# Patient Record
Sex: Male | Born: 1960 | Race: Black or African American | Hispanic: No | Marital: Single | Smoking: Current every day smoker
Health system: Southern US, Community
[De-identification: ages and names within clinical notes are randomized; demographics above are authoritative.]

## PROBLEM LIST (undated history)

## (undated) DIAGNOSIS — R079 Chest pain, unspecified: Secondary | ICD-10-CM

## (undated) DIAGNOSIS — N186 End stage renal disease: Secondary | ICD-10-CM

## (undated) DIAGNOSIS — R9431 Abnormal electrocardiogram [ECG] [EKG]: Secondary | ICD-10-CM

## (undated) DIAGNOSIS — I10 Essential (primary) hypertension: Secondary | ICD-10-CM

## (undated) DIAGNOSIS — F1910 Other psychoactive substance abuse, uncomplicated: Secondary | ICD-10-CM

## (undated) DIAGNOSIS — I517 Cardiomegaly: Secondary | ICD-10-CM

## (undated) DIAGNOSIS — E875 Hyperkalemia: Secondary | ICD-10-CM

## (undated) DIAGNOSIS — M1990 Unspecified osteoarthritis, unspecified site: Secondary | ICD-10-CM

## (undated) HISTORY — PX: AV FISTULA PLACEMENT: SHX1204

## (undated) HISTORY — PX: CHOLECYSTECTOMY: SHX55

---

## 2002-06-06 ENCOUNTER — Emergency Department (HOSPITAL_COMMUNITY): Admission: EM | Admit: 2002-06-06 | Discharge: 2002-06-06 | Payer: Self-pay

## 2002-10-31 ENCOUNTER — Inpatient Hospital Stay (HOSPITAL_COMMUNITY): Admission: EM | Admit: 2002-10-31 | Discharge: 2002-11-01 | Payer: Self-pay | Admitting: Emergency Medicine

## 2002-10-31 ENCOUNTER — Encounter: Payer: Self-pay | Admitting: Internal Medicine

## 2002-11-01 ENCOUNTER — Encounter: Payer: Self-pay | Admitting: Internal Medicine

## 2003-03-12 ENCOUNTER — Encounter: Payer: Self-pay | Admitting: Emergency Medicine

## 2003-03-12 ENCOUNTER — Inpatient Hospital Stay (HOSPITAL_COMMUNITY): Admission: EM | Admit: 2003-03-12 | Discharge: 2003-03-14 | Payer: Self-pay | Admitting: Emergency Medicine

## 2003-03-13 ENCOUNTER — Encounter: Payer: Self-pay | Admitting: Cardiology

## 2003-03-23 ENCOUNTER — Encounter: Admission: RE | Admit: 2003-03-23 | Discharge: 2003-03-23 | Payer: Self-pay | Admitting: Family Medicine

## 2003-05-26 ENCOUNTER — Emergency Department (HOSPITAL_COMMUNITY): Admission: EM | Admit: 2003-05-26 | Discharge: 2003-05-26 | Payer: Self-pay | Admitting: *Deleted

## 2003-12-18 ENCOUNTER — Inpatient Hospital Stay (HOSPITAL_COMMUNITY): Admission: EM | Admit: 2003-12-18 | Discharge: 2003-12-22 | Payer: Self-pay | Admitting: Emergency Medicine

## 2004-08-31 ENCOUNTER — Ambulatory Visit: Payer: Self-pay | Admitting: Family Medicine

## 2004-08-31 ENCOUNTER — Ambulatory Visit: Payer: Self-pay | Admitting: *Deleted

## 2004-09-15 ENCOUNTER — Ambulatory Visit: Payer: Self-pay | Admitting: Family Medicine

## 2004-09-21 ENCOUNTER — Ambulatory Visit: Payer: Self-pay | Admitting: Family Medicine

## 2004-10-12 ENCOUNTER — Ambulatory Visit: Payer: Self-pay | Admitting: Family Medicine

## 2004-11-08 ENCOUNTER — Ambulatory Visit: Payer: Self-pay | Admitting: Family Medicine

## 2004-11-30 ENCOUNTER — Ambulatory Visit: Payer: Self-pay | Admitting: Family Medicine

## 2004-12-01 ENCOUNTER — Ambulatory Visit: Payer: Self-pay | Admitting: Family Medicine

## 2005-01-12 ENCOUNTER — Ambulatory Visit: Payer: Self-pay | Admitting: Family Medicine

## 2005-03-02 ENCOUNTER — Ambulatory Visit: Payer: Self-pay | Admitting: Family Medicine

## 2005-05-10 ENCOUNTER — Ambulatory Visit: Payer: Self-pay | Admitting: Family Medicine

## 2005-06-04 ENCOUNTER — Emergency Department (HOSPITAL_COMMUNITY): Admission: EM | Admit: 2005-06-04 | Discharge: 2005-06-05 | Payer: Self-pay | Admitting: Emergency Medicine

## 2005-06-06 ENCOUNTER — Ambulatory Visit: Payer: Self-pay | Admitting: Family Medicine

## 2005-09-11 ENCOUNTER — Ambulatory Visit: Admission: RE | Admit: 2005-09-11 | Discharge: 2005-09-11 | Payer: Self-pay | Admitting: Vascular Surgery

## 2005-09-13 ENCOUNTER — Encounter (INDEPENDENT_AMBULATORY_CARE_PROVIDER_SITE_OTHER): Payer: Self-pay | Admitting: Specialist

## 2005-09-13 ENCOUNTER — Inpatient Hospital Stay (HOSPITAL_COMMUNITY): Admission: EM | Admit: 2005-09-13 | Discharge: 2005-09-14 | Payer: Self-pay | Admitting: Emergency Medicine

## 2005-12-22 ENCOUNTER — Ambulatory Visit: Payer: Self-pay | Admitting: Family Medicine

## 2006-02-16 ENCOUNTER — Inpatient Hospital Stay (HOSPITAL_COMMUNITY): Admission: EM | Admit: 2006-02-16 | Discharge: 2006-02-20 | Payer: Self-pay | Admitting: Emergency Medicine

## 2006-02-16 ENCOUNTER — Ambulatory Visit: Payer: Self-pay | Admitting: Family Medicine

## 2006-02-19 ENCOUNTER — Encounter (INDEPENDENT_AMBULATORY_CARE_PROVIDER_SITE_OTHER): Payer: Self-pay | Admitting: Interventional Cardiology

## 2006-02-21 ENCOUNTER — Ambulatory Visit: Payer: Self-pay | Admitting: Family Medicine

## 2006-05-14 ENCOUNTER — Emergency Department (HOSPITAL_COMMUNITY): Admission: EM | Admit: 2006-05-14 | Discharge: 2006-05-14 | Payer: Self-pay | Admitting: Emergency Medicine

## 2006-05-23 ENCOUNTER — Ambulatory Visit: Payer: Self-pay | Admitting: Family Medicine

## 2006-06-10 IMAGING — CR DG CHEST 2V
2 series · 2 of 2 positions shown · non-contrast
Comparison: 02/16/06.

CLINICAL DATA: Chest pain, shortness of breath.  ER patient.
 CHEST ? 2 VIEW:

[w chest pa]
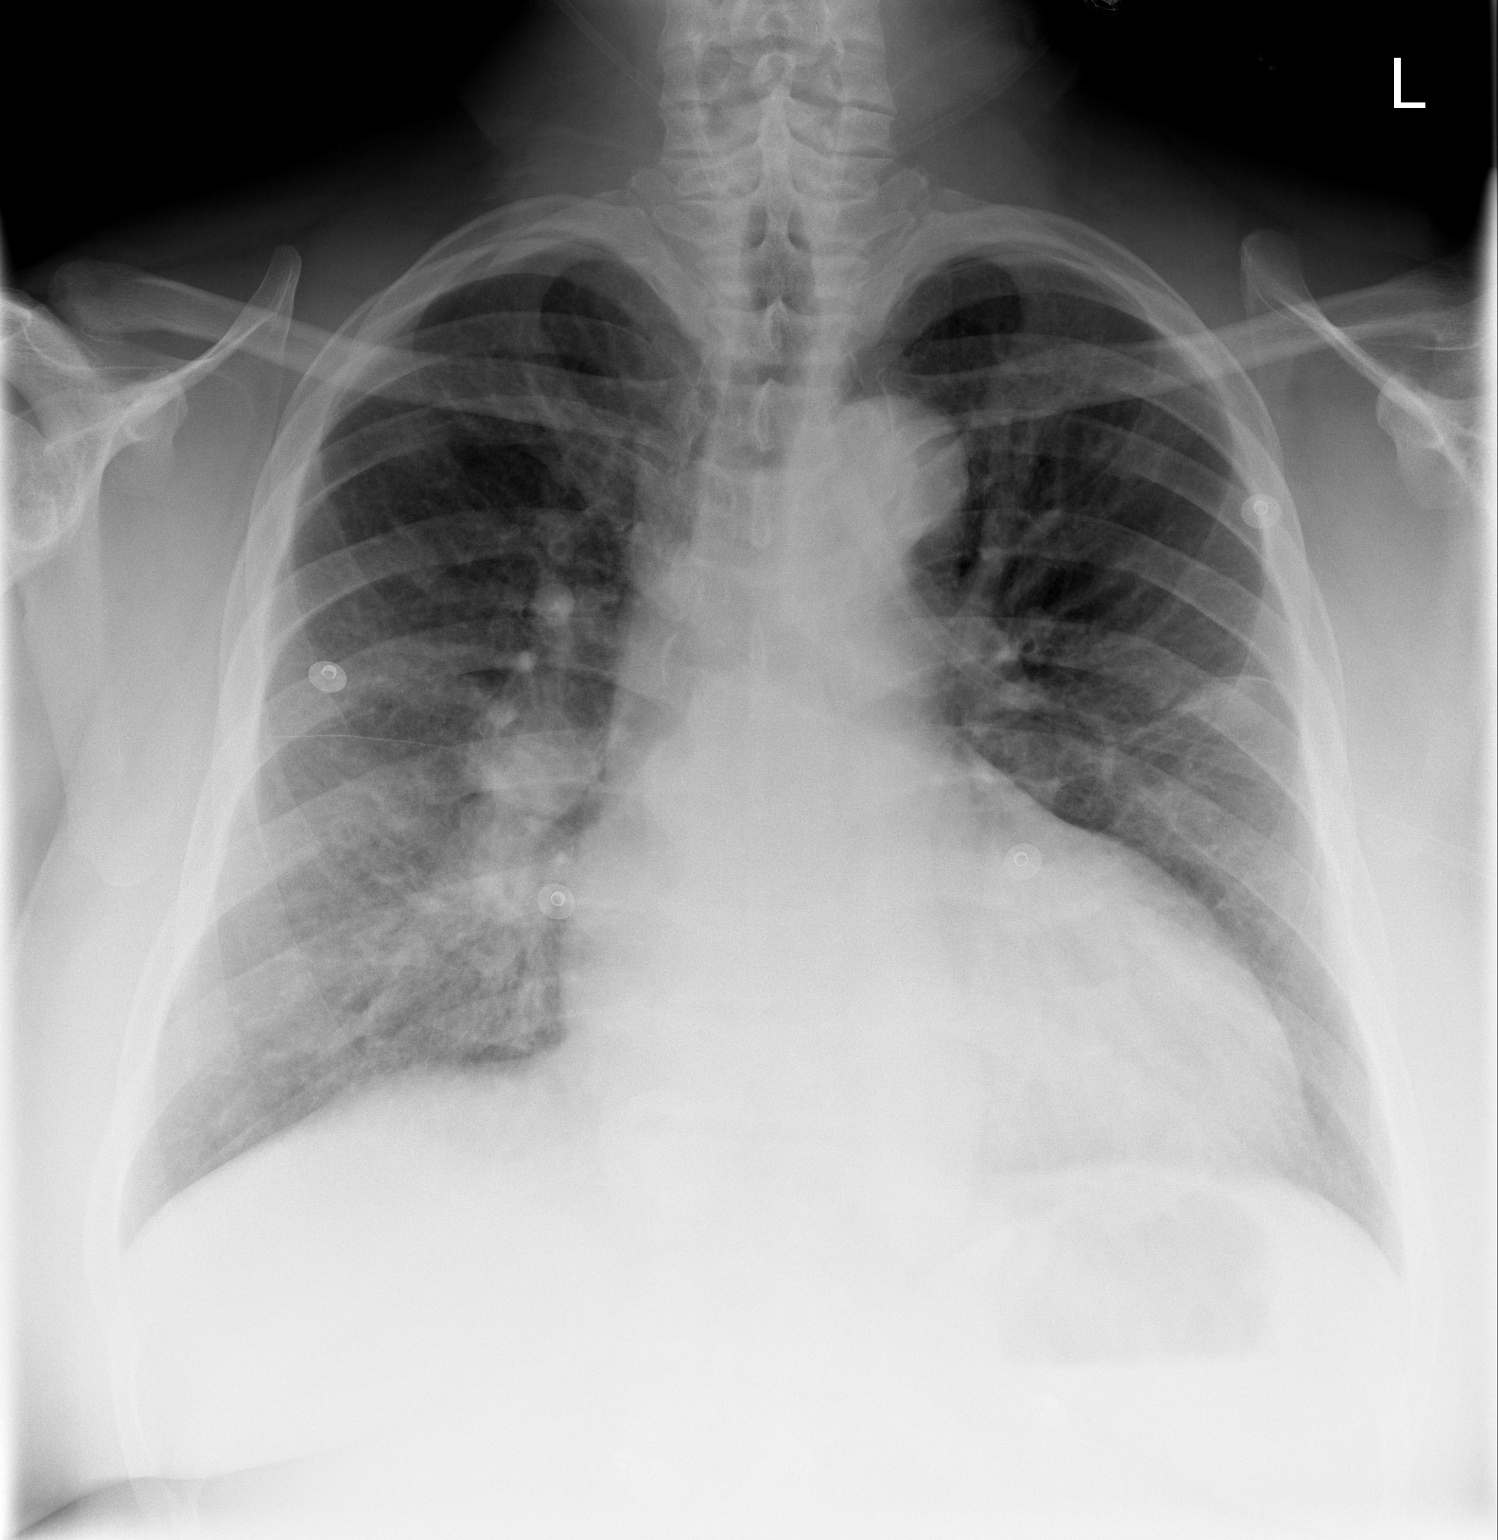

[w chest lat]
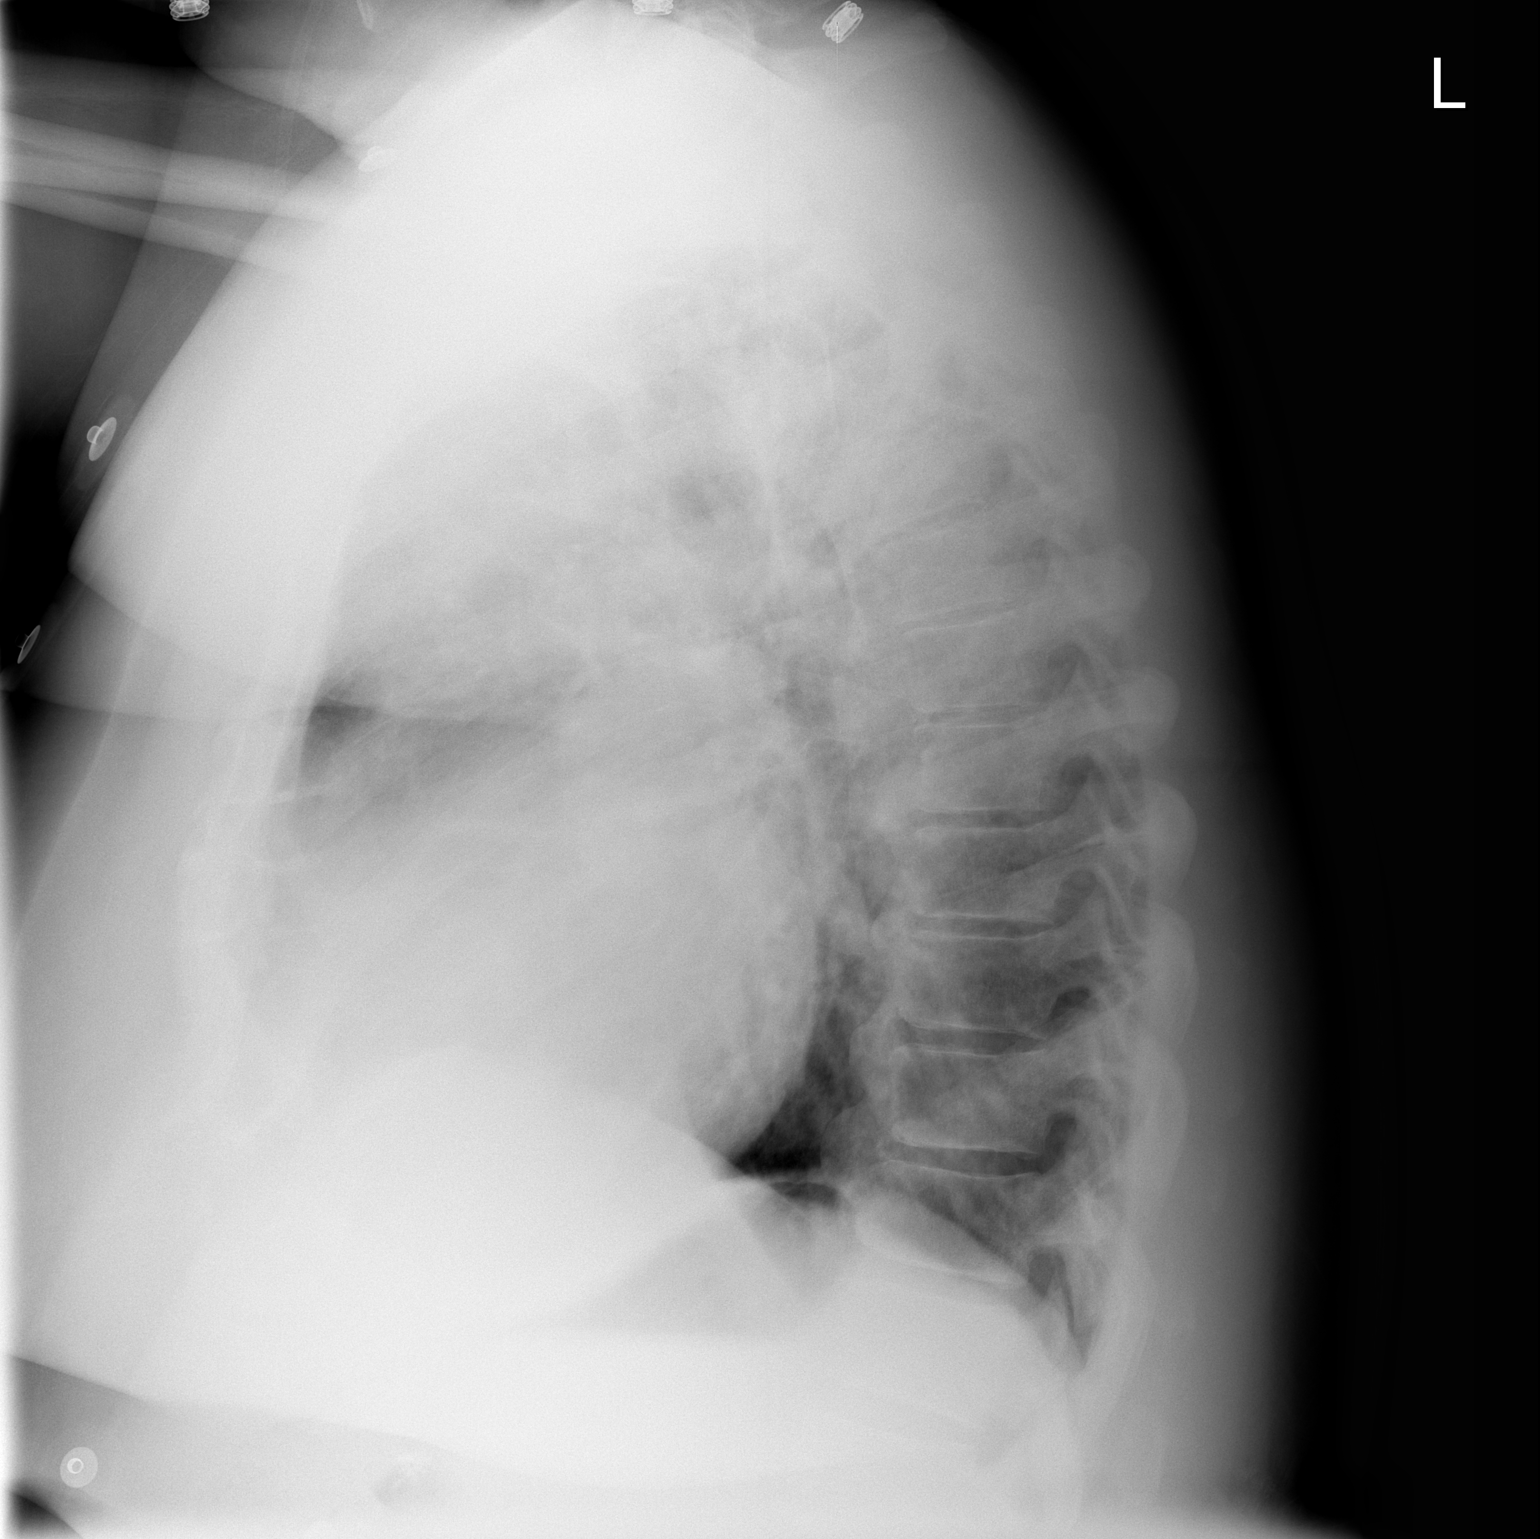

[2 of 2 positions shown; findings below may reference images not displayed]

FINDINGS: Cardiomegaly and pulmonary vascular congestion.  Minimal perivascular edema may be present.  Subsegmental atelectasis left versus scarring left mid lung zone.
IMPRESSION: Findings compatible with mild/incipient CHF.

## 2006-09-03 ENCOUNTER — Ambulatory Visit: Payer: Self-pay | Admitting: Family Medicine

## 2006-11-06 ENCOUNTER — Inpatient Hospital Stay (HOSPITAL_COMMUNITY): Admission: EM | Admit: 2006-11-06 | Discharge: 2006-11-09 | Payer: Self-pay | Admitting: Emergency Medicine

## 2007-05-03 ENCOUNTER — Ambulatory Visit (HOSPITAL_COMMUNITY): Admission: RE | Admit: 2007-05-03 | Discharge: 2007-05-03 | Payer: Self-pay | Admitting: Nephrology

## 2007-06-04 ENCOUNTER — Ambulatory Visit: Payer: Self-pay | Admitting: Vascular Surgery

## 2007-07-18 ENCOUNTER — Ambulatory Visit (HOSPITAL_COMMUNITY): Admission: RE | Admit: 2007-07-18 | Discharge: 2007-07-18 | Payer: Self-pay | Admitting: Nephrology

## 2007-08-23 ENCOUNTER — Ambulatory Visit: Payer: Self-pay | Admitting: Vascular Surgery

## 2007-08-27 ENCOUNTER — Ambulatory Visit (HOSPITAL_COMMUNITY): Admission: RE | Admit: 2007-08-27 | Discharge: 2007-08-27 | Payer: Self-pay | Admitting: Vascular Surgery

## 2007-08-27 ENCOUNTER — Ambulatory Visit: Payer: Self-pay | Admitting: Vascular Surgery

## 2007-09-11 ENCOUNTER — Encounter (INDEPENDENT_AMBULATORY_CARE_PROVIDER_SITE_OTHER): Payer: Self-pay | Admitting: *Deleted

## 2007-11-14 IMAGING — XA IR VENTRICULOPERITONEALSHUNT EVAL/INJ
1 series · 13 of 19 positions shown · non-contrast
Comparison: none

Addendum Begins
Patient returned for repeat injection. The fistula between the radial artery and
outflow cephalic vein is widely patent. Outflow vein is patent in the forearm.
The outflow vein bifurcates at the level of the elbow. Centrally, cephalic and
brachial outflow components are widely patent. The central venous structures are
unremarkable.
CLINICAL DATA: Difficult cannulation. Poor maturation.

[Series 1: run · 13 of 19 slices shown]
[im 1/19]
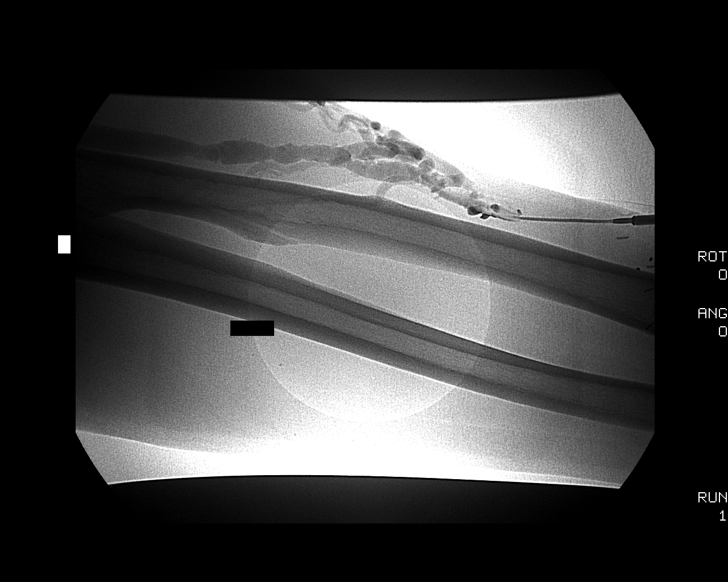
[im 3/19]
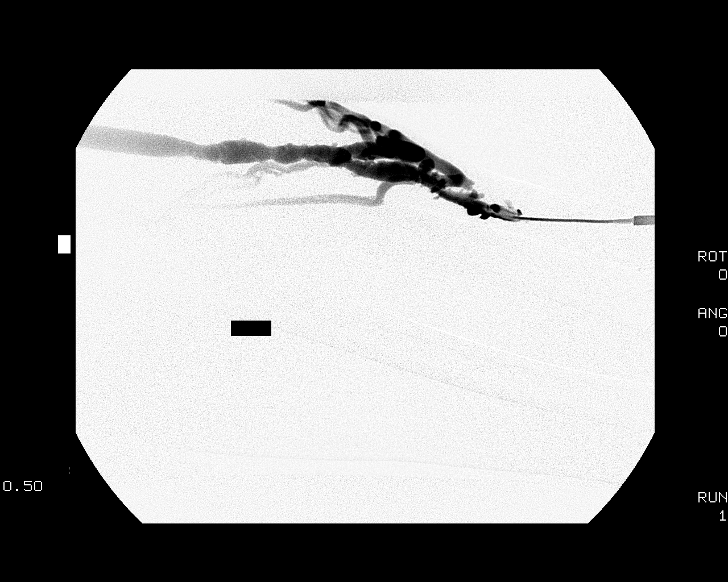
[im 4/19]
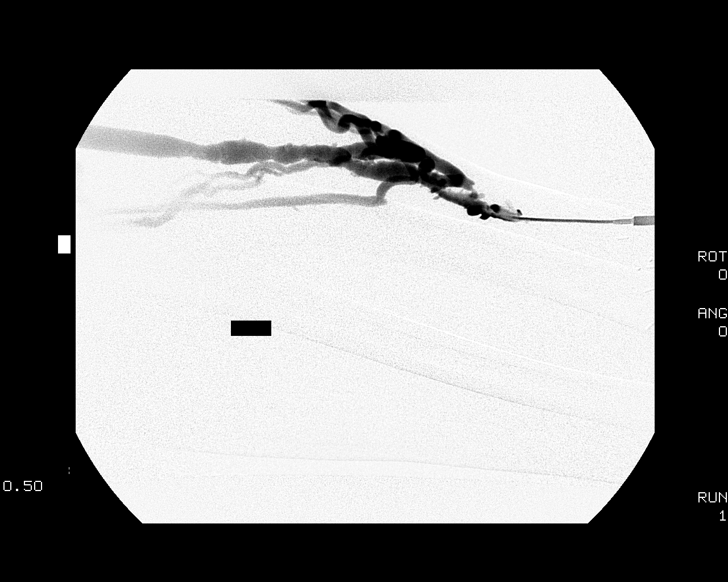
[im 6/19]
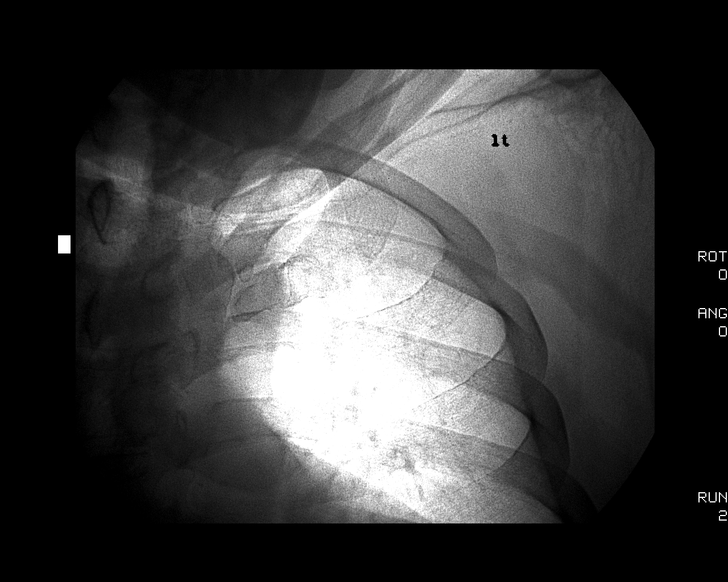
[im 7/19]
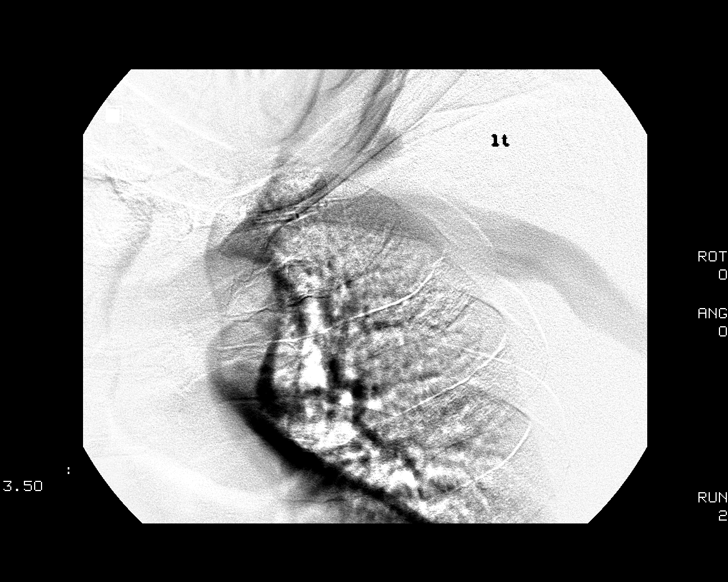
[im 9/19]
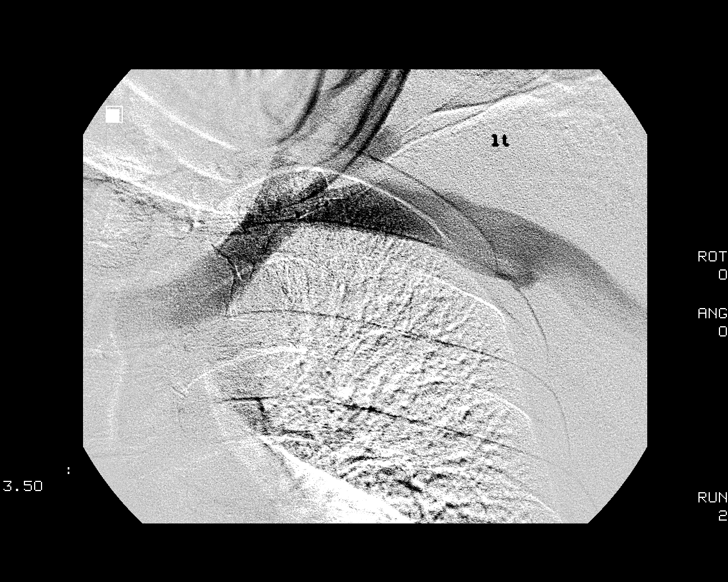
[im 10/19]
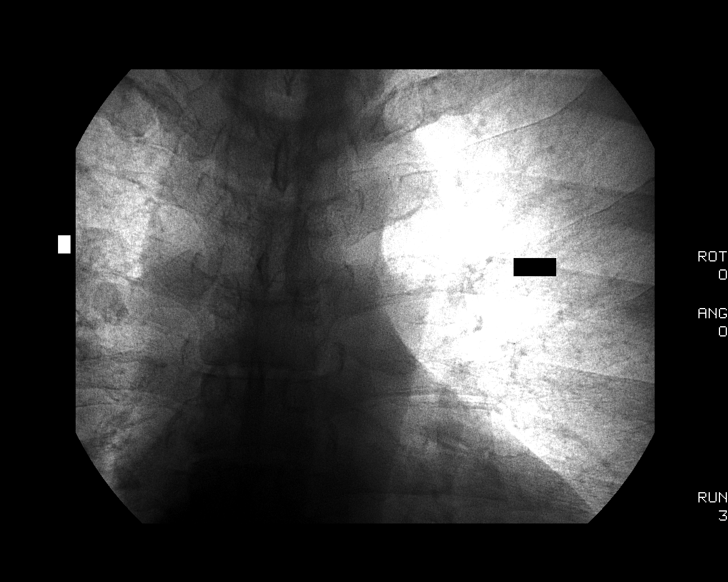
[im 11/19]
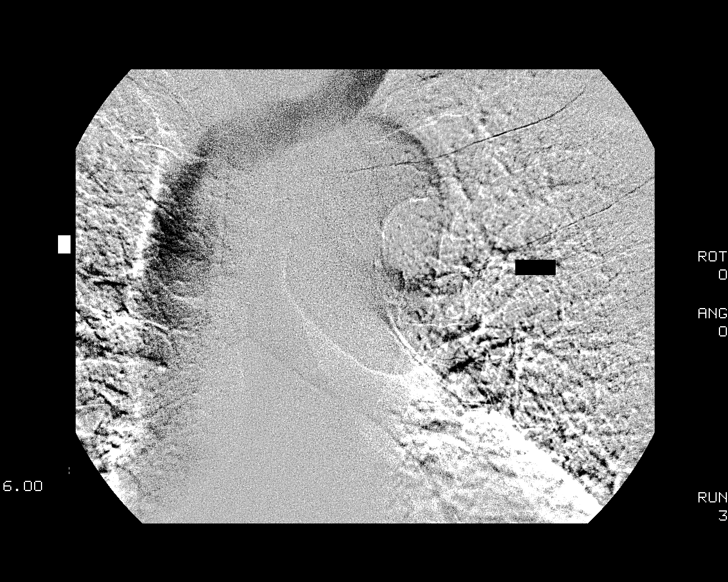
[im 13/19]
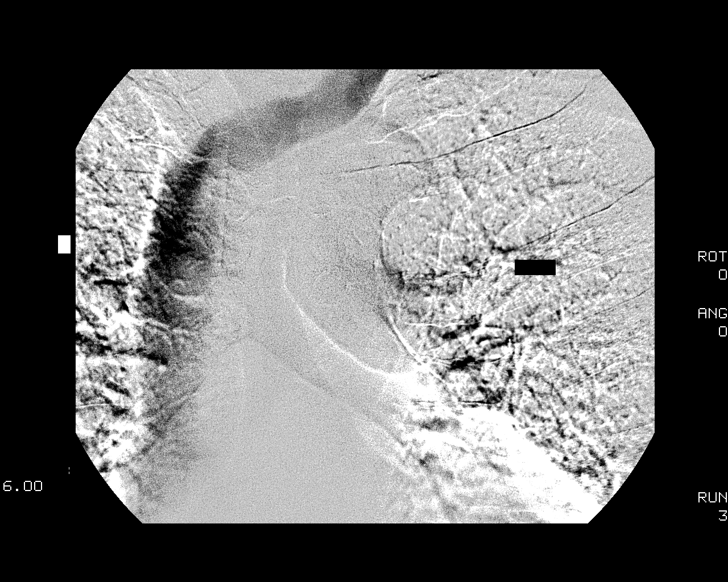
[im 14/19]
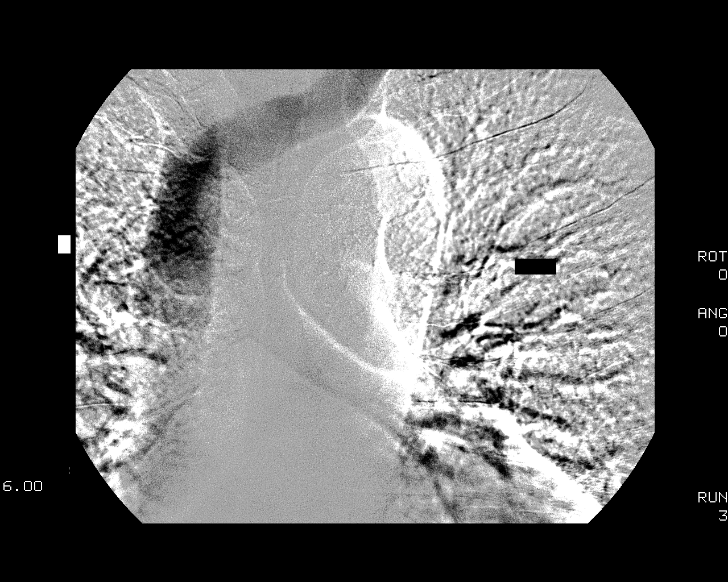
[im 16/19]
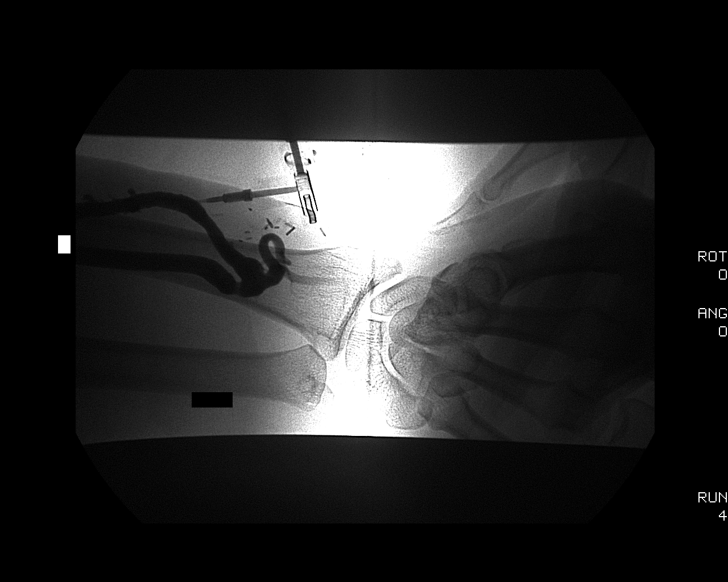
[im 17/19]
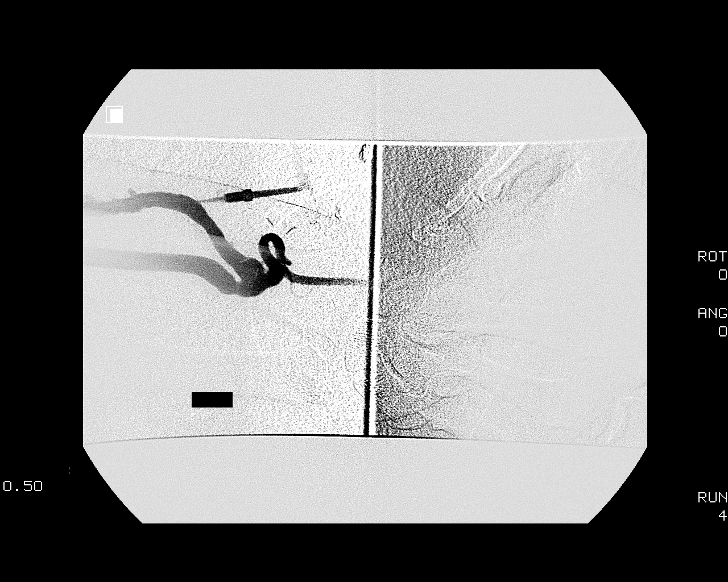
[im 19/19]
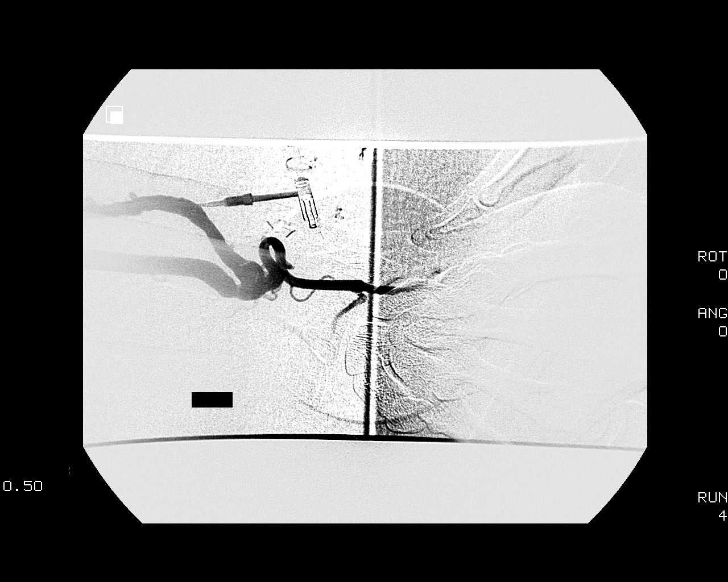

[13 of 19 positions shown; findings below may reference images not displayed]

IMPRESSION: 1. Patent left forearm fistula with no central venous stenosis or occlusion.
Addendum Ends
Dialysis fistulogram:

An 18-gauge angiocatheter was placed in outflow vein for fistulography.
Initially, there was a small amount of extravasation around the tip of the
angiocatheter which was repositioned. 
There is irregularity of the primary outflow   forearm vein. Vein bifurcates at
the level of the elbow into cephalic and basilic components which are widely
patent centrally. With larger in volume injection there was subcutaneous
extravasation of nearly the entire injection of 12 mL, so the angiocatheter was
removed. This extravasation obscures the region of the  fistula, which was not
evaluated.
IMPRESSION: 1. Limited evaluation due to contrast extravasation. Patient is scheduled to
return next week for completion of the fistulogram.

## 2008-01-02 ENCOUNTER — Ambulatory Visit (HOSPITAL_COMMUNITY): Admission: RE | Admit: 2008-01-02 | Discharge: 2008-01-02 | Payer: Self-pay | Admitting: Nephrology

## 2008-02-06 ENCOUNTER — Ambulatory Visit: Payer: Self-pay | Admitting: *Deleted

## 2008-03-05 ENCOUNTER — Inpatient Hospital Stay (HOSPITAL_COMMUNITY): Admission: EM | Admit: 2008-03-05 | Discharge: 2008-03-06 | Payer: Self-pay | Admitting: Emergency Medicine

## 2008-03-23 ENCOUNTER — Emergency Department (HOSPITAL_COMMUNITY): Admission: EM | Admit: 2008-03-23 | Discharge: 2008-03-23 | Payer: Self-pay | Admitting: Emergency Medicine

## 2008-04-09 ENCOUNTER — Ambulatory Visit (HOSPITAL_COMMUNITY): Admission: RE | Admit: 2008-04-09 | Discharge: 2008-04-09 | Payer: Self-pay | Admitting: Vascular Surgery

## 2008-08-06 ENCOUNTER — Emergency Department (HOSPITAL_COMMUNITY): Admission: EM | Admit: 2008-08-06 | Discharge: 2008-08-06 | Payer: Self-pay | Admitting: Emergency Medicine

## 2010-03-08 ENCOUNTER — Ambulatory Visit (HOSPITAL_COMMUNITY): Admission: RE | Admit: 2010-03-08 | Discharge: 2010-03-08 | Payer: Self-pay | Admitting: General Surgery

## 2010-04-26 ENCOUNTER — Ambulatory Visit (HOSPITAL_COMMUNITY): Admission: RE | Admit: 2010-04-26 | Discharge: 2010-04-26 | Payer: Self-pay | Admitting: General Surgery

## 2010-11-24 ENCOUNTER — Ambulatory Visit (HOSPITAL_COMMUNITY)
Admission: RE | Admit: 2010-11-24 | Discharge: 2010-11-24 | Payer: Self-pay | Source: Home / Self Care | Admitting: Surgery

## 2011-01-15 ENCOUNTER — Encounter: Payer: Self-pay | Admitting: Nephrology

## 2011-03-06 LAB — CBC
HCT: 34.6 % — ABNORMAL LOW (ref 39.0–52.0)
Hemoglobin: 11.4 g/dL — ABNORMAL LOW (ref 13.0–17.0)
RBC: 3.96 MIL/uL — ABNORMAL LOW (ref 4.22–5.81)
RDW: 14.6 % (ref 11.5–15.5)
WBC: 9.3 10*3/uL (ref 4.0–10.5)

## 2011-03-06 LAB — DIFFERENTIAL
Basophils Absolute: 0 10*3/uL (ref 0.0–0.1)
Eosinophils Absolute: 0.2 10*3/uL (ref 0.0–0.7)
Lymphocytes Relative: 22 % (ref 12–46)
Lymphs Abs: 2 10*3/uL (ref 0.7–4.0)
Monocytes Absolute: 0.6 10*3/uL (ref 0.1–1.0)
Monocytes Relative: 6 % (ref 3–12)

## 2011-03-06 LAB — BASIC METABOLIC PANEL
CO2: 30 mEq/L (ref 19–32)
Calcium: 9.7 mg/dL (ref 8.4–10.5)
Chloride: 95 mEq/L — ABNORMAL LOW (ref 96–112)
GFR calc Af Amer: 7 mL/min — ABNORMAL LOW (ref >60)
Glucose, Bld: 97 mg/dL (ref 70–99)

## 2011-03-06 LAB — PROTIME-INR: Prothrombin Time: 15.1 seconds (ref 11.6–15.2)

## 2011-03-19 LAB — CBC
HCT: 32.3 % — ABNORMAL LOW (ref 39.0–52.0)
Hemoglobin: 10.9 g/dL — ABNORMAL LOW (ref 13.0–17.0)
MCHC: 33.9 g/dL (ref 30.0–36.0)
MCV: 90.2 fL (ref 78.0–100.0)
Platelets: 180 10*3/uL (ref 150–400)
RBC: 3.58 MIL/uL — ABNORMAL LOW (ref 4.22–5.81)
RDW: 14 % (ref 11.5–15.5)
WBC: 8.4 10*3/uL (ref 4.0–10.5)

## 2011-03-19 LAB — DIFFERENTIAL
Basophils Relative: 1 % (ref 0–1)
Eosinophils Absolute: 0.2 10*3/uL (ref 0.0–0.7)
Lymphocytes Relative: 21 % (ref 12–46)
Lymphs Abs: 1.7 10*3/uL (ref 0.7–4.0)

## 2011-03-19 LAB — BASIC METABOLIC PANEL
Calcium: 9.2 mg/dL (ref 8.4–10.5)
Chloride: 98 mEq/L (ref 96–112)
Creatinine, Ser: 10.52 mg/dL — ABNORMAL HIGH (ref 0.4–1.5)
GFR calc Af Amer: 6 mL/min — ABNORMAL LOW (ref >60)
GFR calc non Af Amer: 5 mL/min — ABNORMAL LOW (ref >60)
Potassium: 4.8 mEq/L (ref 3.5–5.1)

## 2011-03-19 LAB — HEPATIC FUNCTION PANEL
ALT: 13 U/L (ref 0–53)
Albumin: 3.9 g/dL (ref 3.5–5.2)
Bilirubin, Direct: 0.2 mg/dL (ref 0.0–0.3)
Indirect Bilirubin: 0.3 mg/dL (ref 0.3–0.9)
Total Bilirubin: 0.5 mg/dL (ref 0.3–1.2)

## 2011-04-20 ENCOUNTER — Ambulatory Visit (INDEPENDENT_AMBULATORY_CARE_PROVIDER_SITE_OTHER): Payer: Medicare Other

## 2011-04-20 DIAGNOSIS — N186 End stage renal disease: Secondary | ICD-10-CM

## 2011-04-20 DIAGNOSIS — T82898A Other specified complication of vascular prosthetic devices, implants and grafts, initial encounter: Secondary | ICD-10-CM

## 2011-04-20 NOTE — H&P (Signed)
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  HISTORY OF PRESENT ILLNESS:  The patient is a 50 year old gentleman who had an arteriovenous fistula brachiocephalic placed in the left upper extremity in 2008 by Dr. Arbie Cookey.  He has done well with this.  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 The more proximal pseudoaneurysm had an area recently has been scabbing over and bleeding and the skin over this area is very thin.  He was sent to [place] today for possible resection of pseudoaneurysm in the left upper arm AV fistula.  PAST MEDICAL HISTORY: 1. Significant for a very high blood pressure which is not well     controlled. 2. End-stage renal disease.  SOCIAL HISTORY:  He is single.  He still smokes less than a pack of cigarettes per day.  FAMILY HISTORY:  Is noncontributory.  REVIEW OF SYSTEMS:  He denies any weight loss or weight gain.  He denies any strokes, mini strokes.  He does get some shortness of breath with exertion but denies any chest pain. GASTROINTESTINAL: He denies any black tarry stools or blood in stools. NEUROLOGICALLY:  He denies any dizziness or blackouts.  However, he does state that he has his blood pressure drops into the 120s, he does have some episodes of lightheadedness. PULMONARY:  He denies any home oxygen and productive cough.  He does say that recently he has been had a dry cough without any fever or chills. URINARY:  He has kidney disease and frequent urination. MUSCULOSKELETAL:  He has some arthritis and joint pain.  The patient does not have a list of his medications.  However, he states he takes Norvasc for his blood pressure.  PHYSICAL EXAM:  This is a overweight gentleman in no acute distress. HEENT is grossly within normal limits.  His heart  rate was 91.  His blood pressure was 184/124.  His O2 sats were 98 and his respiratory rate was 12.  His lungs were clear.  His heart rate and rhythm was regular.  Left upper extremity AV fistula had a good thrill and bruit. The vein was dilated in the area of the anastomosis and then he had two large pseudoaneurysms proximal to that.  The most proximal aneurysm had an area of thin skin.  These pseudoaneurysms were pulsatile.  He had positive radial pulse.  He had good grip in bilateral upper extremities. He had 2+ radial pulses bilaterally.  Of note, I spoke to the R.N. at the [facility] and she states that the patient's blood pressure is usually anywhere from 200/125 to 190/110 and that this is not something new for him.  ASSESSMENT/PLAN:  Assessment is a 50 year old gentleman with end-stage renal disease who has a 25-year-old left brachial cephalic AV fistula, which was placed in 2008, which now has 2 large pseudoaneurysms, one of which has a thin layer skin over the aneurysm and has developed a scab and has potential for bleeding.  The plan is to have a resection of the pseudoaneurysm and revision of the AV fistula with either replacement of Gore-Tex graft or resection of the pseudoaneurysm, and this will be done by Dr. Arbie Cookey.  Della Goo, PA-C  [name] E. Fields, MD Electronically Signed  RR/MEDQ  D:  04/20/2011  T:  04/20/2011  Job:  [id number]

## 2011-05-04 ENCOUNTER — Other Ambulatory Visit: Payer: Self-pay | Admitting: Vascular Surgery

## 2011-05-04 ENCOUNTER — Ambulatory Visit (HOSPITAL_COMMUNITY)
Admission: RE | Admit: 2011-05-04 | Discharge: 2011-05-04 | Disposition: A | Discharge status: Home / Self Care | Payer: Medicare Other | Source: Ambulatory Visit | Attending: Vascular Surgery | Admitting: Vascular Surgery

## 2011-05-04 ENCOUNTER — Ambulatory Visit (HOSPITAL_COMMUNITY): Payer: Medicare Other

## 2011-05-04 DIAGNOSIS — I120 Hypertensive chronic kidney disease with stage 5 chronic kidney disease or end stage renal disease: Secondary | ICD-10-CM | POA: Insufficient documentation

## 2011-05-04 DIAGNOSIS — E039 Hypothyroidism, unspecified: Secondary | ICD-10-CM | POA: Insufficient documentation

## 2011-05-04 DIAGNOSIS — N186 End stage renal disease: Secondary | ICD-10-CM

## 2011-05-04 DIAGNOSIS — I721 Aneurysm of artery of upper extremity: Secondary | ICD-10-CM | POA: Insufficient documentation

## 2011-05-04 DIAGNOSIS — I440 Atrioventricular block, first degree: Secondary | ICD-10-CM | POA: Insufficient documentation

## 2011-05-04 DIAGNOSIS — Y832 Surgical operation with anastomosis, bypass or graft as the cause of abnormal reaction of the patient, or of later complication, without mention of misadventure at the time of the procedure: Secondary | ICD-10-CM | POA: Insufficient documentation

## 2011-05-04 DIAGNOSIS — T82898A Other specified complication of vascular prosthetic devices, implants and grafts, initial encounter: Secondary | ICD-10-CM

## 2011-05-04 DIAGNOSIS — R0602 Shortness of breath: Secondary | ICD-10-CM | POA: Insufficient documentation

## 2011-05-04 DIAGNOSIS — Z01812 Encounter for preprocedural laboratory examination: Secondary | ICD-10-CM | POA: Insufficient documentation

## 2011-05-04 DIAGNOSIS — F17200 Nicotine dependence, unspecified, uncomplicated: Secondary | ICD-10-CM | POA: Insufficient documentation

## 2011-05-04 DIAGNOSIS — Z01818 Encounter for other preprocedural examination: Secondary | ICD-10-CM | POA: Insufficient documentation

## 2011-05-04 DIAGNOSIS — I509 Heart failure, unspecified: Secondary | ICD-10-CM | POA: Insufficient documentation

## 2011-05-04 LAB — SURGICAL PCR SCREEN: MRSA, PCR: NEGATIVE

## 2011-05-04 LAB — POCT I-STAT 4, (NA,K, GLUC, HGB,HCT): Glucose, Bld: 91 mg/dL (ref 70–99)

## 2011-05-09 NOTE — Assessment & Plan Note (Signed)
OFFICE VISIT   [name] [name], [name] [name] N  DOB:  [birth year]                                       06/04/2007  EAVWU#:[id number]   The patient returns today having some difficulties at the dialysis  center utilizing his left forearm.  AV fistula was created by Dr. Colin Benton  in September of 2006 and has worked nicely over the years.  He states  only one technologist at the dialysis center is able to stick it  appropriately but that particular person is not having any difficulty.  A fistulogram was performed in May of this year which I have reviewed  and reveals the fistula to be widely patent with no evidence of central  vein occlusion or any technical problems related to the fistula in the  forearm.   EXAMINATION:  The pulse has an excellent pulse and a palpable thrill and  a well-profused left hand with no evidence of any steal.  I do not know the particulars of why only one individual can use this  fistula, but it seems very superficial, non-tortuous, and seems to be  functioning well and I would continue to have that individual utilize  the fistula as there is nothing to revise.  If we can be of further  assistance in that we will be happy to see this patient in the future.   Quita Skye Hart Rochester, M.D.  Electronically Signed   JDL/MEDQ  D:  06/04/2007  T:  06/04/2007  Job:  [id number]   cc:   Mindi Slicker. Lowell Guitar, M.D.

## 2011-05-09 NOTE — Assessment & Plan Note (Signed)
OFFICE VISIT   MOHD., [name] [name]  DOB:  28-Nov-1961                                       02/06/2008  YQMVH#:[id number]   Patient had a left brachiocephalic arteriovenous fistula, created by Dr.  Arbie Cookey on 08/27/07.  The fistula is now fully matured.  A recent  fistulogram reveals the proximal fistula to be well-developed,  tortuated, dilated cephalic vein noted as the dominant outflow.  Central  veins are patent.  There is a single competing vein noted in the upper  arm.   The left upper arm AV fistula has never been accessed.   BP is 177/103, pulse 87, respirations 16 per minute.  Patient is alert  and oriented in no acute distress.  Left arm evaluation does reveal a  patent fistula with some tortuosity; however, the vein is quite  prominent and appears to be readily accessible for cannulation.   Recommend at this time to go ahead with cannulation of left upper arm AV  fistula.  If problems are encountered, would then consider revision and  ligation of competing vein.   Balinda Quails, M.D.  Electronically Signed   PGH/MEDQ  D:  02/06/2008  T:  02/08/2008  Job:  [id number]   cc:   [facility]

## 2011-05-09 NOTE — Op Note (Signed)
NAMEERRIN, [name] [name]                ACCOUNT NO.:  [id number]   MEDICAL RECORD NO.:  [id number]          PATIENT TYPE:  AMB   LOCATION:  SDS                          FACILITY:  [facility]   PHYSICIAN:  Larina Earthly, M.D.    DATE OF BIRTH:  Apr 03, 1961   DATE OF PROCEDURE:  08/27/2007  DATE OF DISCHARGE:                               OPERATIVE REPORT   PREOPERATIVE DIAGNOSIS:  End-stage renal disease with poorly-functioning  left forearm arteriovenous fistula.   POSTOPERATIVE DIAGNOSIS:  End-stage renal disease with poorly-  functioning left forearm arteriovenous fistula.   PROCEDURE:  Placement of new left upper arm arteriovenous fistula and  right internal jugular Diatek catheter with ultrasound visualization.   SURGEON:  Larina Earthly, M.D.   ASSISTANT:  Nurse.   ANESTHESIA:  MAC.   COMPLICATIONS:  None.   DISPOSITION:  To recovery room with chest x-ray pending.   PROCEDURE IN DETAIL:  The patient was taken to operating room placed in  supine position, where the area of the left arm was prepped in the usual  sterile fashion.  Incision made over the antecubital space, carried down  to isolate the antecubital vein, the basilic and cephalic veins.  The  patient had a forearm cephalic fistula which had failed.  The  antecubital vein was quite large.  The brachial artery was also of very  good caliber.  The vein was mobilized, was ligated distally and divided,  and tributary branches were ligated with 2-0 silk ties and divided.  The  vein was mobilized to the level of the brachial artery.  The brachial  artery was occluded proximally and distally and was opened with an 11  blade and extended longitudinally with Potts scissors.  The vein was cut  to appropriate length, spatulated and sewn end-to-side to the artery  with a running 6-0 Prolene suture.  The clamps were removed and an  excellent thrill was noted.  The wounds were irrigated with saline,  hemostasis with electrocautery.   The wounds were closed with 3-0 Vicryl  in the subcutaneous and subcuticular tissue, Benzoin and Steri-Strips  were applied.  Next the patient's right and left neck were imaged with  ultrasound, revealing widely patent jugular veins bilaterally.  The  patient was placed in Trendelenburg position and the right and left neck  and chest prepped and draped in the usual sterile fashion.  Local  anesthesia and a finder needle was used identify the right internal  jugular vein.  Next using Seldinger technique, a guidewire was passed  down to the level of the right atrium.  This was confirmed with  fluoroscopy.  A dilator and peel-away sheath was passed over the  guidewire and the dilator and guidewire were removed.  A 32-cm Diatek  catheter was passed through the peel-away sheath, which was removed.  The catheter was brought through a subcutaneous tunnel through a  separate stab incision.  The catheter was placed at the level of the  distal right atrium.  The two lumen ports were attached and both lumens  flushed and aspirated easily.  They  were locked with 1000/mL heparin.  The catheter was secured to the  skin with 3-0 nylon stitch and the entry sites with a 4-0 subcuticular  Vicryl stitch.  A sterile dressing was applied.  The patient was taken  to the recovery room, where chest a x-ray is pending.      Larina Earthly, M.D.  Electronically Signed     TFE/MEDQ  D:  08/27/2007  T:  08/27/2007  Job:  [id number]

## 2011-05-09 NOTE — Discharge Summary (Signed)
[name] [name], [name] [name]                ACCOUNT NO.:  [id number]   MEDICAL RECORD NO.:  [id number]          PATIENT TYPE:  INP   LOCATION:  6731                         FACILITY:  [facility]   PHYSICIAN:  Cecille Aver, M.D.DATE OF BIRTH:  Oct 18, 1961   DATE OF ADMISSION:  03/05/2008  DATE OF DISCHARGE:  03/06/2008                               DISCHARGE SUMMARY   DISCHARGE DIAGNOSES:  1. Noncompliance with dialysis and the medications.  2. End-stage renal disease, on hemodialysis; Monday, Wednesday, and      Friday at [facility].  3. Hypertension, uncontrolled, because of noncompliance.  4. Anemia.  5. Secondary hyperparathyroidism.  6. History of congestive heart failure with an ejection fraction of 45-      55%, February 2007.  7. History of substance abuse and recent cocaine abuse.   DISCHARGE MEDICATIONS:  1. Norvasc 10 mg 1 tablet p.o. daily.  2. Lisinopril 10 mg 1 tablet p.o. daily.  3. Nephro-Vite 1 tablet p.o. daily.  4. Tums 1 tablet 3 times a day with meals.  5. Zocor 20 mg p.o. daily.   FOLLOWUP APPOINTMENT:  The patient is to follow up on Monday to have  dialysis at the [facility].  Discharge instructions,  medications, and hemodialysis orders were faxed to the [facility] on March 06, 2008, as well as discharge nurse was called and  notified.   DISCHARGE INSTRUCTIONS:  The patient was advised to stop cocaine use,  also to be more compliant with his medications and his dialysis.   HOSPITAL COURSE:  The patient is a 50 year old African American male  with a history of end-stage renal disease on hemodialysis, hypertension,  noncompliance with dialysis and medications with an EF of 45-55%.  He  was admitted on March 05, 2008 due to not having dialysis for  approximately 10 days and feeling short of breath.  1. Shortness of breath, this resolved after hemodialysis on the day of      admission, March 05, 2008.  He denies any shortness  of breath or      chest pain at time of discharge, this was likely due to his      noncompliance with dialysis as well as his uncontrolled      hypertension.  The patient does have cardiac enzymes that were      done.  The highest troponin was 0.07.  Dr. Kathrene Bongo was aware      of this and feels the patient is able to be discharged.  2. End-stage renal disease on hemodialysis.  The patient was dialyzed      on March 05, 2008 and will be dialyzed again on March 06, 2008      prior to his discharge.  He will continue with Monday, Wednesday,      and Friday dialysis at the [facility], there where he      has been discharged and his discharge instructions have been faxed.      He will be on a 2 K bath for 4 hours  of dialysis.  EDW is  132 kg      via left arm fistula.  He will be receiving standard heparin and      Epogen 4400 units Monday, Wednesday, and Friday, and Zemplar 25 mcg      Monday, Wednesday, and Friday, VFR 400, DFR 800.  3. Hypertension.  Blood pressure was uncontrolled at time of admission      up to the 200s/110s.  This is also due to noncompliance with his      hemodialysis from causing fluid overload as well as noncompliance      with his medications.  He does not know what medications he is      taking.  He was started here on Norvasc 10 mg daily, lisinopril 10      mg daily, and his blood pressure subsequently decreased to 119-      138/70s-80s after dialysis and after medications were given.  He      will be continued on these medications.  We will attempt to see if      we can get social work to supply him a few days of medications when      he goes home.  4. Anemia.  The patient was continued on his Epogen 4400 units Monday,      Wednesday, and Friday which was converted to Aranesp  by the      pharmacist here, while he was in the hospital.  5. Secondary hyperparathyroidism.  The patient was continued on his      Zemplar 25 mcg Monday, Wednesday, and  Friday.  His phosphorus was      4.9, slightly elevated, and his calcium was 8.7 here in the      hospital.  6. CHF.  The patient has a history of an echo in February 2007 with EF      of 45-55% likely secondary to crack cocaine use and uncontrolled      hypertension.  He continued to be not short of breath.  There was      minimal edema here after his dialysis was done.  I believe this      would be pretty well controlled, if he is not using crack and if he      was not missing his dialysis treatments.  7. Cocaine abuse.  The patient was advised to stop this.   LABORATORY DATA:  White blood cell count 9.1, hemoglobin 11.7, and  platelets 169.  Sodium 138, potassium 4.5, chloride 111, bicarb 16,  glucose 98, BUN 82, creatinine 13, calcium 8.7, phosphorus 4.9, and  albumin 3.7.  Cardiac enzymes showed a slight elevation in CK-MB of 4.6  and a slight elevation in troponin of 0.07.      Alanda Amass, M.D.  Electronically Signed     ______________________________  Cecille Aver, M.D.    JH/MEDQ  D:  03/06/2008  T:  03/06/2008  Job:  [id number]

## 2011-05-09 NOTE — Assessment & Plan Note (Signed)
OFFICE VISIT   [name] [name], [name] [name]  DOB:  06/10/1961                                       08/23/2007  EAVWU#:[id number]   SUBJECTIVE:  The patient presents today for continued followup of his AV  access.  He had a Cimino left wrist fistula placed by me in September of  2006.  He has had difficulty over the past several months with access of  this.  He has required multiple sticksand has long run times.   OBJECTIVE:  I reviewed his fistulogram from May and the most recent one  on 07/18/2007.  This does reveal sclerotic area throughout the mid to  distal forearm.  He has good outflow into a large upper arm cephalic  vein and also flow into the basilic vein.  His upper arm basilic vein is  well-developed, although it does run slightly deep.   ASSESSMENT/PLAN:  I  had a long discussion with the patient and the  options of continuing to use his current fistula, which is not an  acceptable option due to the reason stated.  Also, explained the option  of new left upper arm fistula with a Diatek catheter or potentially  placing a right fistula.  My recommendation is for an upper arm fistula  since his upper cephalic veins already developed since he has had two  years of outflow from the forearm cephalic vein.  I explained that he  would require dialysis access catheter.  He is quite resistant to a  catheter due to the appearance of the catheter.  I explained that this  is for short term and really is the only option if we need new access.  I did also explain the outside chance of being able to continue to  maintain outflow to the basilic vein on the left and create an upper arm  cephalic vein fistula with continued patency of the forearm cephalic  vein fistula.  I explained that we would make this call at surgery and  would only do this if it would not compromise the upper arm fistula.  He  understands and agrees for a new left arm fistula and new Diatek  catheter.   We will proceed with this on his next nondialysis day on  August 27, 2007.   Larina Earthly, M.D.  Electronically Signed   TFE/MEDQ  D:  08/23/2007  T:  08/27/2007  Job:  [id number]   cc:   Terrial Rhodes, M.D.  [facility]
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
## 2011-05-12 NOTE — Op Note (Signed)
NAMETYSHAN, ENDERLE                ACCOUNT NO.:  [id number]   MEDICAL RECORD NO.:  [id number]          PATIENT TYPE:  INP   LOCATION:  5501                         FACILITY:  [facility]   PHYSICIAN:  Leonie Man, M.D.   DATE OF BIRTH:  1961-10-21   DATE OF PROCEDURE:  09/13/2005  DATE OF DISCHARGE:                                 OPERATIVE REPORT   PREOPERATIVE DIAGNOSES:  Acute cholecystitis.   POSTOPERATIVE DIAGNOSIS:  Chronic calculous cholecystitis with biliary  colic.   PROCEDURE:  Laparoscopic cholecystectomy with intraoperative cholangiogram.   SURGEON:  Leonie Man, M.D.   ASSISTANT:  Jimmye Norman, M.D.   ANESTHESIA:  General.   NOTE:  The patient is a 50 year old obese man who presents with epigastric  and right upper quadrant pain associated with nausea.  The patient has a  history of end-stage renal disease, being prepared for dialysis.  On  evaluation, he was noted to have cholelithiasis.  He had a bilirubin that  was elevated to 1.6; his lipase was normal.  He had mildly elevated alkaline  phosphatase, AST and ALT levels.  The patient comes to the operating room  after the risks and potential benefits of surgery had been discussed, all  questions answered and consent obtained.   PROCEDURE:  Following the induction of satisfactory general anesthesia with  the patient positioned supinely, the abdomen is prepped and draped  routinely, open laparoscopy created at the umbilicus with insertion of a  Hasson cannula and insufflation of the peritoneal cavity to 14 mmHg  pressure.  Visual exploration of the abdomen was carried out.  The  gallbladder appeared to be chronically scarred, but with multiple adhesions  up to the wall of gallbladder, but there was no acute inflammation noted.  Under direct vision, the epigastric and lateral ports were placed.  The  gallbladder was grasped and retracted cephalad, and dissection carried down  to the region of the ampulla, taking  down adhesions as we went.  The cystic  duct was then isolated and traced up to the cystic duct-gallbladder  junction.  The cystic duct was clipped proximally and opened.  A cystic duct  cholangiogram was carried out by passing a Cook catheter into the abdomen  and into the cystic duct and under fluoroscopic guidance, injecting 1/2  strength of 60% Hypaque into the biliary system.  The resulting  cholangiogram showed a prompt flow of contrast into the duodenum.  There  were no defects within any of the intrahepatic or extrahepatic ducts.  The  cholangiocatheter was withdrawn and the cystic duct was triply clipped and  transected.  The cystic artery was isolated and triply clipped and  transected.  The gallbladder was then dissected free from the liver bed  using electrocautery and maintaining hemostasis along the course of the  dissection.  At the end of the dissection, the gallbladder was placed in an  Endopouch.  The right upper quadrant was then thoroughly irrigated with  normal saline and aspirated, all areas of dissection checked for hemostasis  and noted to be dry.  The gallbladder was then retrieved  through the  umbilical port without difficulty.  The sponge, instrument and sharp counts  were verified.  The pneumoperitoneum was allowed to deflate under direct  vision.  The wounds were then closed in layers as follows after all trocars  removed, the umbilical wound in 2 layers with 0 Vicryl and 4-0 Monocryl,  epigastric and flank  wounds were closed with 4-0 Monocryl sutures, all wounds reinforced with  Steri-Strips and Dermabond, sterile dressings applied, anesthetic reversed,  patient removed from the operating room to the recovery room in stable  condition.  He tolerated the procedure well.      Leonie Man, M.D.  Electronically Signed     PB/MEDQ  D:  09/13/2005  T:  09/14/2005  Job:  [id number]
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
## 2011-05-12 NOTE — Consult Note (Signed)
NAMECALEEL, KINER                          ACCOUNT NO.:  [id number]   MEDICAL RECORD NO.:  [id number]                   PATIENT TYPE:  INP   LOCATION:  2022                                 FACILITY:  [facility]   PHYSICIAN:  Aram Beecham B. Eliott Nine, M.D.             DATE OF BIRTH:  05-22-1961   DATE OF CONSULTATION:  12/21/2003  DATE OF DISCHARGE:                                   CONSULTATION   REASON FOR CONSULTATION:  Hypertension in the setting of chronic renal  insufficiency.   HISTORY:  We were asked to see this 50 year old obese African-American  gentleman because of hypertension and chronic renal insufficiency.  He has  two prior [facility] admissions for severe hypertension in the  setting of cocaine use.  He has had inadequate outpatient hypertensive  control secondary to noncompliance and inconsistent medical followup, in  part due to the fact that he has no financial support and is homeless.  He  has known chronic renal failure with a baseline creatinine of 2.6 to 2.7,  and has had dipstick proteinuria for some time, although this has never been  quantitated.  He has small echogenic kidneys as far back as November 2003.   His present admission was December 18, 2003, with severe hypertension,  shortness of breath, and borderline cardiac enzymes.  Serum creatinine was  2.8.  He had a positive screen for cocaine.  His current antihypertensive  regimen consists of clonidine 0.3 mg b.i.d., Cardura 4 mg a day, Norvasc 10  mg a day.  He was deemed ready for discharge, but blood pressure today was  back up to 166/116, so we were consulted.   Social issues have been in all prohibitive in maintaining adequate health  care.  He is currently homeless, lives in a car, and gets his medications  through the [facility].   PAST MEDICAL HISTORY:  1. Long-standing hypertension, poorly controlled with admissions in November     2003, March 2004, and December 2004, for  uncontrolled hypertension.  2. Chronic renal insufficiency with baseline creatinine of 2.7.  Renal     ultrasound in November 2003 and December 2004, showing bilaterally     echogenic kidneys measuring 9.5 cm on the right, 10 cm on the left.  3. Proteinuria by dipstick, never quantitated that I can find.  4. Obesity.  5. History of chest pain with Cardiolite in March 2004, negative for     ischemia, but he did not reach his target heart rate.  Also noted were     apical and anteroapical scar and left ventricular ejection fraction of     42%.  6. History of cigarettes.  7. Cocaine use.  8. Social issues.   MEDICATIONS:  He was on no medications at all for the past month.  At the  time of his last hospital discharge, he was sent out on Verapamil 120 mg  a  day, clonidine 0.3 mg twice a day, Lasix 80 mg a day, Cardura 4 mg a day,  and aspirin 81 mg a day.  His current medications are Norvasc 10 mg a day,  clonidine 0.3 mg twice a day, Cardura 4 mg a day, and p.r.n. Tylenol.   FAMILY HISTORY:  Strongly positive for hypertension and COPD, negative for  renal disease/ESRD.   SOCIAL HISTORY:  He is homeless, lives in a car, and uses crack.  Family is  in [place].   REVIEW OF SYSTEMS:  Positive for chronic swelling of both lower extremities,  intermittent shortness of breath.  He has no dysuria or change in urinary  stream.  No nausea, vomiting, abdominal pain, headache, or diarrhea.   PHYSICAL EXAMINATION:  GENERAL:  He is somewhat sullen and not anxious to  converse.  VITAL SIGNS:  Blood pressure 130/100, heart rate 60, he is afebrile.  Room  air O2 saturation 98%.  Intake and output records are incomplete.  NECK:  He has no JVD, no carotid bruits.  HEART:  Heart sounds are distant, heard best in the subxiphoid position.  S1  and S2 grossly normal.  I cannot appreciate murmurs, rubs, or gallops.  ABDOMEN:  He is obese.  Abdomen is protuberant.  Bowel sounds are present  and  normally active.  I cannot hear bruits.  There is no palpable  organomegaly, but exam is difficult due to his large size.  EXTREMITIES:  He has 1+ edema of both lower extremities.   LABORATORY DATA:  Laboratory from December 20, 2003, sodium 138, potassium  3.6, chloride 108, CO2 24, BUN 27, creatinine 2.6, glucose 95, hemoglobin  14, hematocrit 40.  From December 18, 2003, total protein 6.5, albumin of  3.3.  Liver functions were within normal limits.  Urine screen was positive  for cocaine.  Urinalysis:  Specific gravity 1.014, pH of 5, negative for  glucose, trace for hemoglobin, greater than 300 of protein, 0 to 2 white  cells, 0 to 2 red cells, rare bacteria, and some hylan casts.   IMPRESSION:  1. Severe hypertension with recurrent hypertensive urgencies/emergencies in     the setting of cocaine and ongoing noncompliance.  His diastolic blood     pressure is currently around 100 on his current regimen, and a reasonable     goal for discharge would be 90 to 100 with an outpatient goal obviously     much more rigorous.  His continued use of cocaine and failure to take     medications make long-term management more difficult.  Would recommend     continuing Norvasc, clonidine, and Cardura at the current doses, and     adding Lasix  80 mg per day.  He has an appointment with [facility] (Dr. Margarette Canada) some  time in January.  We need to make sure that appointment is in fact  scheduled, and further medication adjustments should be done there since he  will have to obtain medications that are available on their formulary.  1. Chronic renal failure/proteinuria.  I suspect this is all secondary to     hypertension, but he could have focal sclerosis or some other underlying     glomerulopathy.  His urinalysis is bland.  His creatinine has been stable     over at least a one to two year period.  He will require an outpatient    evaluation, and this will need to be initiated through [facility] where  24 hour urine studies can be obtained prior to his seeing [place] in the     office.  His ultrasound has already been done, and I can order serologies     to be done prior to his discharge here.  [facility] can then set up an     appointment for him to see [place].  2. Social and financial issues.  This is going to be a big limiting factor.     Followup with Dr. Margarette Canada will be very important and he does agree to     keep those appointments.   Thanks for asking [place] to see this unfortunate gentleman.  We will follow  closely with you.                                               Duke Salvia Eliott Nine, M.D.    CBD/MEDQ  D:  12/21/2003  T:  12/21/2003  Job:  [id number]   cc:   Dr. Fannie Knee Drinkard  [facility]

## 2011-05-12 NOTE — Discharge Summary (Signed)
NAMEJAISHAUN, MCNAB                ACCOUNT NO.:  [id number]   MEDICAL RECORD NO.:  [id number]          PATIENT TYPE:  INP   LOCATION:  1416                         [facility]   PHYSICIAN:  Theone Stanley, MD   DATE OF BIRTH:  [birth year]   DATE OF ADMISSION:  11/06/2006  DATE OF DISCHARGE:                               DISCHARGE SUMMARY   ADMITTING DIAGNOSES:  1. Hypertensive urgency.  2. Congestive heart failure exacerbation secondary to #1.  3. Chronic kidney disease.  4. Polysubstance abuse.  5. Obesity.  6. Noncompliance.   DISCHARGE DIAGNOSES:  1. Hypertensive urgency.  2. Congestive heart failure secondary to #1.  3. Chronic kidney disease, which was stable during his stay.  His      average creatinine at 6.3.  4. Polysubstance abuse, refusing treatment.  5. Obesity.  6. Noncompliance.   CONSULTATIONS:  None.   PROCEDURES AND DIAGNOSTIC TESTS:  None.   HOSPITAL COURSE:  Mr. Mathe is a 50 year old African-American gentleman  with known hypertension and chronic kidney disease presenting with  complaints of shortness of breath.  He goes to [facility] [facility].  He ran out  of medications a month ago; however, he did not followup, and he  recently refilled his Lasix because of his shortness of breath.  He  continues to use his crack cocaine.  He had an appointment set up with  Dr. Lowell Guitar, his nephrologist; however, he did not go, apparently  secondary to a mix up.  He was admitted to the ICU, placed on a  nitroglycerin drip.  His pressures were gradually reduced, and they were  under control.  He was switched over to p.o. medications, clonidine,  verapamil, hydralazine and Lasix.  His pressures were controlled with  this combination of medications.  There was no indication for dialysis  on this admission.  His potassium and creatinine remained stable.  Because of this, it was felt that he could be discharged, since his  pressures were controlled on medication.  He does  have followup with Dr.  Lowell Guitar on November 20, 2006.  Social services was consulted in regards  to treatment options for his cocaine abuse, but he refuses.   DISCHARGE MEDICATIONS:  1. Lasix 240 mg one p.o. b.i.d.  2. Hydralazine 100 mg one p.o. b.i.d.  3. Verapamil SR 360 mg one p.o. daily.  4. Clonidine 0.2 mg one p.o. b.i.d.   FOLLOWUP:  1. Followup with Dr. Lowell Guitar on November 20, 2006.  2. Followup with [facility] in 1-2 weeks.      Theone Stanley, MD  Electronically Signed     AEJ/MEDQ  D:  11/09/2006  T:  11/09/2006  Job:  [id number]   cc:   Dala Dock

## 2011-05-12 NOTE — Op Note (Signed)
[name] [name], [name] [name]                ACCOUNT NO.:  [id number]   MEDICAL RECORD NO.:  [id number]          PATIENT TYPE:  AMB   LOCATION:  DFTL                         FACILITY:  [facility]   PHYSICIAN:  Larina Earthly, M.D.    DATE OF BIRTH:  [birth year]   DATE OF PROCEDURE:  09/11/2005  DATE OF DISCHARGE:  09/11/2005                                 OPERATIVE REPORT   PREOPERATIVE DIAGNOSIS:  Chronic renal insufficiency.   POSTOP DIAGNOSIS:  Chronic renal insufficiency.   PROCEDURE:  Creation of left wrist amino AV fistula.   SURGEON:  Larina Earthly, M.D.   ASSISTANT:  Pecola Leisure, PA.   ANESTHESIA:  MAC.   COMPLICATIONS:  None.   DISPOSITION:  Recovery room stable.   PROCEDURE IN DETAIL:  The patient was taken to the operating room and placed  in supine position where the area of the left arm was prepped and draped  usual sterile fashion. Incision was made between the level of the radial  pulse and the cephalic vein. The cephalic vein itself was of good caliber.  Tributary branches were ligated with 3-0 and 4-0 silk ties and divided. The  vein was mobilized proximally and distally and was ligated distally and  brought into approximation with the radial artery. The radial artery was  occluded proximally and distally, was opened with an 11 blade extended  longitudinally with Potts scissors. The vein was spatulated and sewn end-to-  side to the artery with a running 6-0 Prolene suture. Clamps removed and  excellent thrill was noted. There was irrigated with saline. Hemostasis  electrocautery.  Wound was closed 3-0 Vicryl in subcutaneous and  subcuticular tissue.  Benzoin and Steri-Strips applied.      Larina Earthly, M.D.  Electronically Signed     TFE/MEDQ  D:  09/11/2005  T:  09/12/2005  Job:  [id number]

## 2011-05-12 NOTE — Consult Note (Signed)
NAMEJERRAL, [name] [name]                ACCOUNT NO.:  [id number]   MEDICAL RECORD NO.:  [id number]          PATIENT TYPE:  INP   LOCATION:  3732                         FACILITY:  [facility]   PHYSICIAN:  James L. Deterding, M.D.DATE OF BIRTH:  January 15, 1961   DATE OF CONSULTATION:  02/16/2006  DATE OF DISCHARGE:                                   CONSULTATION   REFERRING PHYSICIAN:  [facility] Teaching Service   REASON FOR CONSULTATION:  Evaluation of severe chronic kidney disease.   HISTORY OF PRESENT ILLNESS:  This is a 50 year old gentleman known to [place] and  seen in the office last a few months ago by Dr. Lowell Guitar. He has a long  history of hypertension with nephrosclerosis, gradual progression of renal  disease. He had a fistula placed in his arm, he says October, but the  records indicate much prior to that. He has stage IV CKD, history of  polysubstance abuse, history of very poor social situation, he is homeless.  He says he lives where ever. He last used cocaine less than two days ago. He  has had none of his antihypertensives for over a week. He is admitted now  with chest pain for several days duration. Increased blood pressure for two  to three days, nausea, vomiting, diarrhea, and increased cough. No itching  or muscle cramping. He just feels bad. Energy is relatively poor.  Denies  dyspnea on exertion, denies PND, sleeps on one pillow, nocturia times four  to five.   REVIEW OF SYSTEMS:  HEENT: Denies headaches, visual difficulties, sore  throat, dry eyes, or dry mouth. CARDIOVASCULAR: As listed above. PULMONARY:  He has had cough with whitish phlegm. Denies wheezing. GI: As listed above.  No bloody or black stools. No bloody vomitus. SKIN: No itching or skin rash.  MUSCULOSKELETAL:  No muscle cramping. He denies aches or pains in his joints  to me, but apparently he complained of that earlier.   His medications at home include Lasix 160 mg b.i.d., clonidine 0.3 mg  b.i.d.,  Calan-SR 360 mg daily, metoprolol 50 mg b.i.d. which he has not been  taking.   ALLERGIES:  No known drug allergies.   PAST MEDICAL HISTORY:  As listed above. He has had a cholecystectomy in the  past and has a fistula also. Longstanding hypertension, admitted in 2003,  2004 twice. Uncontrolled hypertension, creatinines in the 2's to 4's. Small  dense kidneys on ultrasound.   Other history include chest pain with negative Cardiolite in 2004; history  of obesity; history of cigarette and cocaine abuse; multiple social issues.   REVIEW OF SYSTEMS:  As above.   FAMILY HISTORY:  Positive for hypertension, COPD. Negative for renal disease  or renal failure.   SOCIAL HISTORY:  As above.   OBJECTIVE:  GENERAL: He is a very difficult historian, not wanting to answer  a lot of questions and is angry.  VITAL SIGNS: Blood pressure 178/117, temperature 98.5, heart rate 86.  HEENT: Arterial narrowing, silver wiring.  NECK: No masses. No thyromegaly.  LUNGS: Decreased breath sounds and crackles in both bases.  No resonance to  percussion. Normal expansion.  CARDIOVASCULAR: Regular rhythm, grade 2/6 holosystolic murmur best heard at  the apex. PMI is 12 cm lateral to the midsternal at fifth intercostal space.  Left ventricular left, 2+ edema. Pulses 2+/4+.  ABDOMEN: Active bowel sounds, obese. Liver is down 2 cm. No splenomegaly.  Nontender.  SKIN: Somewhat dry.  LYMPHATICS:  No significant adenopathy __________ or supraclavicular or  posterior cervical adenopathy.  MUSCULOSKELETAL: No significant abnormalities.   LABORATORY DATA:  Hemoglobin 13.3, white count 13,100, platelet count  216,000. Sodium 136, potassium 4.8, chloride 108, creatinine 5.6, BUN 42,  glucose 88, bilirubin 1.5, BNP 660. Troponin less than 0.05.   He has an AV fistula in his left forearm that is somewhat small. He has a  __________ fistula.   ASSESSMENT:  1.  Chronic kidney disease, glomerular filtration rate at 15  to 20 mL per      minute. Volume in excess and hypertension, suspect his creatinine will      rise. Blood pressure control. He is not uremic at this time and no need      for dialysis. AV fistula is partial and needs evaluation prior to      dialysis. Before accepting him for end-stage renal disease he will need      significant social change prior. He needs to enter substance abuse      counseling, consider rehab as well as a residence in intensive social      support.  2.  Hypertension. Restart his medications. He will diuresis as well as his      blood pressure medication. Will gradually taper off clonidine at this      time. Use __________.  3.  Substance abuse. Needs rehab prior to end-stage renal disease.  4.  Social  situation. He needs residence, etc.  5.  Rule out psych and hyperparathyroidism.   PLAN:  1.  CVTS to see.  2.  Follow his blood pressure, monitor his medications, withdraw  his      clonidine and substitute vasodilators.  3.  Needs rehab, consider psychiatric evaluation.  4.  Social service input.           ______________________________  Llana Aliment. Deterding, M.D.     JLD/MEDQ  D:  02/16/2006  T:  02/18/2006  Job:  [id number]

## 2011-05-12 NOTE — H&P (Signed)
NAMERENARD, [name] [name]                ACCOUNT NO.:  [id number]   MEDICAL RECORD NO.:  [id number]          PATIENT TYPE:  INP   LOCATION:  5501                         FACILITY:  [facility]   PHYSICIAN:  Wilmon Arms. Corliss Skains, M.D. DATE OF BIRTH:  02-14-1961   DATE OF ADMISSION:  09/12/2005  DATE OF DISCHARGE:                                HISTORY & PHYSICAL   HISTORY:  The patient is a 50 year old African-American male with chronic  renal insufficiency and documented cholelithiasis.  He presented today with  severe right upper quadrant pain.  He has had previous episodes similar to  this.  The patient was seen earlier this summer and was found to have  cholelithiasis.  He has seen Dr. Luisa Hart on an outpatient basis with plans  for scheduling an elective cholecystectomy.  The patient's renal function  appears to have been worsening recently, and he has undergone a recent  creation of an AV fistula for potential dialysis.  In the emergency  department, the patient was given intravenous pain medication and this  partially relieved his symptoms.   MEDICATIONS:  1.  Clonidine.  2.  Lasix.  3.  Calan.  4.  Coreg.   ALLERGIES:  No known drug allergies.   PAST MEDICAL HISTORY:  1.  Hypertension.  2.  Chronic renal insufficiency.  3.  History of cocaine abuse.  4.  Morbid obesity.   PAST SURGICAL HISTORY:  Some type of neck surgery.   LABORATORY DATA:  White count 13.3, hemoglobin 14.  Electrolytes:  Sodium  140, potassium 3.7, chloride 109, bicarbonate 24, BUN 29, creatinine 4.3,  glucose 103.  Total bilirubin is 1.6, lipase is 23.   PHYSICAL EXAMINATION:  VITAL SIGNS:  Temperature 98, blood pressure  initially 193/109, recheck was 169/90, heart rate 75, respirations 22.  GENERAL:  This is an obese African-American male laying relatively  comfortably on his side in no apparent distress.  HEENT:  EOMI.  Sclerae anicteric.  NECK:  No masses.  LUNGS:  Clear.  HEART:  Regular rate and  rhythm.  ABDOMEN:  Obese, soft, moderately tender in the right upper quadrant, no  rebound, no guarding.  EXTREMITIES:  The patient has a fresh surgical dressing on his left forearm.  There is a palpable thrill in the forearm.   IMPRESSION:  1.  Symptomatic cholelithiasis.  2.  Chronic renal insufficiency.   PLAN:  1.  Admit the patient for intravenous pain control.  2.  Renal consult to optimize renal function prior to possible elective      cholecystectomy this admission.      Wilmon Arms. Tsuei, M.D.  Electronically Signed     MKT/MEDQ  D:  09/13/2005  T:  09/13/2005  Job:  [id number]

## 2011-05-12 NOTE — H&P (Signed)
[name], [name]                ACCOUNT NO.:  [id number]   MEDICAL RECORD NO.:  [id number]          PATIENT TYPE:  INP   LOCATION:  3732                         FACILITY:  [facility]   PHYSICIAN:  Pearlean Brownie, M.D.DATE OF BIRTH:  [birth year]   DATE OF ADMISSION:  02/16/2006  DATE OF DISCHARGE:                                HISTORY & PHYSICAL   CHIEF COMPLAINT:  Shortness of breath.   HISTORY OF PRESENT ILLNESS:  [name] [name] is a 50 year old gentleman, with a  known history of chronic renal failure, hypertension, cocaine abuse and  obesity, who presents with a several day history of worsening shortness of  breath. He also complains of left-sided chest pain which he is unable to  fully describe except as worse with cough. He cannot describe any radiation  or pressure-type pain. He states that he has not had his medications for at  least one week. He additionally has had a very positive review of systems  including nausea, vomiting, diarrhea and worsening edema. He has also had a  cough which is nonproductive in nature.   He is a bit irritated with the [facility] medical personnel so he is not very  forthcoming with our interview today.   REVIEW OF SYSTEMS:  Positive for cough. Positive for nausea and vomiting x1  week. Positive for watery diarrhea x1 day. Denies fever. Does complain of  chest pain and shortness of breath. Denies hematochezia or melena. Complains  of headache and visual changes but these are baseline for him and not worse  than usual. He has had no changes in his mental status. No dysphagia and no  focal weakness. He does complain of lower extremity edema but no slurred  speech.   PAST MEDICAL HISTORY:  1.  Chronic renal failure with AV fistula placement in September of 2006.      Not yet started on hemodialysis.  2.  Hypertension.  3.  Cocaine abuse.  4.  Obesity.  5.  He had a Cardiolite in March of 2004 which was negative for ischemia. It      showed suspected  apical and anteroapical scar and ejection fraction of      42%.   PAST SURGICAL HISTORY:  1.  Cholecystectomy in September of 2006.  2.  Neck surgery.  3.  A left wrist AV fistula in September of 2006.   MEDICATIONS:  List is per [facility].  1.  Lasix 80 milligrams 2 tablets p.o. b.i.d.  2.  Clonidine 0.3 milligrams tablets b.i.d.  3.  Calan XR 240 milligrams 1-1/2 tablets daily.  4.  Metoprolol 50 milligrams p.o. b.i.d.  5.  Coreg 25 milligrams daily. He is currently not taking that medication.   ALLERGIES:  He has no known drug allergies.   SOCIAL HISTORY:  He is homeless. He denies alcohol but does admit to  occasional cocaine use (last was two days prior to admission) and does admit  to tobacco abuse approximately one pack per day.   FAMILY HISTORY:  His mother is living. He is not sure of her age. He says  she  has health problems but he does not know exactly what they are. His  father is living. He does not know his age and does not know his medical  problems. There is a positive family history for hypertension but no known  coronary artery disease or renal disease.   PHYSICAL EXAMINATION:  VITAL SIGNS: Temperature 98.2, pulse 84 to 103,  respirations 14 to 22, blood pressure 199/130 up to 220/123. Saturation 97%  on four liters nasal cannula. Of note, his blood pressure dropped to 155/107  after two 40 milligram doses of labetalol IV and 120 milligrams of Lasix.  GENERAL: He is in mild distress but able to speak in full sentences. He is  alert and oriented x4.  HEENT: Pupils are equal, round, and reactive to light. Extraocular movements  intact. His oropharynx is without erythema or exudate. He has no  thyromegaly, no lymphadenopathy, and no carotid bruits could be appreciated.  CARDIOVASCULAR: Regular rate and rhythm. Distant heart sounds and a  suspected 1/6 murmur.  LUNGS: No wheezes are noted. He has scattered crackles bilaterally with fair  air movement.  ABDOMEN:  Soft, nontender, and nondistended with normal active bowel sounds.  EXTREMITIES: He has 2+ distal pulses, 2 to 3+ pitting edema. His left wrist  AV fistula is noted to have both a thrill and bruit which is audible.  NEUROLOGICAL: Cranial nerves II through XII are grossly intact. Strength of  5/5 in bilateral upper and lower extremities and he has 1+ DTR's  bilaterally.   LABORATORY DATA:  Chest x-ray was consistent with mild CHF. A BNP was 660.  White count 15.5, H&H 13.7 nd 41.1, platelets 204,000, neutrophils 82%, MCV  84.3%. Point of care cardiac enzymes, CK-MB 40, troponin I less than 0.05.  Myoglobin greater than 500. Complete metabolic panel: Sodium 136, potassium  4.8, chloride 108, bicarbonate 23, BUN 42, creatinine 5.6. Glucose 88, total  bilirubin 1.5, direct bilirubin 0.2, indirect bilirubin 1.3, alkaline  phosphate 66. AST 15, ALT 12, total protein 7.4, albumin 3.6. Urinalysis  yellow, clear, specific gravity 1.007, pH 7.5, negative for glucose,  negative for bilirubin, negative for ketones, trace blood, 100 protein, 0.2  urobilinogen, negative nitrates, negative for leukocyte esterase, rare  epithelial cells, 0 to 2 white blood cells, 0 to 2 red blood cells, rare  bacteria. Urine drug screen was positive for cocaine and was otherwise  negative.   EKG shows sinus tachycardia with no ST changes since September of 2006.   ASSESSMENT/PLAN:  1.  A 50 year old man with shortness of breath. This looks like fluid      overload from congestive heart failure on his chest x-ray. This is      likely secondary to his severely elevated blood pressures from      noncompliance over the week or two prior to admission. He does have an      elevated white blood count but no fever and no evidence of pneumonia on      chest x-ray so I do not think the antibiotics are necessary at this      point. It looks like it has been about three years since his last     cardiac workup, so we will check  cardiac enzymes x3 sets and a 2-D      echocardiogram. We will give Lasix 80 milligrams IV t.i.d. The normal      dose is 160 milligrams p.o. b.i.d.  2.  Chest pain: We will rule him out with  cardiac enzymes and recheck and      EKG in the morning. We will also check a fasting lipid profile in the      morning.  3.  Renal failure: Serum creatinine appears to be elevated from what it was      in the fall. His arteriovenous fistula looks like it could potentially      be ready to be used. We will talk to the nephrology team about the need      to start hemodialysis in the near future.  4.  Nausea and vomiting: This seems to be improving. We will treat this on      an as needed basis.  5.  Hypertensive urgency: Most likely secondary to noncompliance with this      medication. We will restart his home      medication. Initially, we thought we may restart some metoprolol despite      his cocaine use because it appears that he takes both of these. However,      it was subsequently decided the metoprolol would be held. The patient      desires to be a full code.      Ursula Beath, MD    ______________________________  Pearlean Brownie, M.D.    JT/MEDQ  D:  02/17/2006  T:  02/17/2006  Job:  [id number]

## 2011-05-12 NOTE — Discharge Summary (Signed)
[name] [name], [name] [name]                            ACCOUNT NO.:  [id number]   MEDICAL RECORD NO.:  [id number]                   PATIENT TYPE:  INP   LOCATION:  4733                                 FACILITY:  [facility]   PHYSICIAN:  Lorne Skeens, D.O.                   DATE OF BIRTH:  [birth year]   DATE OF ADMISSION:  03/11/2003  DATE OF DISCHARGE:  03/14/2003                                 DISCHARGE SUMMARY   PRIMARY CARE PHYSICIAN:  Willis Modena, M.D.   CONSULTS:  Cardiology, Cassell Clement, M.D.   DISCHARGE DIAGNOSES:  1. Chest pain, rule out myocardial infarction.  2. Hypertension.  3. Chronic renal insufficiency.  4. Cocaine abuse.  5. Tobacco abuse.  6. Obesity.  7. Glucose intolerance.   DISCHARGE MEDICATIONS:  1. Verapamil 120 mg p.o. daily.  2. Clonidine 0.3 mg one p.o. b.i.d.  3. Lasix 80 mg p.o. daily.  4. Cardura 4 mg one p.o. daily.  5. Aspirin 81 mg daily.   SPECIAL INSTRUCTIONS:  The patient is to stop his Norvasc.  He may take  Tylenol as needed for pain.  He is advised on exercise and weight loss and  is to adhere to a low-calorie, low-fat diet.  Instructed to take all  medications as directed.   FOLLOW-UP:  A follow-up appointment has been made at [facility] on [facility] on March 24, 2003, at 11 a.m.   HISTORY OF PRESENT ILLNESS:  The patient is a 50 year old African American  male with a past medical history significant for hypertension and  proteinuria with an elevated serum creatinine.  He presented to the [facility] ED with chest pain.  He does state a history of chest  pain with exertion that sounds like stable angina.  He takes no medications  for this chest pain.  However, on the day of admission, the chest pain was  worse than his normal chest pain.  On the day of admission, the chest pain  was more intense and lasted longer.  He also has a history of cocaine abuse  and has been homeless for about the last two weeks.  His  cardiac risk  factors include tobacco abuse, obesity, hypertension, and a history of  cocaine use.   PHYSICAL EXAMINATION:  His blood pressure was elevated at 207/124.  After  being given IV nitroglycerin, it decreased to 161/88.  His pulse on  admission was 74 and he was saturating 96% on room air.  His physical exam  was insignificant, except for 1-2+ nonpitting pedal edema up to the knees.   LABORATORY DATA:  The chest x-ray on admission revealed cardiomegaly with  some mild pulmonary vascular congestion.  His EKG on admission showed sinus  rhythm with no change compared to EKG on October 28, 2003.  Cardiac markers  were obtained showing results of troponin I's at  0.03, 0.02, and 0.02.  His  CK was elevated at 343, 235, and 205 and his MB was elevated at 9.7, 8.0,  and 6.5.  A fasting lipid panel was obtained showing a total cholesterol of  156, triglycerides 77, HDL 36, and an LDL of 105.  His chemistry panel on  admission showed a sodium of 141, potassium 3.7, chloride 107, bicarbonate  26, BUN 32, and creatinine 2.8, which on recheck was 2.5.  His glucose was  94.  His liver function tests were normal.  His CBC on admission showed a  white count of 11.9, hemoglobin 14.1, hematocrit 41.4, and platelet count  214.  His urine drug screen on admission was positive for cocaine.   HOSPITAL COURSE:  #1 - CHEST PAIN:  The patient was ruled out for myocardial  infarction as stated by the above cardiac markers and EKG results, as well  as chest x-ray.  Due to his cardiac risk factors, including smoking, cocaine  use, and hypertension with a relative risk of obesity, cardiology was  consulted for further risk stratification with an adenosine Cardiolite  stress test which was performed on the day of discharge.  The final results  are pending.  The patient's chest pain resolved once he was seen in the  emergency department and he had no further chest pain.  During this stress  test, he did  complain of some chest tightness which resolved after the test  was done.  During his stay, he denied any diaphoresis, dyspnea, or radiation  of chest pain.  The patient likely does have stable angina with decreased  exercise tolerance.   #2 - HYPERTENSION:  On admission, the patient was on Cardura, clonidine, and  Norvasc for hypertension.  The patient states that he was compliant with his  blood pressure medicine, however, had a very high blood pressure of 207/124  on admission.  He was placed on IV nitroglycerin in the ER with decrease in  blood pressure.  He was then left on his home medications and switched from  Norvasc to verapamil due to the side effects of dihydropyridines on the  renal arterials.  His blood pressure remained under good control ranging  from 125-169/65-103 by discharge day.  The patient was instructed to not  skip any doses of his clonidine as he may be having rebound hypertension.  He was also instructed on how cocaine use will increase his blood pressure  uncontrollably.   #3 - CHRONIC RENAL INSUFFICIENCY:  From a prior hospitalization, his  creatinine level was 2.7.  That was in November of 2003.  On admission, the  creatinine was around the same range at 2.8 and on recheck was 2.5.  Prior  studies showed a renal ultrasound performed in November of 2003 showing  chronic renal disease.  The patient has not been worked up by nephrology in  the past and is encouraged to follow up as an outpatient.  Renal  insufficiency is likely secondary to poorly controlled high blood pressure  and longstanding cocaine use.   #4 - COCAINE ABUSE:  The patient did receive a social work consult and has  denied any rehabilitation services at this time.  He has been counseled on  the dangers of cocaine abuse and understands the consequences of his actions  very likely contributing to his uncontrollable hypertension.  #5 - SOCIAL:  The patient was seen by social work as stated  above for his  living conditions and he has been  given the phone numbers of several  homeless shelters.   #6 - TOBACCO ABUSE:  The patient was counseled on the risks of tobacco abuse  and currently is not interested in cessation.   #7 - OBESITY AND GLUCOSE INTOLERANCE:  A fasting blood sugar was checked on  March 12, 2003, showing a level of 110.  The patient has no preexisting  diagnosis of diabetes and denies any family history of diabetes.  The  patient was counseled on the importance of diet and exercise, but should be  followed up as an outpatient as diabetes could contribute to his worsening  renal failure and he may be a candidate for oral agents in the future.   FOLLOW-UP ITEMS:  1. The patient should have an outpatient renal follow-up for his elevated     serum creatinine.  2. Follow up on the results of the adenosine Cardiolite stress performed on     March 12, 2003, and follow up with Cassell Clement, M.D., as needed.  3. Follow up on the patient's compliance with antihypertensive medications.  4. Follow up on the patient's abstinence from cocaine.  5. Check an additional fasting blood sugar in the future due to his mild     elevation at 110.  The patient may be a candidate for oral hypoglycemic     agents and should be involved in a diet and exercise program.                                               Lorne Skeens, D.O.    KL/MEDQ  D:  03/13/2003  T:  03/15/2003  Job:  [id number]   cc:   Willis Modena, M.D.  [facility]  Hetty Ely, M.D.  [address]  [place]  [place] [address]  Fax: [phone]

## 2011-05-12 NOTE — Discharge Summary (Signed)
NAMEARROW, TOMKO                ACCOUNT NO.:  [id number]   MEDICAL RECORD NO.:  [id number]          PATIENT TYPE:  INP   LOCATION:  3732                         FACILITY:  [facility]   PHYSICIAN:  Leighton Roach McDiarmid, M.D.DATE OF BIRTH:  1961-05-21   DATE OF ADMISSION:  02/16/2006  DATE OF DISCHARGE:  02/20/2006                                 DISCHARGE SUMMARY   DISCHARGING DIAGNOSES:  1.  Congestive heart failure with an ejection fraction of 45% to 55% as seen      by 2-D echocardiogram performed on  February 19, 2006.  2.  Chronic renal insufficiency.  3.  Polysubstance abuse.  4.  Homeless.  5.  Obesity.  6.  Arteriovenous fistula placement in September 2006, but not yet started      on hemodialysis.  7.  Hypertension.  8.  Cardiolite in March of 2004, which was negative for ischemia, but it      showed suspected apical and anterior apical scarring.   DISCHARGING MEDICATIONS:  1.  Simvastatin 20 mg one tablet daily at bedtime.  2.  Isosorbide mononitrate 60 mg one tablet daily in a.m.  3.  Hydralazine 100 mg one tablet twice daily.  4.  Calan SR (also known as verapamil) 180 mg -- take two tablets at      bedtime.  5.  Furosemide 80 mg -- take one tablet twice daily.  6.  Clonidine 0.1 mg -- take one tablet twice daily.   PRIMARY MEDICAL DOCTOR:  [facility] on [address] [phone].   PRIMARY CARE Erika Hussar:  His primary renal doctor is Dr. Lowell Guitar.  Currently,  the patient is not on hemodialysis.   PROCEDURES WHILE INPATIENT:  1.  Two-dimensional echocardiogram performed on February 19, 2006 which      showed left ventricular ejection fraction was estimated to be 45% to      55%.  There were no left ventricular regional wall motion abnormalities.      Left ventricular wall thickness was moderately increased.  Left atrium      was markedly dilated.  Right ventricular size was upper limits of      normal.  There is no significant aortic valvular regurgitation.  2.  Chest  x-ray on February 16, 2006 showed findings compatible with mild      incipient congestive heart failure with cardiomegaly and pulmonary      vascular congestion.   BRIEF HOSPITAL COURSE:  The patient is a 50 year old African American male  with past medical history significant for chronic renal insufficiency,  currently with an A-V fistula being placed in September 2006, however, is  currently not on hemodialysis with an unknown baseline creatinine.  However,  on admission, had a creatinine of 5.6.  He was noted to also have a past  medical history significant for cocaine abuse, hypertension and obesity.  Please see the dictated H&P for further details of his admission.   PROBLEM #1 - CONGESTIVE HEART FAILURE:  The patient was admitted with a  several-day history of shortness of breath.  He had been worsening with  cough.  He had some intermittent chest pain associated with this and  basically had not been taking his medicines recently.  His BNP on admission  was 660.  The patient was given 120 mg of IV Lasix while in the emergency  room and began to diurese.  His chest x-ray showed congestive pulmonary  edema, consistent with CHF, and the patient was admitted for further  diuresis.  The patient was continued on Lasix 80 mg IV 3 times daily and  then on discharge date, was dropped down to 80 mg twice daily p.o.  Renal  was consulted for his known creatinine and chronic renal insufficiency while  he was an inpatient.  The patient also complained of chest pain and so he  was ruled out for a myocardial infarction.  He had some elevated troponins  which were consistent with congestive heart failure.  He did not describe  any more chest pain once he was admitted.  EKG were obtained with each  cardiac enzyme.  Cardiac panel showed troponin I of 0.14 and CK of 112.  On  the next set, troponin was 0.13.  On the next set, creatine kinase was 105  and then the next set, troponin I was 0.11 with a  creatine kinase of 93.  EKGs showed normal sinus rhythm, some chronic ST elevations in V4, V5,  repeat T's in V4, V5 and V6, but this was consistent with his previous EKGs.  There were no ST changes or Q waves as noted on EKG.  The patient was ruled  out for an MI and it was thought that his elevations were secondary to  congestive heart failure.   PROBLEM #2 - HYPERTENSION:  The patient was noted to be hypertensive on  admission with a blood pressure of 199/130 and 220s over 123.  The patient  was thought to be in CHF exacerbation secondary to recent cocaine use as  well as poor hypertension uncontrolled.  The patient was treated for his  hypertension with diuresis and this improved, and was also started on  isosorbide mononitrate as well as hydralazine.  The patient was continued on  his Calan SR and came in on clonidine 0.3 mg 3 times daily.  The patient was  weaned off his clonidine down to 0.1 mg twice daily.  The patient was also  on a home dose of metoprolol with even his known usage of crack cocaine.  He  was discontinued off his metoprolol and was told that this was not a good  idea to continue this medicine while using crack cocaine.  The patient was  encouraged to quit using crack cocaine.  Clonidine was attempted to be  tapered; this can be followed by [facility] for further titration of his  blood pressure medicines by increasing the other ones and getting him of the  clonidine, since this is not a good medicine secondary to his history of  crack cocaine use.   PROBLEM #3 - RENAL:  The patient was a known end-stage renal disease,  currently not a candidate for hemodialysis secondary to his crack cocaine  usage.  He does currently have an A-V fistula in place in the left forearm.  Dopplers were obtained on February 20, 2006 to evaluate for patency of the A-  V fistula; the Dopplers were pending at time of discharge summary.  The patient will be followed up with Dr. Lowell Guitar of  Renal as previously  scheduled.  The patient was instructed heavily on quitting the  use of crack  cocaine secondary he would probably need hemodialysis within 3-5 months;  however, he is currently not a candidate while he is a polysubstance abuser.  The patient's creatinine was 5.6 on admission and on day of discharge, was  5.4.  His albumin was 3.0, his calcium was 8.2 and his phosphorus was 5.1.  His BUN was 53 on date of discharge.  Parathyroid hormone levels were  obtained per [facility], which were noted to be 844, 0.4 and calcium was 8.4.  His CBC on date of discharge was white blood cell count of 11, hemoglobin  12.6, hematocrit of 37.4 and platelets of 197,000.  The patient will be  followed up as an outpatient.   PROBLEM #4 - POLYSUBSTANCE ABUSE:  The patient was heavily talked to about  his quitting usage of crack cocaine.  The patient denied adamantly that the  only time he uses it is during the winter months when he currently is  homeless and has no place to stay and during the winter months, he is only  able to stay at crack houses.  The patient denied any further assistance  with housing at this time.  Social Work was consulted to rule out options  for houses such as [employer] as well as [facility].  The patient denied  wanting to use these; he had previously used them and did not want to  further evaluate this.  Social Work was reconsulted to discuss this attempt  at assisted living facility.  The patient's placement is currently an issue  at time of discharge.  If the patient decides to not to go with the assisted  living facility, he understands that he cannot stay at the hospital and will  have to be discharged home.  The patient does have family in [place],  however, decided to not go into this option at this time to go be with his  family.  The patient was very adamant about understanding his situation.  He  did state he was somewhat depressed; however, he declined  psychological or  further psychiatric workup.  The patient did not want to be placed in  inpatient rehab nor inpatient behavioral health situations.   DISCHARGE INSTRUCTIONS:  The patient will be discharged today.  He has  reached maximum benefit of this hospitalization and is now ready for  discharge.   FOLLOWUP:  The patient will follow up with [facility] on February 21, 2006  at 3 p.m.  Per [facility], the patient must keep his followup in order to  be able to obtain his medicines.  The patient will also follow up with Dr.  Lowell Guitar of Ren[facility] as they had scheduled previously.   DISCHARGE MEDICATIONS:  The patient has written prescriptions for his new  medicines as well as his old medicines x1 and may get these filled at  [facility].   DISCHARGE PHYSICAL EXAM:  VITAL SIGNS:  Blood pressure on day of discharge  was 140s over 76, 135/84 and 128/90.  He is saturating 98% on room air.  He  was afebrile and vital signs were stable. LUNGS:  Clear to auscultation bilaterally.  CARDIOVASCULAR:  Regular rate and rhythm with no murmurs, rubs, or gallops.      Barth Kirks, M.D.    ______________________________  Leighton Roach McDiarmid, M.D.    MB/MEDQ  D:  02/20/2006  T:  02/21/2006  Job:  [id number]   cc:   Cleophas Dunker, [place], [place] [facility]

## 2011-05-12 NOTE — H&P (Signed)
NAMEJEOFFREY, [name] [name]                ACCOUNT NO.:  [id number]   MEDICAL RECORD NO.:  [id number]          PATIENT TYPE:  INP   LOCATION:  0155                         FACILITY:  [facility]   PHYSICIAN:  Corinna L. Lendell Caprice, MDDATE OF BIRTH:  [birth year]   DATE OF ADMISSION:  11/06/2006  DATE OF DISCHARGE:                                HISTORY & PHYSICAL   CHIEF COMPLAINT:  Shortness of breath.   HISTORY OF PRESENT ILLNESS:  [name] [name] is an unassigned 50 year old black  [name] with multiple medical problems including congestive heart failure,  hypertension and chronic kidney disease who presented to [facility]  after having ran out of his medications a month ago.  He wanted to refill  for his Lasix because he was short of breath.  He said it has been gradual  and it is typical of his previous exacerbations of heart failure.  He  continues to use crack cocaine.  He was supposed to have a visit with Dr.  Lowell Guitar, his nephrologist, earlier this month but got mixed up about the  dates and did not make it to the appointment.  He has had no chest pain.  He  thought he may have a cold because he has been coughing up sputum.  He does  not further qualify but he has had no fevers, chills, no sore throat,  headache, rhinorrhea, no wheeze.   PAST MEDICAL HISTORY:  1. Congestive heart failure with ejection fraction of 45-50% on last      echocardiogram.  2. Hypertension.  3. Chronic kidney disease.  4. Polysubstance abuse.  5. He has had a AV fistula placed in his left arm but is not on      hemodialysis currently.  6. Obesity.  7. Noncompliance.   MEDICATIONS:  Currently none.  The list from [facility] supposed to be on  Calan SR 360 mg a day, hydralazine 100 mg b.i.d.  , Imdur 30 mg a day,  Clonidine 0.1 mg p.o. b.i.d., metoprolol 50 mg p.o. b.i.d., Lasix 160 mg  p.o. two to three times a day, Zocor 20 mg a day.   SOCIAL HISTORY:  The patient admits to continued crack cocaine abuse.  He  is  homeless and smokes a pack of cigarettes a day.   FAMILY HISTORY:  Reviewed and as per previous dictations.  Review of systems  as above otherwise negative.   PHYSICAL EXAMINATION:  Temperature is 98.8.  Blood pressure initially  162/116.  At [facility] his blood pressure, prior to getting any  hydralazine or Lasix, was 220/140.  Heart rate 86, respiratory rate 28,  oxygen saturation 99% on room air.  GENERAL:  The patient is malodorous obese black [name] in no acute distress.  HEENT:  Normocephalic, atraumatic.  Pupils equal, round, reactive to light.  Moist mucous membranes.  Neck is supple.  He does have JVD.  No carotid  bruits.  LUNGS:  Diminished throughout without wheezes, rhonchi or rales.  CARDIOVASCULAR:  Regular rate and rhythm without murmurs, gallops or rubs.  His PMI is displaced laterally and enlarged.  ABDOMEN:  Obese, soft, nontender.  GU/RECTAL:  Deferred.  EXTREMITIES:  No clubbing, cyanosis or edema.  Pulses are intact.   LABS:  CBC is significant for hemoglobin of 11.9, hematocrit 35.4 otherwise  unremarkable.  Sodium 141, potassium 4.1, chloride 116, bicarbonate 20,  glucose 74, BUN 64, creatinine 6.4.  BNP 390.  Urine drug screen positive  for cocaine.  Blood alcohol less than 5.  EKG shows normal sinus rhythm with  poor R-wave progression.  LVH by voltage with strain which is unchanged from  previous.  Left atrial enlargement.  Chest x-ray is negative.   ASSESSMENT/PLAN:  1. Dyspnea secondary to congestive heart failure from diastolic      dysfunction and medical noncompliance as well as his repeat chronic      kidney disease and crack use:  The patient will be admitted to step-      down unit.  I will start him on a nitroglycerin drip which should get      his blood pressure down.  Resume his clonidine, verapamil, hydralazine.      I will not give him beta blocker due to his continued crack cocaine      abuse.  He will get Lasix IV and low salt  diet.  2. Hypertensive urgency:  See above.  3. Obesity.  4. Chronic renal failure:  The patient has no emergent need for dialysis.      He is still making urine.  His last creatinine in February was just      over five.  It may be worthwhile to get Dr. Lowell Guitar involved as he      missed his last appointment; however, I doubt that they would start him      on hemodialysis as he continues to abuse cocaine.  5. Crack cocaine abuse, counseled against:  Apparently, he has had social      work consult and referral to [facility] and various other agencies in the      past.  6. Noncompliance.      Corinna L. Lendell Caprice, MD  Electronically Signed     CLS/MEDQ  D:  11/06/2006  T:  11/06/2006  Job:  [id number]   cc:   [facility]

## 2011-05-12 NOTE — Discharge Summary (Signed)
[name], [name]                ACCOUNT NO.:  [id number]   MEDICAL RECORD NO.:  [id number]          PATIENT TYPE:  INP   LOCATION:  5501                         FACILITY:  [facility]   PHYSICIAN:  Leonie Man, M.D.   DATE OF BIRTH:  1961-05-28   DATE OF ADMISSION:  09/12/2005  DATE OF DISCHARGE:  09/14/2005                                 DISCHARGE SUMMARY   DISCHARGE DIAGNOSIS:  1.  Chronic calculus cholecystitis status post laparoscopic cholecystectomy      with interoperative cholangiogram on September 13, 2005, by Dr. Lurene Shadow.  2.  End stage renal disease not currently on hemodialysis but has already      had an arteriovenous fistula placed on September 11, 2005.  3.  Hypertension.  4.  History of cocaine abuse.  5.  Obesity.   HOSPITAL COURSE:  [name] is a 50 year old male patient who had recently  had right upper quadrant abdominal pain and documented cholelithiasis.  He  had been seeing Dr. Luisa Hart for this problem prior to his admission.  On  September 12, 2005, he required hospital admission secondary to recurrent,  severe right upper quadrant pain.  Because of his recurrent symptoms, we did  proceed with laparoscopic cholecystectomy.  He tolerated the procedure well  and the following day was ready for discharge to home.   DISCHARGE MEDICATIONS:  He is to continue the following medications:  Clonidine, Lasix, Calan, and Coreg.  We have given him a prescription for  Vicodin as needed for pain.   DISCHARGE INSTRUCTIONS:  He is to continue his renal diet.  No driving for  two days.  No lifting over 10 pounds for three weeks.  He may increase his  activities slowly.  He may shower.  He may walk up stairs.  He is to call  for fever greater than 101.5, call if there is any redness or drainage of  the incision.  He is to follow up to see Dr. Lurene Shadow in 2-3 weeks.      Guy Franco, P.A.      Leonie Man, M.D.  Electronically Signed    LB/MEDQ  D:  09/14/2005  T:   09/14/2005  Job:  [id number]

## 2011-06-05 ENCOUNTER — Other Ambulatory Visit (HOSPITAL_COMMUNITY): Payer: Self-pay | Admitting: Nephrology

## 2011-06-05 DIAGNOSIS — N186 End stage renal disease: Secondary | ICD-10-CM

## 2011-06-12 ENCOUNTER — Other Ambulatory Visit (HOSPITAL_COMMUNITY): Payer: Self-pay | Admitting: Nephrology

## 2011-06-12 DIAGNOSIS — N186 End stage renal disease: Secondary | ICD-10-CM

## 2011-06-13 NOTE — Op Note (Signed)
  [name], [name] [name]                ACCOUNT NO.:  [id number]  MEDICAL RECORD NO.:  [id number]           PATIENT TYPE:  O  LOCATION:  SDSC                         FACILITY:  [facility]  PHYSICIAN:  Larina Earthly, M.D.    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  PROCEDURE:  Revision with resection of large venous aneurysm and end-to- end anastomosis of the cephalic vein fistula.  SURGEON:  Larina Earthly, MD  ASSISTANT:  Della Goo, PA-C  ANESTHESIA:  MAC.  COMPLICATIONS:  None.  DISPOSITION:  To recovery room stable.  PROCEDURE DETAIL:  The patient was taken to the operating room, placed in supine position.  The area of the left arm was prepped and draped in usual sterile fashion.  Using local anesthesia, an ellipse was made over the large venous aneurysm in the mid upper arm and cephalic vein fistula.  The aneurysmal segment was identified and the vein was quite dilated above and below this due to the longstanding 4-year AV fistula. The vein was of normal consistency above and below the aneurysm.  The patient was given 5000 of intravenous heparin.  After adequate circulation time, the vein was occluded proximal and distal to the venous aneurysm.  The venous aneurysm was opened and was resected. There was adequate tortuosity to make an end-to-end anastomosis of the existing cephalic vein without tension.  This was with a running 5-0 Prolene suture.  Clamps removed and a good thrill was noted.  Wounds were irrigated with saline.  Hemostasis with electrocautery.  Wounds were closed with 3-0 Vicryl in the  subcutaneous and subcuticular tissue. Benzoin and Steri-Strips were applied.    Larina Earthly, M.D.    TFE/MEDQ  D:  05/04/2011  T:  05/04/2011  Job:  [id number]  Electronically Signed by TODD EARLY M.D. on 06/13/2011 01:09:14 PM

## 2011-06-15 ENCOUNTER — Ambulatory Visit (HOSPITAL_COMMUNITY): Payer: Medicare Other

## 2011-06-15 ENCOUNTER — Ambulatory Visit (HOSPITAL_COMMUNITY)
Admission: RE | Admit: 2011-06-15 | Discharge: 2011-06-15 | Disposition: A | Discharge status: Home / Self Care | Payer: Medicare Other | Source: Ambulatory Visit | Attending: Nephrology | Admitting: Nephrology

## 2011-06-15 ENCOUNTER — Other Ambulatory Visit (HOSPITAL_COMMUNITY): Payer: Self-pay | Admitting: Nephrology

## 2011-06-15 ENCOUNTER — Inpatient Hospital Stay (HOSPITAL_COMMUNITY): Admit: 2011-06-15 | Payer: Self-pay

## 2011-06-15 DIAGNOSIS — N186 End stage renal disease: Secondary | ICD-10-CM

## 2011-06-15 DIAGNOSIS — R58 Hemorrhage, not elsewhere classified: Secondary | ICD-10-CM

## 2011-06-15 DIAGNOSIS — T82898A Other specified complication of vascular prosthetic devices, implants and grafts, initial encounter: Secondary | ICD-10-CM | POA: Insufficient documentation

## 2011-06-15 DIAGNOSIS — Y832 Surgical operation with anastomosis, bypass or graft as the cause of abnormal reaction of the patient, or of later complication, without mention of misadventure at the time of the procedure: Secondary | ICD-10-CM | POA: Insufficient documentation

## 2011-06-15 MED ORDER — IOHEXOL 300 MG/ML  SOLN
100.0000 mL | Freq: Once | INTRAMUSCULAR | Status: AC | PRN
  Administered 2011-06-15: 48 mL via INTRA_ARTERIAL

## 2011-09-18 LAB — CBC
HCT: 36.2 — ABNORMAL LOW
HCT: 37.3 — ABNORMAL LOW
Hemoglobin: 12 — ABNORMAL LOW
Hemoglobin: 12.6 — ABNORMAL LOW
MCHC: 33.6
MCV: 87
MCV: 87.3
Platelets: 169
RBC: 3.98 — ABNORMAL LOW
RBC: 4.17 — ABNORMAL LOW
RDW: 15.8 — ABNORMAL HIGH
WBC: 8.5

## 2011-09-18 LAB — BASIC METABOLIC PANEL
BUN: 64 — ABNORMAL HIGH
Chloride: 101
Glucose, Bld: 86
Potassium: 4.7

## 2011-09-18 LAB — CARDIAC PANEL(CRET KIN+CKTOT+MB+TROPI): Troponin I: 0.07 — ABNORMAL HIGH

## 2011-09-18 LAB — DIFFERENTIAL
Eosinophils Absolute: 0.1
Eosinophils Relative: 1
Lymphs Abs: 1.4
Monocytes Absolute: 0.7

## 2011-09-18 LAB — RENAL FUNCTION PANEL
BUN: 47 — ABNORMAL HIGH
BUN: 82 — ABNORMAL HIGH
CO2: 23
Calcium: 8.7
Chloride: 104
Creatinine, Ser: 9.29 — ABNORMAL HIGH
Glucose, Bld: 87
Glucose, Bld: 98
Phosphorus: 4.9 — ABNORMAL HIGH
Sodium: 138

## 2011-09-18 LAB — I-STAT 8, (EC8 V) (CONVERTED LAB)
Acid-base deficit: 10 — ABNORMAL HIGH
Chloride: 110
Glucose, Bld: 101 — ABNORMAL HIGH
Potassium: 4.5
pH, Ven: 7.262

## 2011-09-18 LAB — CK TOTAL AND CKMB (NOT AT ARMC)
CK, MB: 4.6 — ABNORMAL HIGH
Total CK: 131

## 2011-09-18 LAB — POCT I-STAT CREATININE: Operator id: 146091

## 2011-09-22 LAB — POCT I-STAT, CHEM 8
Calcium, Ion: 1.05 — ABNORMAL LOW
Creatinine, Ser: 13.1 — ABNORMAL HIGH
Hemoglobin: 11.9 — ABNORMAL LOW
Sodium: 137
TCO2: 23

## 2011-10-06 LAB — POCT I-STAT 4, (NA,K, GLUC, HGB,HCT)
HCT: 47
Hemoglobin: 16
Sodium: 134 — ABNORMAL LOW

## 2012-09-06 NOTE — H&P (Signed)
   [name] [name] is an 51 y.o. male.    Chief Complaint:   Right knee OA and pain   HPI: Pt is a 51 y.o. male complaining of right knee pain for years. Pain had continually increased since the beginning. X-rays in the clinic show end-stage arthritic changes of the right knee. Pt has tried various conservative treatments which have failed to alleviate their symptoms, including steroid injections and NSAIDs. Various options are discussed with the patient. Risks, benefits and expectations were discussed with the patient. Patient understand the risks, benefits and expectations and wishes to proceed with surgery.   PCP:  Willis Modena, NP  D/C Plans:  SNF - [place] preferred  Post-op Meds:  No Rx given   Tranexamic Acid:   To be given  Decadron:   To be given  PMH: HTN ESRD  PSH: Cholecystectomy  Social History:  Admits to the use of tobacco. Occasional use of alcohol  Allergies:  No known drug allergies  Medications: Labetalol 300mg  Amlodopine 10mg   Gabapentin 100mg   Nephrovite  ROS: Review of Systems  Constitutional: Negative.   HENT: Negative.   Eyes: Negative.   Respiratory: Negative.   Cardiovascular: Negative.   Gastrointestinal: Negative.   Genitourinary: Negative.   Musculoskeletal: Positive for joint pain.  Skin: Negative.   Neurological: Negative.   Endo/Heme/Allergies: Negative.   Psychiatric/Behavioral: Negative.      Physical Exam: BP:   174/99  ;  HR:   81  ; Resp:   16  : Physical Exam  Constitutional: He is oriented to person, place, and time and well-developed, well-nourished, and in no distress.  HENT:  Head: Normocephalic and atraumatic.  Nose: Nose normal.  Mouth/Throat: Oropharynx is clear and moist.  Eyes: Pupils are equal, round, and reactive to light.  Neck: Neck supple. No JVD present. No tracheal deviation present. No thyromegaly present.  Cardiovascular: Normal rate, regular rhythm and intact distal pulses.   Murmur  heard. Pulmonary/Chest: Effort normal and breath sounds normal. No stridor. No respiratory distress. He has no wheezes.  Abdominal: Soft. There is no tenderness. There is no guarding.  Musculoskeletal:       Right knee: He exhibits decreased range of motion, swelling and bony tenderness. He exhibits no effusion, no ecchymosis, no deformity, no laceration and no erythema. tenderness found.  Lymphadenopathy:    He has no cervical adenopathy.  Neurological: He is alert and oriented to person, place, and time.  Skin: Skin is warm and dry.  Psychiatric: Affect normal.     Assessment/Plan Assessment:   Right knee OA and pain   Plan: Patient will undergo a right total knee arthroplasty on 09/20/2012 per Dr. Charlann Boxer at [facility] do to the need for renal dialysis. Risks benefits and expectations were discussed with the patient. Patient understand risks, benefits and expectations and wishes to proceed.   Anastasio Auerbach Othal Kubitz   PAC  09/06/2012, 2:30 PM

## 2012-09-12 ENCOUNTER — Encounter (HOSPITAL_COMMUNITY)
Admission: RE | Admit: 2012-09-12 | Discharge: 2012-09-12 | Disposition: A | Discharge status: Home / Self Care | Payer: Medicare Other | Source: Ambulatory Visit | Attending: Orthopedic Surgery | Admitting: Orthopedic Surgery

## 2012-09-12 ENCOUNTER — Encounter (HOSPITAL_COMMUNITY): Payer: Self-pay

## 2012-09-12 HISTORY — DX: Essential (primary) hypertension: I10

## 2012-09-12 HISTORY — DX: Unspecified osteoarthritis, unspecified site: M19.90

## 2012-09-12 LAB — CBC
HCT: 36.3 % — ABNORMAL LOW (ref 39.0–52.0)
Hemoglobin: 12.2 g/dL — ABNORMAL LOW (ref 13.0–17.0)
MCH: 29.5 pg (ref 26.0–34.0)
MCHC: 33.6 g/dL (ref 30.0–36.0)
MCV: 87.9 fL (ref 78.0–100.0)

## 2012-09-12 LAB — TYPE AND SCREEN

## 2012-09-12 LAB — URINALYSIS, ROUTINE W REFLEX MICROSCOPIC
Ketones, ur: NEGATIVE mg/dL
Nitrite: NEGATIVE
Specific Gravity, Urine: 1.012 (ref 1.005–1.030)
pH: 8.5 — ABNORMAL HIGH (ref 5.0–8.0)

## 2012-09-12 LAB — BASIC METABOLIC PANEL
BUN: 62 mg/dL — ABNORMAL HIGH (ref 6–23)
CO2: 25 mEq/L (ref 19–32)
Chloride: 93 mEq/L — ABNORMAL LOW (ref 96–112)
Glucose, Bld: 78 mg/dL (ref 70–99)
Potassium: 5.4 mEq/L — ABNORMAL HIGH (ref 3.5–5.1)

## 2012-09-12 LAB — URINE MICROSCOPIC-ADD ON

## 2012-09-12 LAB — PROTIME-INR: INR: 1.15 (ref 0.00–1.49)

## 2012-09-12 NOTE — Progress Notes (Signed)
Pt denies having a cardiologist. 

## 2012-09-12 NOTE — Pre-Procedure Instructions (Signed)
20 [name]  09/12/2012   Your procedure is scheduled on:  Friday, September 27th.  Report to [facility] at 10:30 AM.  Call this number if you have problems the morning of surgery: [phone]   Remember:   Do not eat food or anything to drink:After Midnight.     Take these medicines the morning of surgery with A SIP OF WATER: Labetalol  (Toprol, Metoprolol), Amlodipine (Norvasc).   Do not wear jewelry, make-up or nail polish.  Do not wear lotions, powders, or perfumes. You may wear deodorant.  Do not shave 48 hours prior to surgery. Men may shave face and neck.  Do not bring valuables to the hospital.  Contacts, dentures or bridgework may not be worn into surgery.  Leave suitcase in the car. After surgery it may be brought to your room.  For patients admitted to the hospital, checkout time is 11:00 AM the day of discharge.   Patients discharged the day of surgery will not be allowed to drive home.  Name and phone number of your driver: NA  Special Instructions: Incentive Spirometry - Practice and bring it with you on the day of surgery. and CHG Shower Shower 2 days before surgery and 1 day before surgery with Hibiclens.   Please read over the following fact sheets that you were given: Pain Booklet, Coughing and Deep Breathing, Blood Transfusion Information and Surgical Site Infection Prevention

## 2012-09-13 NOTE — Consult Note (Addendum)
Anesthesia chart review: Patient is a 51 year old male scheduled for right total knee arthroplasty on 09/20/12 by Dr. Charlann Boxer.  History includes ESRD on HD, HTN, smoking, arthritis, history of cocaine use.  Nephrologist Dr. Arrie Aran has cleared patient for this procedure.  EKG on 09/12/12 showed SR with first degree AVB, LAFB, LVH with repolarization abnormality, inferior infarct (age undetermined).  Overall, I think his EKG is stable since 03/08/10.  Chest x-ray report on 09/12/2012 showed no active disease. Cardiomegaly and mild elevation the right hemidiaphragm again noted. Stable degenerative changes of the thoracic spine.  Labs noted.  He is for an ISTAT on the day of surgery.  Echo on 02/19/06 showed: - Overall left ventricular systolic function was at the lower limits of normal. Left ventricular ejection fraction was estimated , range being 45 % to 55 %. There were no left ventricular regional wall motion abnormalities. Left ventricular wall thickness was moderately increased. - The left atrium was markedly dilated. - Right ventricular size was at the upper limits of normal. - Mild tricuspid regurgitation. Trivial mitral valvular regurgitation.  Nuclear stress test on 03/13/03 showed: 1. NEGATIVE FOR ISCHEMIA, ALTHOUGH PATIENT DID NOT ACHIEVE TARGET HEART RATE, WHICH MAY LIMIT THE SENSITIVITY OF THE STUDY.  2. SUSPECT APICAL AND ANTEROAPICAL SCAR.  3. LEFT VENTRICULAR EJECTION FRACTION 42 PERCENT.  Reviewed above with Anesthesiologist Dr. Ivin Booty.  If patient asymptomatic then anticipate he can proceed as planned.  I'll attempt to contact [name] [name] on 09/16/12 to clinically correlate.  Shonna Chock, PA-C 09/13/12 1743  Addendum: 09/17/12 1400 [name] [name] called me back today.  He denies chest pain, SOB.  He denies any recent cardiac testing.  He is tolerating hemodialysis.  His activity is currently significantly limited due to knee pain.  His normal HD schedule is MWF at [facility]  He will actually be doing HD this Wednesday and Thursday since his surgery is scheduled for Friday.  He is aware that he should continue to refrain from cocaine use--last 3-4 weeks ago.

## 2012-09-13 NOTE — Progress Notes (Signed)
Mr Oettinger asked me to call the [facility] with results of MRSA swab.  Swab was postive for Staph.  I called [facility] 09/13/12 and patient had not showed up.  I spoke with Eunice Blase and asked her to tell Mr.Tineo, when he arrives, to have prescription that he received filled.

## 2012-09-19 MED ORDER — CEFAZOLIN SODIUM-DEXTROSE 2-3 GM-% IV SOLR
2.0000 g | INTRAVENOUS | Status: AC
  Administered 2012-09-20: 2 g via INTRAVENOUS
  Filled 2012-09-19: qty 50

## 2012-09-19 MED ORDER — CHLORHEXIDINE GLUCONATE 4 % EX LIQD: 60.0000 mL | Freq: Once | CUTANEOUS | Status: DC

## 2012-09-19 MED ORDER — DEXAMETHASONE SODIUM PHOSPHATE 10 MG/ML IJ SOLN
10.0000 mg | Freq: Once | INTRAMUSCULAR | Status: AC
  Administered 2012-09-20: 10 mg via INTRAVENOUS
  Filled 2012-09-19: qty 1

## 2012-09-19 NOTE — Progress Notes (Signed)
Spoke to [employer]..contact person for Mayra Neer   Regarding time change ... Pt needs to arrive at 1200 noon. She will try to get this message to the patient.

## 2012-09-20 ENCOUNTER — Encounter (HOSPITAL_COMMUNITY)
Admission: RE | Disposition: A | Discharge status: Skilled Nursing Facility | Payer: Self-pay | Source: Ambulatory Visit | Attending: Orthopedic Surgery

## 2012-09-20 ENCOUNTER — Encounter (HOSPITAL_COMMUNITY): Payer: Self-pay | Admitting: *Deleted

## 2012-09-20 ENCOUNTER — Inpatient Hospital Stay (HOSPITAL_COMMUNITY)
Admission: RE | Admit: 2012-09-20 | Discharge: 2012-09-24 | DRG: 469 | Disposition: A | Discharge status: Skilled Nursing Facility | Payer: Medicare Other | Source: Ambulatory Visit | Attending: Orthopedic Surgery | Admitting: Orthopedic Surgery

## 2012-09-20 ENCOUNTER — Encounter (HOSPITAL_COMMUNITY): Payer: Self-pay | Admitting: Vascular Surgery

## 2012-09-20 ENCOUNTER — Inpatient Hospital Stay (HOSPITAL_COMMUNITY): Payer: Medicare Other | Admitting: Vascular Surgery

## 2012-09-20 DIAGNOSIS — M1710 Unilateral primary osteoarthritis, unspecified knee: Principal | ICD-10-CM | POA: Diagnosis present

## 2012-09-20 DIAGNOSIS — Z9119 Patient's noncompliance with other medical treatment and regimen: Secondary | ICD-10-CM

## 2012-09-20 DIAGNOSIS — Z01812 Encounter for preprocedural laboratory examination: Secondary | ICD-10-CM

## 2012-09-20 DIAGNOSIS — Z01818 Encounter for other preprocedural examination: Secondary | ICD-10-CM

## 2012-09-20 DIAGNOSIS — Z9115 Patient's noncompliance with renal dialysis: Secondary | ICD-10-CM

## 2012-09-20 DIAGNOSIS — Z91199 Patient's noncompliance with other medical treatment and regimen due to unspecified reason: Secondary | ICD-10-CM

## 2012-09-20 DIAGNOSIS — Z23 Encounter for immunization: Secondary | ICD-10-CM

## 2012-09-20 DIAGNOSIS — Z91158 Patient's noncompliance with renal dialysis for other reason: Secondary | ICD-10-CM

## 2012-09-20 DIAGNOSIS — F1410 Cocaine abuse, uncomplicated: Secondary | ICD-10-CM | POA: Diagnosis present

## 2012-09-20 DIAGNOSIS — E669 Obesity, unspecified: Secondary | ICD-10-CM | POA: Diagnosis present

## 2012-09-20 DIAGNOSIS — N186 End stage renal disease: Secondary | ICD-10-CM | POA: Diagnosis present

## 2012-09-20 DIAGNOSIS — Z96659 Presence of unspecified artificial knee joint: Secondary | ICD-10-CM

## 2012-09-20 DIAGNOSIS — Z992 Dependence on renal dialysis: Secondary | ICD-10-CM

## 2012-09-20 DIAGNOSIS — Z6841 Body Mass Index (BMI) 40.0 and over, adult: Secondary | ICD-10-CM

## 2012-09-20 DIAGNOSIS — I120 Hypertensive chronic kidney disease with stage 5 chronic kidney disease or end stage renal disease: Secondary | ICD-10-CM | POA: Diagnosis present

## 2012-09-20 DIAGNOSIS — N2581 Secondary hyperparathyroidism of renal origin: Secondary | ICD-10-CM | POA: Diagnosis present

## 2012-09-20 DIAGNOSIS — F17200 Nicotine dependence, unspecified, uncomplicated: Secondary | ICD-10-CM | POA: Diagnosis present

## 2012-09-20 DIAGNOSIS — Z01811 Encounter for preprocedural respiratory examination: Secondary | ICD-10-CM

## 2012-09-20 DIAGNOSIS — Z01810 Encounter for preprocedural cardiovascular examination: Secondary | ICD-10-CM

## 2012-09-20 DIAGNOSIS — Z79899 Other long term (current) drug therapy: Secondary | ICD-10-CM

## 2012-09-20 DIAGNOSIS — D649 Anemia, unspecified: Secondary | ICD-10-CM | POA: Diagnosis present

## 2012-09-20 HISTORY — PX: TOTAL KNEE ARTHROPLASTY: SHX125

## 2012-09-20 HISTORY — PX: JOINT REPLACEMENT: SHX530

## 2012-09-20 LAB — POCT I-STAT 4, (NA,K, GLUC, HGB,HCT)
HCT: 39 % (ref 39.0–52.0)
Hemoglobin: 13.3 g/dL (ref 13.0–17.0)
Potassium: 5.2 mEq/L — ABNORMAL HIGH (ref 3.5–5.1)
Sodium: 135 mEq/L (ref 135–145)

## 2012-09-20 SURGERY — ARTHROPLASTY, KNEE, TOTAL
Anesthesia: General | Site: Knee | Laterality: Right | Wound class: Clean

## 2012-09-20 MED ORDER — BUPIVACAINE-EPINEPHRINE PF 0.25-1:200000 % IJ SOLN
INTRAMUSCULAR | Status: DC | PRN
  Administered 2012-09-20: 30 mL

## 2012-09-20 MED ORDER — CEFAZOLIN SODIUM-DEXTROSE 2-3 GM-% IV SOLR
2.0000 g | Freq: Four times a day (QID) | INTRAVENOUS | Status: AC
  Administered 2012-09-20 – 2012-09-21 (×2): 2 g via INTRAVENOUS
  Filled 2012-09-20 (×3): qty 50

## 2012-09-20 MED ORDER — ONDANSETRON HCL 4 MG PO TABS: 4.0000 mg | ORAL_TABLET | Freq: Four times a day (QID) | ORAL | Status: DC | PRN

## 2012-09-20 MED ORDER — SODIUM CHLORIDE 0.9 % IV SOLN
INTRAVENOUS | Status: DC | PRN
  Administered 2012-09-20 (×2): via INTRAVENOUS

## 2012-09-20 MED ORDER — ANTICOAGULANT SODIUM CITRATE 4% (200MG/5ML) IV SOLN
5.0000 mL | Status: DC | PRN
  Filled 2012-09-20: qty 250

## 2012-09-20 MED ORDER — MIDAZOLAM HCL 5 MG/5ML IJ SOLN
INTRAMUSCULAR | Status: DC | PRN
  Administered 2012-09-20: 1 mg via INTRAVENOUS

## 2012-09-20 MED ORDER — POLYETHYLENE GLYCOL 3350 17 G PO PACK
17.0000 g | PACK | Freq: Two times a day (BID) | ORAL | Status: DC
  Administered 2012-09-22 – 2012-09-24 (×3): 17 g via ORAL
  Filled 2012-09-20 (×9): qty 1

## 2012-09-20 MED ORDER — SODIUM CHLORIDE 0.9 % IV SOLN: 100.0000 mL | INTRAVENOUS | Status: DC | PRN

## 2012-09-20 MED ORDER — PARICALCITOL 5 MCG/ML IV SOLN
7.0000 ug | Freq: Once | INTRAVENOUS | Status: AC
  Administered 2012-09-21: 7 ug via INTRAVENOUS
  Filled 2012-09-20: qty 1.4

## 2012-09-20 MED ORDER — SODIUM CHLORIDE 0.9 % IV SOLN
62.5000 mg | Freq: Once | INTRAVENOUS | Status: DC
  Filled 2012-09-20: qty 5

## 2012-09-20 MED ORDER — SORBITOL 70 % SOLN: 30.0000 mL | Status: DC | PRN

## 2012-09-20 MED ORDER — MUPIROCIN 2 % EX OINT
TOPICAL_OINTMENT | CUTANEOUS | Status: AC
  Filled 2012-09-20: qty 22

## 2012-09-20 MED ORDER — TRANEXAMIC ACID 100 MG/ML IV SOLN: 15.0000 mg/kg | Freq: Once | INTRAVENOUS | Status: DC

## 2012-09-20 MED ORDER — SODIUM CHLORIDE 0.9 % IV SOLN
INTRAVENOUS | Status: DC
  Administered 2012-09-20 – 2012-09-22 (×2): via INTRAVENOUS
  Filled 2012-09-20 (×11): qty 1000

## 2012-09-20 MED ORDER — ACETAMINOPHEN 325 MG PO TABS: 650.0000 mg | ORAL_TABLET | Freq: Four times a day (QID) | ORAL | Status: DC | PRN

## 2012-09-20 MED ORDER — METHOCARBAMOL 500 MG PO TABS
500.0000 mg | ORAL_TABLET | Freq: Four times a day (QID) | ORAL | Status: DC | PRN
  Administered 2012-09-20 – 2012-09-23 (×2): 500 mg via ORAL
  Filled 2012-09-20 (×2): qty 1

## 2012-09-20 MED ORDER — CALCIUM ACETATE 667 MG PO CAPS
2668.0000 mg | ORAL_CAPSULE | Freq: Three times a day (TID) | ORAL | Status: DC
  Administered 2012-09-21 – 2012-09-24 (×8): 2668 mg via ORAL
  Filled 2012-09-20 (×13): qty 4

## 2012-09-20 MED ORDER — LIDOCAINE-PRILOCAINE 2.5-2.5 % EX CREA: 1.0000 "application " | TOPICAL_CREAM | CUTANEOUS | Status: DC | PRN

## 2012-09-20 MED ORDER — PROMETHAZINE HCL 25 MG RE SUPP
25.0000 mg | Freq: Once | RECTAL | Status: DC | PRN
  Filled 2012-09-20: qty 1

## 2012-09-20 MED ORDER — RIVAROXABAN 10 MG PO TABS
10.0000 mg | ORAL_TABLET | ORAL | Status: DC
  Filled 2012-09-20: qty 1

## 2012-09-20 MED ORDER — PENTAFLUOROPROP-TETRAFLUOROETH EX AERO: 1.0000 "application " | INHALATION_SPRAY | CUTANEOUS | Status: DC | PRN

## 2012-09-20 MED ORDER — ACETAMINOPHEN 10 MG/ML IV SOLN
INTRAVENOUS | Status: AC
  Filled 2012-09-20: qty 100

## 2012-09-20 MED ORDER — LABETALOL HCL 300 MG PO TABS
300.0000 mg | ORAL_TABLET | Freq: Two times a day (BID) | ORAL | Status: DC
  Administered 2012-09-20 – 2012-09-24 (×4): 300 mg via ORAL
  Filled 2012-09-20 (×9): qty 1

## 2012-09-20 MED ORDER — ENOXAPARIN SODIUM 40 MG/0.4ML ~~LOC~~ SOLN
40.0000 mg | SUBCUTANEOUS | Status: DC
  Administered 2012-09-21 – 2012-09-24 (×4): 40 mg via SUBCUTANEOUS
  Filled 2012-09-20 (×5): qty 0.4

## 2012-09-20 MED ORDER — 0.9 % SODIUM CHLORIDE (POUR BTL) OPTIME
TOPICAL | Status: DC | PRN
  Administered 2012-09-20: 1000 mL

## 2012-09-20 MED ORDER — DOCUSATE SODIUM 283 MG RE ENEM: 1.0000 | ENEMA | RECTAL | Status: DC | PRN

## 2012-09-20 MED ORDER — NEPRO/CARBSTEADY PO LIQD: 237.0000 mL | ORAL | Status: DC | PRN

## 2012-09-20 MED ORDER — HYDROMORPHONE HCL PF 1 MG/ML IJ SOLN
0.5000 mg | INTRAMUSCULAR | Status: DC | PRN
  Administered 2012-09-21 (×3): 1 mg via INTRAVENOUS
  Filled 2012-09-20: qty 1

## 2012-09-20 MED ORDER — DOCUSATE SODIUM 100 MG PO CAPS
100.0000 mg | ORAL_CAPSULE | Freq: Two times a day (BID) | ORAL | Status: DC
  Administered 2012-09-20 – 2012-09-24 (×7): 100 mg via ORAL
  Filled 2012-09-20 (×9): qty 1

## 2012-09-20 MED ORDER — GLYCOPYRROLATE 0.2 MG/ML IJ SOLN
INTRAMUSCULAR | Status: DC | PRN
  Administered 2012-09-20: .5 mg via INTRAVENOUS

## 2012-09-20 MED ORDER — SODIUM CHLORIDE 0.9 % IV SOLN
62.5000 mg | Freq: Once | INTRAVENOUS | Status: AC
  Administered 2012-09-21: 62.5 mg via INTRAVENOUS
  Filled 2012-09-20 (×2): qty 5

## 2012-09-20 MED ORDER — DIPHENHYDRAMINE HCL 25 MG PO CAPS: 25.0000 mg | ORAL_CAPSULE | Freq: Four times a day (QID) | ORAL | Status: DC | PRN

## 2012-09-20 MED ORDER — MEPERIDINE HCL 25 MG/ML IJ SOLN: 6.2500 mg | INTRAMUSCULAR | Status: DC | PRN

## 2012-09-20 MED ORDER — ALUM & MAG HYDROXIDE-SIMETH 200-200-20 MG/5ML PO SUSP
30.0000 mL | ORAL | Status: DC | PRN
  Filled 2012-09-20: qty 30

## 2012-09-20 MED ORDER — MUPIROCIN 2 % EX OINT
TOPICAL_OINTMENT | Freq: Two times a day (BID) | CUTANEOUS | Status: DC
  Administered 2012-09-20 – 2012-09-24 (×8): via NASAL
  Filled 2012-09-20: qty 22

## 2012-09-20 MED ORDER — CAMPHOR-MENTHOL 0.5-0.5 % EX LOTN
1.0000 "application " | TOPICAL_LOTION | Freq: Three times a day (TID) | CUTANEOUS | Status: DC | PRN
  Filled 2012-09-20: qty 222

## 2012-09-20 MED ORDER — BUPIVACAINE-EPINEPHRINE PF 0.25-1:200000 % IJ SOLN
INTRAMUSCULAR | Status: AC
  Filled 2012-09-20: qty 60

## 2012-09-20 MED ORDER — FERROUS SULFATE 325 (65 FE) MG PO TABS
325.0000 mg | ORAL_TABLET | Freq: Three times a day (TID) | ORAL | Status: DC
  Administered 2012-09-20 – 2012-09-24 (×11): 325 mg via ORAL
  Filled 2012-09-20 (×14): qty 1

## 2012-09-20 MED ORDER — CALCIUM CARBONATE 1250 MG/5ML PO SUSP: 500.0000 mg | Freq: Four times a day (QID) | ORAL | Status: DC | PRN

## 2012-09-20 MED ORDER — ONDANSETRON HCL 4 MG/2ML IJ SOLN: 4.0000 mg | Freq: Four times a day (QID) | INTRAMUSCULAR | Status: DC | PRN

## 2012-09-20 MED ORDER — PROPOFOL 10 MG/ML IV BOLUS
INTRAVENOUS | Status: DC | PRN
  Administered 2012-09-20: 200 mg via INTRAVENOUS

## 2012-09-20 MED ORDER — OXYCODONE HCL 5 MG PO TABS: 5.0000 mg | ORAL_TABLET | Freq: Once | ORAL | Status: DC | PRN

## 2012-09-20 MED ORDER — SODIUM CHLORIDE 0.9 % IR SOLN
Status: DC | PRN
  Administered 2012-09-20: 3000 mL

## 2012-09-20 MED ORDER — LIDOCAINE HCL (CARDIAC) 20 MG/ML IV SOLN
INTRAVENOUS | Status: DC | PRN
  Administered 2012-09-20: 100 mg via INTRAVENOUS

## 2012-09-20 MED ORDER — ONDANSETRON HCL 4 MG/2ML IJ SOLN
INTRAMUSCULAR | Status: DC | PRN
  Administered 2012-09-20: 4 mg via INTRAVENOUS

## 2012-09-20 MED ORDER — HYDROMORPHONE HCL PF 1 MG/ML IJ SOLN
INTRAMUSCULAR | Status: AC
  Administered 2012-09-20: 0.5 mg via INTRAVENOUS
  Filled 2012-09-20: qty 1

## 2012-09-20 MED ORDER — PHENYLEPHRINE HCL 10 MG/ML IJ SOLN
10.0000 mg | INTRAVENOUS | Status: DC | PRN
  Administered 2012-09-20: 5 ug/min via INTRAVENOUS

## 2012-09-20 MED ORDER — ZOLPIDEM TARTRATE 5 MG PO TABS: 5.0000 mg | ORAL_TABLET | Freq: Every evening | ORAL | Status: DC | PRN

## 2012-09-20 MED ORDER — BUPIVACAINE-EPINEPHRINE PF 0.5-1:200000 % IJ SOLN
INTRAMUSCULAR | Status: DC | PRN
  Administered 2012-09-20: 30 mL

## 2012-09-20 MED ORDER — AMLODIPINE BESYLATE 10 MG PO TABS
10.0000 mg | ORAL_TABLET | Freq: Every day | ORAL | Status: DC
  Administered 2012-09-20 – 2012-09-24 (×4): 10 mg via ORAL
  Filled 2012-09-20 (×5): qty 1

## 2012-09-20 MED ORDER — OXYCODONE HCL 5 MG/5ML PO SOLN: 5.0000 mg | Freq: Once | ORAL | Status: DC | PRN

## 2012-09-20 MED ORDER — PHENOL 1.4 % MT LIQD
1.0000 | OROMUCOSAL | Status: DC | PRN
  Filled 2012-09-20: qty 177

## 2012-09-20 MED ORDER — HYDROXYZINE HCL 25 MG PO TABS: 25.0000 mg | ORAL_TABLET | Freq: Three times a day (TID) | ORAL | Status: DC | PRN

## 2012-09-20 MED ORDER — HYDROCODONE-ACETAMINOPHEN 7.5-325 MG PO TABS
1.0000 | ORAL_TABLET | ORAL | Status: DC
  Administered 2012-09-20 (×2): 1 via ORAL
  Administered 2012-09-21 (×2): 2 via ORAL
  Administered 2012-09-21: 1 via ORAL
  Administered 2012-09-22 – 2012-09-24 (×12): 2 via ORAL
  Filled 2012-09-20: qty 2
  Filled 2012-09-20: qty 1
  Filled 2012-09-20 (×14): qty 2

## 2012-09-20 MED ORDER — SODIUM CHLORIDE 0.9 % IV SOLN: 62.5000 mg | INTRAVENOUS | Status: DC

## 2012-09-20 MED ORDER — ROCURONIUM BROMIDE 100 MG/10ML IV SOLN
INTRAVENOUS | Status: DC | PRN
  Administered 2012-09-20: 20 mg via INTRAVENOUS

## 2012-09-20 MED ORDER — MENTHOL 3 MG MT LOZG
1.0000 | LOZENGE | OROMUCOSAL | Status: DC | PRN
  Filled 2012-09-20: qty 9

## 2012-09-20 MED ORDER — LIDOCAINE HCL (PF) 1 % IJ SOLN: 5.0000 mL | INTRAMUSCULAR | Status: DC | PRN

## 2012-09-20 MED ORDER — HYDROMORPHONE HCL PF 1 MG/ML IJ SOLN
0.2500 mg | INTRAMUSCULAR | Status: DC | PRN
  Administered 2012-09-20 (×2): 0.5 mg via INTRAVENOUS

## 2012-09-20 MED ORDER — FENTANYL CITRATE 0.05 MG/ML IJ SOLN
INTRAMUSCULAR | Status: AC
  Filled 2012-09-20: qty 2

## 2012-09-20 MED ORDER — ACETAMINOPHEN 10 MG/ML IV SOLN
INTRAVENOUS | Status: DC | PRN
  Administered 2012-09-20: 1000 mg via INTRAVENOUS

## 2012-09-20 MED ORDER — MIDAZOLAM HCL 2 MG/2ML IJ SOLN
INTRAMUSCULAR | Status: AC
  Filled 2012-09-20: qty 2

## 2012-09-20 MED ORDER — LABETALOL HCL 300 MG PO TABS
300.0000 mg | ORAL_TABLET | ORAL | Status: AC
  Administered 2012-09-20: 300 mg via ORAL
  Filled 2012-09-20: qty 1

## 2012-09-20 MED ORDER — FENTANYL CITRATE 0.05 MG/ML IJ SOLN
INTRAMUSCULAR | Status: DC | PRN
  Administered 2012-09-20 (×2): 50 ug via INTRAVENOUS
  Administered 2012-09-20: 100 ug via INTRAVENOUS
  Administered 2012-09-20: 50 ug via INTRAVENOUS

## 2012-09-20 MED ORDER — SUCCINYLCHOLINE CHLORIDE 20 MG/ML IJ SOLN
INTRAMUSCULAR | Status: DC | PRN
  Administered 2012-09-20: 100 mg via INTRAVENOUS

## 2012-09-20 MED ORDER — SODIUM CHLORIDE 0.9 % IV SOLN
INTRAVENOUS | Status: DC
  Administered 2012-09-20: 14:00:00 via INTRAVENOUS

## 2012-09-20 MED ORDER — PARICALCITOL 5 MCG/ML IV SOLN
7.0000 ug | INTRAVENOUS | Status: AC
  Administered 2012-09-23: 7 ug via INTRAVENOUS
  Filled 2012-09-20: qty 1.4

## 2012-09-20 MED ORDER — ACETAMINOPHEN 650 MG RE SUPP: 650.0000 mg | Freq: Four times a day (QID) | RECTAL | Status: DC | PRN

## 2012-09-20 MED ORDER — TRANEXAMIC ACID 100 MG/ML IV SOLN
15.0000 mg/kg | INTRAVENOUS | Status: DC
  Filled 2012-09-20: qty 9.42

## 2012-09-20 MED ORDER — CALCIUM ACETATE 667 MG PO CAPS
1334.0000 mg | ORAL_CAPSULE | Freq: Two times a day (BID) | ORAL | Status: DC
  Administered 2012-09-20 – 2012-09-23 (×4): 1334 mg via ORAL
  Filled 2012-09-20 (×9): qty 2

## 2012-09-20 MED ORDER — NEOSTIGMINE METHYLSULFATE 1 MG/ML IJ SOLN
INTRAMUSCULAR | Status: DC | PRN
  Administered 2012-09-20: 2.5 mg via INTRAVENOUS

## 2012-09-20 MED ORDER — PARICALCITOL 5 MCG/ML IV SOLN
7.0000 ug | Freq: Once | INTRAVENOUS | Status: DC
  Filled 2012-09-20: qty 1.4

## 2012-09-20 MED ORDER — BISACODYL 10 MG RE SUPP: 10.0000 mg | Freq: Every day | RECTAL | Status: DC | PRN

## 2012-09-20 MED ORDER — METHOCARBAMOL 100 MG/ML IJ SOLN
500.0000 mg | Freq: Four times a day (QID) | INTRAVENOUS | Status: DC | PRN
  Filled 2012-09-20: qty 5

## 2012-09-20 MED ORDER — FLEET ENEMA 7-19 GM/118ML RE ENEM
1.0000 | ENEMA | Freq: Once | RECTAL | Status: AC | PRN
  Filled 2012-09-20: qty 1

## 2012-09-20 SURGICAL SUPPLY — 76 items
AUGMENT DIST PFC SIGMA 4MM RT (Knees) ×2 IMPLANT
BANDAGE ESMARK 6X9 LF (GAUZE/BANDAGES/DRESSINGS) ×1 IMPLANT
BLADE SAW SGTL 13.0X1.19X90.0M (BLADE) ×2 IMPLANT
BLADE SAW SGTL 13X75X1.27 (BLADE) ×2 IMPLANT
BNDG ELASTIC 6X10 VLCR STRL LF (GAUZE/BANDAGES/DRESSINGS) ×2 IMPLANT
BNDG ESMARK 6X9 LF (GAUZE/BANDAGES/DRESSINGS) ×2
BOWL SMART MIX CTS (DISPOSABLE) ×2 IMPLANT
CEMENT HV SMART SET (Cement) ×4 IMPLANT
CLOTH BEACON ORANGE TIMEOUT ST (SAFETY) ×2 IMPLANT
COVER SURGICAL LIGHT HANDLE (MISCELLANEOUS) ×2 IMPLANT
CUFF TOURNIQUET SINGLE 34IN LL (TOURNIQUET CUFF) ×2 IMPLANT
CUFF TOURNIQUET SINGLE 44IN (TOURNIQUET CUFF) IMPLANT
DERMABOND ADVANCED (GAUZE/BANDAGES/DRESSINGS) ×1
DERMABOND ADVANCED .7 DNX12 (GAUZE/BANDAGES/DRESSINGS) ×1 IMPLANT
DIS AUG PFC SIGMA 4MM RIGHT (Knees) ×4 IMPLANT
DRAPE EXTREMITY T 121X128X90 (DRAPE) ×2 IMPLANT
DRAPE POUCH INSTRU U-SHP 10X18 (DRAPES) ×2 IMPLANT
DRAPE U-SHAPE 47X51 STRL (DRAPES) ×2 IMPLANT
DRSG ADAPTIC 3X8 NADH LF (GAUZE/BANDAGES/DRESSINGS) ×2 IMPLANT
DRSG AQUACEL AG ADV 3.5X10 (GAUZE/BANDAGES/DRESSINGS) ×2 IMPLANT
DRSG PAD ABDOMINAL 8X10 ST (GAUZE/BANDAGES/DRESSINGS) ×4 IMPLANT
DRSG TEGADERM 4X4.75 (GAUZE/BANDAGES/DRESSINGS) ×2 IMPLANT
ELECT REM PT RETURN 9FT ADLT (ELECTROSURGICAL) ×2
ELECTRODE REM PT RTRN 9FT ADLT (ELECTROSURGICAL) ×1 IMPLANT
FACESHIELD LNG OPTICON STERILE (SAFETY) ×2 IMPLANT
FACESHIELD STD STERILE (MASK) ×4 IMPLANT
FLOSEAL 10ML (HEMOSTASIS) IMPLANT
GAUZE SPONGE 2X2 8PLY STRL LF (GAUZE/BANDAGES/DRESSINGS) ×1 IMPLANT
GLOVE BIOGEL PI IND STRL 7.5 (GLOVE) ×1 IMPLANT
GLOVE BIOGEL PI IND STRL 8 (GLOVE) ×1 IMPLANT
GLOVE BIOGEL PI INDICATOR 7.5 (GLOVE) ×1
GLOVE BIOGEL PI INDICATOR 8 (GLOVE) ×1
GLOVE ECLIPSE 8.0 STRL XLNG CF (GLOVE) ×2 IMPLANT
GLOVE ORTHO TXT STRL SZ7.5 (GLOVE) ×4 IMPLANT
GLOVE SURG ORTHO 8.0 STRL STRW (GLOVE) ×2 IMPLANT
GOWN STRL NON-REIN LRG LVL3 (GOWN DISPOSABLE) ×4 IMPLANT
HANDPIECE INTERPULSE COAX TIP (DISPOSABLE) ×1
IMMOBILIZER KNEE 20 (SOFTGOODS)
IMMOBILIZER KNEE 20 THIGH 36 (SOFTGOODS) IMPLANT
IMMOBILIZER KNEE 22 UNIV (SOFTGOODS) ×2 IMPLANT
IMMOBILIZER KNEE 24 THIGH 36 (MISCELLANEOUS) IMPLANT
IMMOBILIZER KNEE 24 UNIV (MISCELLANEOUS)
KIT BASIN OR (CUSTOM PROCEDURE TRAY) ×2 IMPLANT
KIT ROOM TURNOVER OR (KITS) ×2 IMPLANT
MANIFOLD NEPTUNE II (INSTRUMENTS) ×2 IMPLANT
MARKER SPHERE PSV REFLC THRD 5 (MARKER) ×6 IMPLANT
NEEDLE 18GX1X1/2 (RX/OR ONLY) (NEEDLE) ×4 IMPLANT
NS IRRIG 1000ML POUR BTL (IV SOLUTION) ×2 IMPLANT
PACK TOTAL JOINT (CUSTOM PROCEDURE TRAY) ×2 IMPLANT
PAD ARMBOARD 7.5X6 YLW CONV (MISCELLANEOUS) ×4 IMPLANT
PADDING CAST COTTON 6X4 STRL (CAST SUPPLIES) ×4 IMPLANT
PATELLA DOME PFC 41MM (Knees) ×2 IMPLANT
PIN SCHANZ 4MM 130MM (PIN) IMPLANT
PLATE ROT INSERT 12.5MM (Plate) ×2 IMPLANT
SET HNDPC FAN SPRY TIP SCT (DISPOSABLE) ×1 IMPLANT
SPONGE GAUZE 2X2 STER 10/PKG (GAUZE/BANDAGES/DRESSINGS) ×1
SPONGE GAUZE 4X4 12PLY (GAUZE/BANDAGES/DRESSINGS) ×2 IMPLANT
SPONGE LAP 18X18 X RAY DECT (DISPOSABLE) IMPLANT
STRIP CLOSURE SKIN 1/2X4 (GAUZE/BANDAGES/DRESSINGS) ×4 IMPLANT
SUCTION FRAZIER TIP 10 FR DISP (SUCTIONS) ×2 IMPLANT
SUT MNCRL AB 4-0 PS2 18 (SUTURE) ×2 IMPLANT
SUT VIC AB 1 CT1 27 (SUTURE) ×3
SUT VIC AB 1 CT1 27XBRD ANBCTR (SUTURE) ×3 IMPLANT
SUT VIC AB 2-0 CT1 27 (SUTURE) ×3
SUT VIC AB 2-0 CT1 TAPERPNT 27 (SUTURE) ×3 IMPLANT
SUT VLOC 180 0 24IN GS25 (SUTURE) ×2 IMPLANT
SYR 3ML LL SCALE MARK (SYRINGE) ×2 IMPLANT
SYR 50ML SLIP (SYRINGE) ×2 IMPLANT
TOWEL OR 17X24 6PK STRL BLUE (TOWEL DISPOSABLE) ×2 IMPLANT
TOWEL OR 17X26 10 PK STRL BLUE (TOWEL DISPOSABLE) ×2 IMPLANT
TRAY FOLEY CATH 14FR (SET/KITS/TRAYS/PACK) ×2 IMPLANT
TRAY TIB SZ 5 REVISION (Knees) ×2 IMPLANT
WATER STERILE IRR 1000ML POUR (IV SOLUTION) ×6 IMPLANT
WEDGE SZ 5 5MM (Knees) ×4 IMPLANT
WRAP KNEE MAXI GEL POST OP (GAUZE/BANDAGES/DRESSINGS) ×2 IMPLANT
size 5 right posterior stabilized femur (Orthopedic Implant) ×2 IMPLANT

## 2012-09-20 NOTE — Brief Op Note (Signed)
09/20/2012  6:12 PM  PATIENT:  [name] [name]  51 y.o. male  PRE-OPERATIVE DIAGNOSIS:  RIGHT KNEE OA  POST-OPERATIVE DIAGNOSIS:  RIGHT KNEE OA  PROCEDURE:  Procedure(s) (LRB) with comments: TOTAL KNEE ARTHROPLASTY (Right) - right total knee arthroplasty  SURGEON:  Surgeon(s) and Role:    * [name], MD - Primary  PHYSICIAN ASSISTANT: Lanney Gins, PA-C  ANESTHESIA:   general  EBL:  Total I/O In: 750 [I.V.:750] Out: 210 [Urine:160; Blood:50]  BLOOD ADMINISTERED:none  DRAINS: (1 Medium) Hemovact drain(s) in the right knee with  Suction Open   LOCAL MEDICATIONS USED:  MARCAINE     SPECIMEN:  No Specimen  DISPOSITION OF SPECIMEN:  N/A  COUNTS:  YES  TOURNIQUET:   Total Tourniquet Time Documented: Thigh (Right) - 53 minutes  DICTATION: .Other Dictation: Dictation Number [id number]  PLAN OF CARE: Admit to inpatient   PATIENT DISPOSITION:  PACU - hemodynamically stable.   Delay start of Pharmacological VTE agent (>24hrs) due to surgical blood loss or risk of bleeding: no

## 2012-09-20 NOTE — Consult Note (Signed)
[facility] Renal Consultation Note    Indication for Consultation:  Management of ESRD/hemodialysis; anemia, hypertension/volume and secondary hyperparathyroidism  HPI: [name] [name] is a 51 y.o. AA male with ESRD secondary to HTN on HD since April 2008 with hx of medical and dialysis noncompliance.  He has a long history of arthritis of the knees R> L pain and states the shots don't hurt any more.  It interferes with his walking which he says contributes to why he misses HD.  He continues to smoke and use crack occasionally. He has no fever, chills, diarrhea or constipation.He still makes urine and has not dysuria.  Energy is fair, appetite ok.  He has large volume gains between HD treatments because he misses about 50% of his hemodialysis treatments.  Past Medical History  Diagnosis Date  . Hypertension   . Chronic kidney disease     ESRD  . Arthritis    Past Surgical History  Procedure Date  . Av fistula placement     Left x 2  . Cholecystectomy    History reviewed. No pertinent family history. Social History: Never married. Two children. Moved to [place] in 1994 from [place]  reports that he has been smoking.  He does not have any smokeless tobacco history on file. He reports that he uses illicit drugs (Cocaine). He reports that he does not drink alcohol. No Known Allergies Prior to Admission medications  WHILE THESE MAY BE THE MEDS HE IS SUPPOSED TO TAKE, HE OPENLY ADMITS TO ONLY TAKING PHOSLO AND THAT IS NOT ALWAYS DONE REGULARLY  Medication Sig Start Date End Date Taking? Authorizing Provider  amLODipine (NORVASC) 10 MG tablet Take 10 mg by mouth daily.   Yes Historical Provider, MD  calcium acetate (PHOSLO) 667 MG capsule Take 2,668 mg by mouth 3 (three) times daily with meals.   Yes Historical Provider, MD  calcium acetate (PHOSLO) 667 MG capsule Take 1,334 mg by mouth 2 (two) times daily.   Yes Historical Provider, MD  labetalol (NORMODYNE) 300 MG tablet Take  300 mg by mouth 2 (two) times daily.   Yes Historical Provider, MD  cinacalcet (SENSIPAR) 60 MG tablet Take 60 mg by mouth daily.    Historical Provider, MD  gabapentin (NEURONTIN) 100 MG capsule Take 100 mg by mouth at bedtime.    Historical Provider, MD   Current Facility-Administered Medications  Medication Dose Route Frequency Provider Last Rate Last Dose  . ceFAZolin (ANCEF) IVPB 2 g/50 mL premix  2 g Intravenous 60 min Pre-Op Genelle Gather Babish, PA      . chlorhexidine (HIBICLENS) 4 % liquid 4 application  60 mL Topical Once Genelle Gather Babish, PA      . dexamethasone (DECADRON) injection 10 mg  10 mg Intravenous Once Genelle Gather Babish, PA      . labetalol (NORMODYNE) tablet 300 mg  300 mg Oral To 282 [name], MD   300 mg at 09/20/12 1053  . mupirocin ointment (BACTROBAN) 2 %   Nasal BID [name], MD      . tranexamic acid (CYKLOLAPRON) 942 mg in sodium chloride 0.9 % 100 mL IVPB  15 mg/kg (Order-Specific) Intravenous To OR [name], MD      . DISCONTD: tranexamic acid (CYKLOLAPRON) 15 mg/kg in sodium chloride 0.9 % 100 mL IVPB  15 mg/kg Intravenous Once Gerrit Halls, PA       Labs: Basic Metabolic Panel:  Lab 09/20/12 1308  NA 135  K 5.2*  CL --  CO2 --  GLUCOSE 83  BUN --  CREATININE --  CALCIUM --  ALB --  PHOS --  CBC:  Lab 09/20/12 1026  WBC --  NEUTROABS --  HGB 13.3  HCT 39.0  MCV --  PLT --   ROS: as per HPI otherwise negative  Physical Exam: Filed Vitals:   09/20/12 1035  BP: 172/94  Pulse: 73  Temp: 97.9 F (36.6 C)  TempSrc: Oral  Resp: 18  SpO2: 98%     General: Well developed, well nourished obese, in no acute distress lying on stretcher waiting for surgery. Head: Normocephalic, atraumatic, sclera non-icteric, mucus membranes are moist;  Neck: Supple. JVD not elevated. Lungs: Coarse BS at bases. Breathing is unlabored. Heart: RRR with S1 S2. Bruit from large upper AVF radiates to the heart Abdomen: Soft  obese, non-tender, non-distended with normoactive bowel sounds. No rebound/guarding. No obvious abdominal masses. M-S:  Strength and tone appear normal for age. Lower extremities:without edema or ischemic changes, no open wounds; left LE TEDS in place Neuro: Alert and oriented X 3. Moves all extremities spontaneously. Psych:  Responds to questions appropriately with a normal affect. Dialysis Access: upper left large  AVF patent - with two slightly denuded areas and scab on distal site.  Dialysis Orders: Center: [facility] on MWF  [address] EDW 133- never gets to EDW; signs off early and misses HD therefore volumes are huge HD Bath 2K 2 Ca  Time 4.5 Heparin 9000 to start and 4000 mid treatment. Access left upper AVF BFR 500 DFR 800     Zemplar 7 mcg IV/HD Epogen none  Venofer  50/week Fri Hgb 12.1;   Assessment/Plan: 1.  right total knee replacement - per Dr. Charlann Boxer - will use no heparin HD Saturday; will need frequently flushing of lines as he is used to high heparin doses; pt tells me he will d/c to NH for short term rehab 2.  ESRD - MWF [facility] with hx irregular attendance.  However, he has come Wed and Thursday this week in anticipation of surgery.  He will be dialyzed in the am and get back on MWF schedule next week. Istat K 5.2 today - recheck post op will need to be ordered after surgery. 3.  Hypertension/volume  - REFUSES to take BP meds on a regular basis as an outpt in spite of importance.  Will give here. Evaluate post HD BP and if any meds ordered- has Norvasc and labetalol on his med list.  Given BB pre-op.  CXR 9/19 neg for volume 4.  Anemia  - no ESA needed yet. Check Hgb pre HD Sat. Weekly IV  Fe give Friday dose on Sat. May need to initiate low dose Aranesp if significant drop 5.  Metabolic bone disease -  Uncontrolled due to refusal to take meds consistantly. Will not take sensipar.  Continue usual Phoslo after admission. and vit D; Last iPTH 1853 which is not helping his arthritic pain  either. 6.  Nutrition - will need high protein renal diet. 7. Medical and dialysis noncompliance - continue to encourage meds and attendance; For example, attended 7 of 13 treatments during the month of August with an average HD time of 228 of prescribed 270 minutes and average post wt of 135.6 on the days he attended. He has been kicked off some of the transportation services due to failure to no show.  Subsequently he has had to ride the city bus and walk  long distances which has been difficult due to his knee pain.  Once he is out of rehab, I'm not sure he'll be able to walk these long distances initially. 8. Tobacco and cocaine abuse - encourage abstinence; likely has underlying depression; noncompliance with meds likely to make offering any pharmacological assist useless.  Sheffield Slider, PA-C [facility] Beeper [phone] 09/20/2012, 12:56 PM   Patient seen and examined and agree with assessment and plan as above.  Vinson Moselle  MD [employer] [phone] pgr    [phone] cell 09/20/2012, 3:12 PM

## 2012-09-20 NOTE — Anesthesia Preprocedure Evaluation (Addendum)
Anesthesia Evaluation  Patient identified by MRN, date of birth, ID band Patient awake  General Assessment Comment:Known hx of noncompliance and hist of crack cocaine use. Denies recent use.  Reviewed: Allergy & Precautions, H&P , NPO status , Patient's Chart, lab work & pertinent test results, reviewed documented beta blocker date and time   History of Anesthesia Complications Negative for: history of anesthetic complications  Airway Mallampati: II TM Distance: >3 FB Neck ROM: Full    Dental  (+) Teeth Intact and Dental Advisory Given   Pulmonary Current Smoker,  breath sounds clear to auscultation        Cardiovascular hypertension, Pt. on medications and Pt. on home beta blockers + Valvular Problems/Murmurs Rhythm:Regular Rate:Normal + Systolic murmurs    Neuro/Psych    GI/Hepatic   Endo/Other    Renal/GU Dialysis and ESRFRenal diseaseHD x 6 years - M,W,F     Musculoskeletal   Abdominal (+) + obese,   Peds  Hematology   Anesthesia Other Findings   Reproductive/Obstetrics                         Anesthesia Physical Anesthesia Plan  ASA: III  Anesthesia Plan: General   Post-op Pain Management:    Induction:   Airway Management Planned: Oral ETT  Additional Equipment:   Intra-op Plan:   Post-operative Plan: Extubation in OR  Informed Consent: I have reviewed the patients History and Physical, chart, labs and discussed the procedure including the risks, benefits and alternatives for the proposed anesthesia with the patient or authorized representative who has indicated his/her understanding and acceptance.   Dental advisory given  Plan Discussed with: CRNA  Anesthesia Plan Comments:         Anesthesia Quick Evaluation

## 2012-09-20 NOTE — Anesthesia Procedure Notes (Signed)
Anesthesia Regional Block:  Femoral nerve block  Pre-Anesthetic Checklist: ,, timeout performed, Correct Patient, Correct Site, Correct Laterality, Correct Procedure, Correct Position, site marked, Risks and benefits discussed, at surgeon's request and post-op pain management  Laterality: Right and Upper  Prep: Betadine       Needles:  Injection technique: Single-shot  Needle Type: Stimulator Needle - 80     Needle Length:cm 9 cm Needle Gauge: 22 and 22 G  Needle insertion depth: 6 cm   Additional Needles:  Procedures: nerve stimulator Femoral nerve block  Nerve Stimulator or Paresthesia:  Response: Twitch elicited, 0.8 mA,   Additional Responses:   Narrative:  Start time: 09/20/2012 2:10 PM End time: 09/20/2012 2:28 PM Injection made incrementally with aspirations every 5 mL.  Performed by: Personally  Anesthesiologist: Alma Friendly, MD  Additional Notes: BP cuff, EKG monitors applied. Sedation begun. Femoral artery palpated for location of nerve. After nerve location anesthetic injected incrementally, slowly , and after neg aspirations. Tolerated well.  Femoral nerve block

## 2012-09-20 NOTE — Preoperative (Signed)
Beta Blockers   Reason not to administer Beta Blockers:Not Applicable 

## 2012-09-20 NOTE — Interval H&P Note (Signed)
History and Physical Interval Note:  09/20/2012 2:01 PM  [name] [name]  has presented today for surgery, with the diagnosis of RIGHT KNEE OA  The various methods of treatment have been discussed with the patient and family. After consideration of risks, benefits and other options for treatment, the patient has consented to  Procedure(s) (LRB) with comments: TOTAL KNEE ARTHROPLASTY (Right) as a surgical intervention .  The patient's history has been reviewed, patient examined, no change in status, stable for surgery.  I have reviewed the patient's chart and labs.  Questions were answered to the patient's satisfaction.     [name]

## 2012-09-20 NOTE — Transfer of Care (Signed)
Immediate Anesthesia Transfer of Care Note  Patient: [name] [name]  Procedure(s) Performed: Procedure(s) (LRB) with comments: TOTAL KNEE ARTHROPLASTY (Right) - right total knee arthroplasty  Patient Location: PACU  Anesthesia Type: General  Level of Consciousness: awake, alert  and oriented  Airway & Oxygen Therapy: Patient Spontanous Breathing and Patient connected to nasal cannula oxygen  Post-op Assessment: Report given to PACU RN, Post -op Vital signs reviewed and stable and Patient moving all extremities  Post vital signs: Reviewed and stable  Complications: No apparent anesthesia complications

## 2012-09-20 NOTE — Anesthesia Postprocedure Evaluation (Signed)
Anesthesia Post Note  Patient: [name] [name]  Procedure(s) Performed: Procedure(s) (LRB): TOTAL KNEE ARTHROPLASTY (Right)  Anesthesia type: General  Patient location: PACU  Post pain: Pain level controlled and Adequate analgesia  Post assessment: Post-op Vital signs reviewed, Patient's Cardiovascular Status Stable, Respiratory Function Stable, Patent Airway and Pain level controlled  Last Vitals:  Filed Vitals:   09/20/12 1815  BP: 137/83  Pulse: 63  Temp:   Resp: 14    Post vital signs: Reviewed and stable  Level of consciousness: awake, alert  and oriented  Complications: No apparent anesthesia complications

## 2012-09-20 NOTE — Progress Notes (Signed)
Pt arrived to the unit with dx. Post op right knee replacement. Pt arousable. VS stable. Pt complaining of pain. Dressing dry and intact. Pt irritable does not want to be assessed. Will cont to monitor.

## 2012-09-21 ENCOUNTER — Inpatient Hospital Stay (HOSPITAL_COMMUNITY): Payer: Medicare Other

## 2012-09-21 LAB — CBC
MCV: 87.5 fL (ref 78.0–100.0)
Platelets: 157 10*3/uL (ref 150–400)
RBC: 3.37 MIL/uL — ABNORMAL LOW (ref 4.22–5.81)
WBC: 10.2 10*3/uL (ref 4.0–10.5)

## 2012-09-21 LAB — RENAL FUNCTION PANEL
Albumin: 3.2 g/dL — ABNORMAL LOW (ref 3.5–5.2)
CO2: 21 mEq/L (ref 19–32)
Calcium: 9 mg/dL (ref 8.4–10.5)
Chloride: 95 mEq/L — ABNORMAL LOW (ref 96–112)
Creatinine, Ser: 13.47 mg/dL — ABNORMAL HIGH (ref 0.50–1.35)
GFR calc Af Amer: 4 mL/min — ABNORMAL LOW (ref >90)
GFR calc non Af Amer: 4 mL/min — ABNORMAL LOW (ref >90)
Sodium: 133 mEq/L — ABNORMAL LOW (ref 135–145)

## 2012-09-21 MED ORDER — HYDROMORPHONE HCL PF 1 MG/ML IJ SOLN
INTRAMUSCULAR | Status: AC
  Administered 2012-09-21: 1 mg via INTRAVENOUS
  Filled 2012-09-21: qty 1

## 2012-09-21 MED ORDER — PARICALCITOL 5 MCG/ML IV SOLN
INTRAVENOUS | Status: AC
  Administered 2012-09-21: 7 ug via INTRAVENOUS
  Filled 2012-09-21: qty 2

## 2012-09-21 MED ORDER — PNEUMOCOCCAL VAC POLYVALENT 25 MCG/0.5ML IJ INJ
0.5000 mL | INJECTION | INTRAMUSCULAR | Status: AC
  Filled 2012-09-21: qty 0.5

## 2012-09-21 NOTE — Evaluation (Signed)
Physical Therapy Evaluation Patient Details Name: [name] [name] MRN: [id number] DOB: [birth year] Today's Date: 09/21/2012 Time: 7829-5621 PT Time Calculation (min): 32 min  PT Assessment / Plan / Recommendation Clinical Impression  Patient is a 51 yo male s/p right TKA.  Patient with ESRD on HD.  Patient currently does not have a place to live, and he will need continued therapy and assist at discharge.  Recommend ST-SNF at discharge for continued therapy.  Patient will benefit from acute PT to facilitate transition to SNF therapy.    PT Assessment  Patient needs continued PT services    Follow Up Recommendations  Skilled nursing facility    Barriers to Discharge Decreased caregiver support      Equipment Recommendations  Rolling walker with 5" wheels    Recommendations for Other Services     Frequency 7X/week    Precautions / Restrictions Precautions Precautions: Knee Required Braces or Orthoses: Knee Immobilizer - Right Knee Immobilizer - Right: On at all times;Discontinue once straight leg raise with < 10 degree lag Restrictions Weight Bearing Restrictions: Yes RLE Weight Bearing: Weight bearing as tolerated   Pertinent Vitals/Pain Pain limiting mobility today.      Mobility  Bed Mobility Bed Mobility: Supine to Sit;Sitting - Scoot to Edge of Bed;Sit to Supine Supine to Sit: 4: Min assist;With rails;HOB flat Sitting - Scoot to Edge of Bed: 4: Min guard Sit to Supine: 4: Min assist;HOB flat Scooting to HOB: 4: Min assist;With rail Details for Bed Mobility Assistance: Verbal cues for technique.  Assist required to move RLE on and off bed. Transfers Transfers: Sit to Stand;Stand to Sit Sit to Stand: 4: Min assist;With upper extremity assist;From bed Stand to Sit: 4: Min assist;With upper extremity assist;To bed Details for Transfer Assistance: Verbal cues for hand placement and placement of RLE during transitions.  Verbal cues for WBAT  RLE. Ambulation/Gait Ambulation/Gait Assistance: 4: Min assist Ambulation Distance (Feet): 20 Feet Assistive device: Rolling walker Ambulation/Gait Assistance Details: Verbal cues for safe use of RW and for gait sequence.  Cues to stand upright and look up during gait. Gait Pattern: Step-to pattern;Decreased step length - left;Decreased stance time - right;Antalgic;Trunk flexed Gait velocity: Slow gait speed           PT Diagnosis: Difficulty walking;Acute pain  PT Problem List: Decreased strength;Decreased range of motion;Decreased activity tolerance;Decreased balance;Decreased mobility;Decreased knowledge of use of DME;Decreased knowledge of precautions;Obesity;Pain PT Treatment Interventions: DME instruction;Gait training;Functional mobility training;Therapeutic exercise;Patient/family education   PT Goals Acute Rehab PT Goals PT Goal Formulation: With patient Time For Goal Achievement: 10/05/12 Potential to Achieve Goals: Good Pt will go Supine/Side to Sit: Independently;with HOB 0 degrees PT Goal: Supine/Side to Sit - Progress: Goal set today Pt will go Sit to Supine/Side: Independently;with HOB 0 degrees PT Goal: Sit to Supine/Side - Progress: Goal set today Pt will go Sit to Stand: with modified independence;with upper extremity assist PT Goal: Sit to Stand - Progress: Goal set today Pt will go Stand to Sit: with modified independence;with upper extremity assist PT Goal: Stand to Sit - Progress: Goal set today Pt will Ambulate: >150 feet;with modified independence;with rolling walker PT Goal: Ambulate - Progress: Goal set today Pt will Perform Home Exercise Program: Independently PT Goal: Perform Home Exercise Program - Progress: Goal set today  Visit Information  Last PT Received On: 09/21/12 Assistance Needed: +1    Subjective Data  Subjective: "I'll try to get up" Patient Stated Goal: To walk   Prior Functioning  Home Living Lives With: Alone Available Help at  Discharge: Other (Comment) (Patient reports he is "between homes") Type of Home: Other (Comment) (Unsure where he will live when he leaves) Home Adaptive Equipment: None Prior Function Level of Independence: Independent Able to Take Stairs?: Yes Driving: No Communication Communication: No difficulties    Cognition  Overall Cognitive Status: Appears within functional limits for tasks assessed/performed Arousal/Alertness: Awake/alert Orientation Level: Appears intact for tasks assessed Behavior During Session: Flat affect    Extremity/Trunk Assessment Right Upper Extremity Assessment RUE ROM/Strength/Tone: Within functional levels Left Upper Extremity Assessment LUE ROM/Strength/Tone: Within functional levels Right Lower Extremity Assessment RLE ROM/Strength/Tone: Deficits;Unable to fully assess;Due to pain RLE ROM/Strength/Tone Deficits: Strength 3/5; Difficult to assess  RLE Sensation: WFL - Light Touch Left Lower Extremity Assessment LLE ROM/Strength/Tone: WFL for tasks assessed LLE Sensation: WFL - Light Touch   Balance    End of Session PT - End of Session Equipment Utilized During Treatment: Gait belt;Right knee immobilizer Activity Tolerance: Patient limited by pain;Patient limited by fatigue Patient left: in bed;with call bell/phone within reach Nurse Communication: Mobility status  GP     Vena Austria 09/21/2012, 2:28 PM Durenda Hurt. Renaldo Fiddler, [facility] Acute Rehab Services Pager [phone]

## 2012-09-21 NOTE — Progress Notes (Signed)
Subjective: 1 Day Post-Op Procedure(s) (LRB): TOTAL KNEE ARTHROPLASTY (Right) Patient reports pain as moderate.    Objective: Vital signs in last 24 hours: Temp:  [97.3 F (36.3 C)-98.6 F (37 C)] 98.6 F (37 C) (09/28 1219) Pulse Rate:  [55-78] 68  (09/28 1217) Resp:  [14-22] 16  (09/28 1217) BP: (92-156)/(43-115) 116/66 mmHg (09/28 1217) SpO2:  [92 %-99 %] 92 % (09/28 0615) Weight:  [135 kg (297 lb 9.9 oz)-139.2 kg (306 lb 14.1 oz)] 135 kg (297 lb 9.9 oz) (09/28 1131)  Intake/Output from previous day: 09/27 0701 - 09/28 0700 In: 750 [I.V.:750] Out: 595 [Urine:160; Drains:385; Blood:50] Intake/Output this shift: Total I/O In: -  Out: 5001 [Other:5001]   Basename 09/21/12 0700 09/20/12 1026  HGB 10.0* 13.3    Basename 09/21/12 0700 09/20/12 1026  WBC 10.2 --  RBC 3.37* --  HCT 29.5* 39.0  PLT 157 --    Basename 09/21/12 0646 09/20/12 1026  NA 133* 135  K 5.7* 5.2*  CL 95* --  CO2 21 --  BUN 74* --  CREATININE 13.47* --  GLUCOSE 107* 83  CALCIUM 9.0 --   No results found for this basename: LABPT:2,INR:2 in the last 72 hours  Dressing clean, dry, intact. No change neurovascular status  Assessment/Plan: 1 Day Post-Op Procedure(s) (LRB): TOTAL KNEE ARTHROPLASTY (Right) Up with therapy D/C hemovac  Lucie Friedlander A 09/21/2012, 12:41 PM

## 2012-09-21 NOTE — Op Note (Signed)
NAMESALADIN, PETRELLI NO.:  [id number]  MEDICAL RECORD NO.:  [id number]  LOCATION:  6704                         FACILITY:  [facility]  PHYSICIAN:  Madlyn Frankel. Charlann Boxer, M.D.  DATE OF BIRTH:  February 23, 1961  DATE OF PROCEDURE:  09/20/2012 DATE OF DISCHARGE:                              OPERATIVE REPORT   PREOPERATIVE DIAGNOSIS:  Right knee osteoarthritis.  POSTOPERATIVE DIAGNOSIS: 1. Right knee osteoarthritis. 2. End-stage renal disease requiring hemodialysis.  PROCEDURE:  Right total knee replacement.  COMPONENTS USED:  DePuy knee system, a size 5 MBT revision tray with 5 mm medial and lateral augments.  A size 5 femur with 4 mm distal, medial, and lateral augments, size 12.5 posterior stabilized insert, and a 41 patellar button.  SURGEON:  Madlyn Frankel. Charlann Boxer, M.D.  ASSISTANT:  Lanney Gins, PA-C.  Note that Mr. Carmon Sails was present for the entirety of the case, critical for maintenance and management of the operative extremity, protection of vital soft tissues, general facilitation from a primary wound closure.  ANESTHESIA:  General.  SPECIMENS:  None.  COMPLICATIONS:  None.  FINDINGS:  The patient was noted to have a very large knee effusion, significant osteoarthritis in the medial and patellofemoral joints with complete loss of cartilage, osteophytes present, and eburnated bone.  He was noted preoperatively to have a slight hyperextension and for that reason just did some of my cuts yet still required significant augmentation to provide stable joint.  DRAINS:  One medium Hemovac.  TOURNIQUET TIME:  51 minute at 250 mmHg.  INJECTIONS:  I did inject the synovial capsule junction of the knee with 60 mL of 0.25% Marcaine with epinephrine.  INDICATIONS FOR PROCEDURE:  Mr. Valentine is a 51 year old gentleman who had been seen and evaluated in office for advanced left knee osteoarthritis. Radiographs had revealed significant loss of joint space and osteophytes.   His quality of life was significantly diminished related to his knee pain.  He at this point wished to proceed with knee replacement surgery.  Risks of infection, DVT, component failure, need for future revision surgery were all discussed and reviewed.  Consent was obtained for benefit of pain relief.  PROCEDURE IN DETAIL:  The patient was brought to the operative theater. Once adequate anesthesia and preoperative antibiotics, Ancef administered, he was positioned supine with left thigh tourniquet placed.  The left lower extremity was then prepped and draped in a sterile fashion.  Time-out was performed identifying the patient, planned procedure, and extremity.  Leg was exsanguinated, tourniquet elevated to 250 mmHg.  A midline incision was made followed by median arthrotomy, encountered large effusion and synovitis.  Following exposure of the knee, attention was first directed to the patella, precut measure was 25-26 mm.  I resected down to 14 mm and used a 41 button to restore the patellar height.  The metal Shim was placed on the patella on the patella subluxated laterally.  The femoral canal was opened with a drill, irrigated to try to prevent fat emboli.  An intramedullary rod was passed and at 5 degrees of valgus I did resect only 9 mm of bone off the distal femur again related to this hyperextension.  Following this cut, using extramedullary guide resected 10 mm of bone off the proximal lateral tibia.  When I checked this with the spacer guide it was obvious at this point that the gap was bigger than a 10 mm spacer, but I thought at least we can perhaps get 15 mm insert.  I then checked and confirmed that the cut was perpendicular in the coronal plane using alignment rod and a tibial tray.  At this point, I sized the femur to be a size 5.  The size 5 rotation block was pinned into position using the C-clamp off the proximal tibia. The former cutting block was then pinned in  position.  Anterior, posterior, and chamfer cuts were made without difficulty nor notching.  Final box cut made off the lateral aspect of distal femur.  At this point, the tibia subluxated anteriorly.  I used a size 5 tibial tray, which fit nicely on the cut surface, it was pinned to the medial 3rd of tubercle, drilled and keel punch.  At this point, I did a trial reduction with a 5 femur, the 5 tibia.  When I put 15 insert, he still hyperextended.  I at this point made a clinical decision to use augments in order to stabilize and going up with increased polyethylene.  The trial components were removed.  I drilled further for the tibial component for an MBT revision tray.  We then irrigated the knee with normal saline solution while the final components were open.  On the back table, we configured a size 5 MBT revision tray with 5 mm medial and lateral augments, and I also ordered up 4 mm distal, medial, and lateral augments.  These were configured on the back table and then cement was mixed.  The final components were cemented into position.  I brought the knee out in extension with a 12.5 insert.  With this, I found the knee came to full extension.  The medial and lateral ligaments were nice and taut.  With good stability, the knee was kept in extension until the cement fully cured.  Once the cement had fully cured, excess cement was removed throughout the knee.  The final 12.5 insert was opened and inserted into the knee.  The tourniquet was let down after 51 minutes.  I irrigated the knee with normal saline solution pulse lavage, placed a medium Hemovac drain deep. The extensor mechanism was then reapproximated with the knee in flexion using #1 Vicryl and 0-Vicryl.  The extensor mechanism was closed with #1 Vicryl and 0 V-Loc.  The remaining wound was closed with 2-0 Vicryl and 4-0 running Monocryl.  The knee was cleaned, dried, and dressed sterilely using Dermabond and Aquacel  dressing.  Drain site was dressed separately with a Tegaderm and gauze.  He was then extubated and brought to the recovery room in stable condition tolerating the procedure well. He will remain in the hospital over the weekend.  He will probably be dialyzed tomorrow.  He typically dialyzes on Monday, Wednesday, and Friday.  Most likely we will work on transfer to a nursing facility by Monday or Tuesday for rehab purposes as well as management of his dialysis.     Madlyn Frankel Charlann Boxer, M.D.     MDO/MEDQ  D:  09/20/2012  T:  09/21/2012  Job:  [id number]

## 2012-09-21 NOTE — Progress Notes (Signed)
Subjective:   Seen on HD, mild right knee pain, but comfortable; otherwise, no complaints.  Objective: Vital signs in last 24 hours: Temp:  [97.3 F (36.3 C)-97.9 F (36.6 C)] 97.6 F (36.4 C) (09/27 1846) Pulse Rate:  [62-78] 68  (09/28 0330) Resp:  [14-22] 17  (09/28 0330) BP: (117-172)/(64-115) 117/79 mmHg (09/28 0330) SpO2:  [93 %-99 %] 96 % (09/28 0330) Weight:  [138.6 kg (305 lb 8.9 oz)] 138.6 kg (305 lb 8.9 oz) (09/27 1846) Weight change:   Intake/Output from previous day: 09/27 0701 - 09/28 0700 In: 750 [I.V.:750] Out: 595 [Urine:160; Drains:385; Blood:50] Intake/Output this shift: Total I/O In: -  Out: 300 [Drains:300] EXAM: General appearance:  Alert, in no apparent distress Resp:  CTA without rales, rhonchi, or wheezes Cardio:  RRR without murmur GI: + BS, soft and nontender Extremities:  No edema on left, brace and dressing on right Access: AVF @ LUA with BFR 500 cc/min  Lab Results:  Basename 09/20/12 1026  WBC --  HGB 13.3  HCT 39.0  PLT --   BMET:  Basename 09/20/12 1026  NA 135  K 5.2*  CL --  CO2 --  GLUCOSE 83  BUN --  CREATININE --  CALCIUM --  ALBUMIN --   No results found for this basename: PTH:2 in the last 72 hours Iron Studies: No results found for this basename: IRON,TIBC,TRANSFERRIN,FERRITIN in the last 72 hours  Dialysis Orders: Center: [facility] on MWF 200 [address]iflux  EDW 133- never gets to EDW; signs off early and misses HD therefore volumes are huge HD Bath 2K 2 Ca Time 4.5 Heparin 9000 to start and 4000 mid treatment. Access left upper AVF BFR 500 DFR 800  Zemplar 7 mcg IV/HD Epogen none Venofer 50/week Fri.  Assessment/Plan: 1. Right total knee replacement - secondary to osteoarthritis, yesterday per Dr. Ned Clines; no Heparin with HD; rehab at [facility] pending. 2. ESRD - HD on MWF @ [facility]; K 5.2 yesterday. 3. HTN/ Volume - BP 138/80 most recently on HD, on Norvasc 10 mg qd and Labetalol 300 mg bid, usually noncompliant as outpatient; wt  138.6 kg with EDW 133 kg, usually does not reach DW secondary to noncompliance with HD.  UF goal of 5 L today. 4. Anemia - Hgb 13.3 pre-surgery with no Epogen.  CBC pending. 5. Metabolic bone disease - On Zemplar 7 mcg, Phoslo with meals, refuses Sensipar (last iPTH 1853).  Renal panel pending. 6. Nutrition - High protein diet. 7. Medical and dialysis noncompliance - attended 7 of 13 treatments in August with average time of 228 of 270 prescribed minutes and average post-HD wt of 135.6 kg. 8. Tobacco and cocaine abuse - encourage abstinence.    LOS: 1 day   [name],[name] 09/21/2012,6:46 AM  Patient seen and examined and agree with assessment and plan as above.  Vinson Moselle  MD [facility] [phone] pgr    [phone] cell 09/21/2012, 12:52 PM

## 2012-09-22 LAB — CBC
MCH: 28.9 pg (ref 26.0–34.0)
MCHC: 32.7 g/dL (ref 30.0–36.0)
Platelets: 148 10*3/uL — ABNORMAL LOW (ref 150–400)
RBC: 3.57 MIL/uL — ABNORMAL LOW (ref 4.22–5.81)

## 2012-09-22 LAB — BASIC METABOLIC PANEL
Calcium: 9.3 mg/dL (ref 8.4–10.5)
GFR calc non Af Amer: 6 mL/min — ABNORMAL LOW (ref >90)
Potassium: 4.6 mEq/L (ref 3.5–5.1)
Sodium: 134 mEq/L — ABNORMAL LOW (ref 135–145)

## 2012-09-22 NOTE — Progress Notes (Signed)
Physical Therapy Treatment Patient Details Name: [name] [name] MRN: [id number] DOB: 11-14-1961 Today's Date: 09/22/2012 Time: 4098-1191 PT Time Calculation (min): 25 min  PT Assessment / Plan / Recommendation Comments on Treatment Session  Patient did well with mobility and gait today.  Pain remains limiting factor    Follow Up Recommendations  Skilled nursing facility    Barriers to Discharge        Equipment Recommendations  Rolling walker with 5" wheels    Recommendations for Other Services    Frequency 7X/week   Plan Discharge plan remains appropriate;Frequency remains appropriate    Precautions / Restrictions Precautions Precautions: Knee Required Braces or Orthoses: Knee Immobilizer - Right Knee Immobilizer - Right: On at all times;Discontinue once straight leg raise with < 10 degree lag Restrictions Weight Bearing Restrictions: No RLE Weight Bearing: Weight bearing as tolerated   Pertinent Vitals/Pain Pain remains limiting factor.    Mobility  Bed Mobility Bed Mobility: Supine to Sit;Sitting - Scoot to Edge of Bed Supine to Sit: 4: Min assist;With rails;HOB flat Sitting - Scoot to Edge of Bed: 4: Min guard Details for Bed Mobility Assistance: Verbal cues for technique.  Assist to move RLE off bed. Transfers Transfers: Sit to Stand;Stand to Sit Sit to Stand: 4: Min assist;With upper extremity assist;From bed Stand to Sit: 4: Min assist;With upper extremity assist;With armrests;To chair/3-in-1 Details for Transfer Assistance: Verbal cues for hand placement and placement of RLE during transfers. Ambulation/Gait Ambulation/Gait Assistance: 4: Min guard Ambulation Distance (Feet): 102 Feet Assistive device: Rolling walker Ambulation/Gait Assistance Details: Verbal cues to stand upright during gait.  Patient using RW safely Gait Pattern: Step-to pattern;Decreased step length - left;Decreased stance time - right;Antalgic;Trunk flexed Gait velocity: Slow gait speed      Exercises Total Joint Exercises Ankle Circles/Pumps: AROM;Both;10 reps;Seated Quad Sets: AROM;Right;10 reps;Seated Heel Slides: AAROM;Right;10 reps;Seated Hip ABduction/ADduction: AAROM;Right;10 reps;Seated    PT Goals Acute Rehab PT Goals PT Goal: Supine/Side to Sit - Progress: Progressing toward goal PT Goal: Sit to Supine/Side - Progress: Progressing toward goal PT Goal: Sit to Stand - Progress: Progressing toward goal PT Goal: Stand to Sit - Progress: Progressing toward goal PT Goal: Ambulate - Progress: Progressing toward goal PT Goal: Perform Home Exercise Program - Progress: Progressing toward goal  Visit Information  Last PT Received On: 09/22/12 Assistance Needed: +1    Subjective Data  Subjective: Patient reports he has been up to bathroom this am.   Cognition  Overall Cognitive Status: Appears within functional limits for tasks assessed/performed Arousal/Alertness: Awake/alert Orientation Level: Appears intact for tasks assessed Behavior During Session: [facility] [place] for tasks performed    Balance     End of Session PT - End of Session Equipment Utilized During Treatment: Gait belt;Right knee immobilizer Activity Tolerance: Patient limited by pain;Patient limited by fatigue Patient left: in chair;with call bell/phone within reach Nurse Communication: Mobility status   GP     Vena Austria 09/22/2012, 5:15 PM Durenda Hurt. Renaldo Fiddler, [facility] Acute Rehab Services Pager [phone]

## 2012-09-22 NOTE — Progress Notes (Signed)
09/22/2012 1520 Received call from [facility] and they are going to follow for [facility] PT. NCM contacted pt and states he plans on going to SNF. He does need RW for home. RW will be deferred to SNF if d/c to SNF. Will order if he decides to go home. Isidoro Donning RN CCM Case Mgmt phone [phone]

## 2012-09-22 NOTE — Progress Notes (Signed)
Subjective:  No current complaints, comfortable.  Objective: Vital signs in last 24 hours: Temp:  [98.4 F (36.9 C)-99.1 F (37.3 C)] 98.5 F (36.9 C) (09/29 0523) Pulse Rate:  [55-79] 79  (09/29 0523) Resp:  [16-19] 18  (09/29 0523) BP: (92-122)/(43-69) 122/69 mmHg (09/29 0523) SpO2:  [85 %-96 %] 85 % (09/29 0523) Weight:  [135 kg (297 lb 9.9 oz)] 135 kg (297 lb 9.9 oz) (09/28 1131) Weight change: -3.6 kg (-7 lb 15 oz)  Intake/Output from previous day: 09/28 0701 - 09/29 0700 In: 220 [P.O.:220] Out: 5251 [Urine:250]   EXAM: General appearance:  Alert, in no apparent distress Resp:  CTA without rales, rhonchi, or wheezes Cardio:  RRR with Gr II/VI systolic murmur GI:  + BS, soft and nontender Extremities:  No edema, brace and dressing on right leg Access:  AVF @ LUA with + bruit  Lab Results:  Basename 09/22/12 0630 09/21/12 0700  WBC 10.7* 10.2  HGB 10.3* 10.0*  HCT 31.5* 29.5*  PLT 148* 157   BMET:  Basename 09/22/12 0630 09/21/12 0646  NA 134* 133*  K 4.6 5.7*  CL 94* 95*  CO2 23 21  GLUCOSE 99 107*  BUN 39* 74*  CREATININE 9.16* 13.47*  CALCIUM 9.3 9.0  ALBUMIN -- 3.2*   No results found for this basename: PTH:2 in the last 72 hours Iron Studies: No results found for this basename: IRON,TIBC,TRANSFERRIN,FERRITIN in the last 72 hours  Dialysis Orders: Center: [facility] on MWF 200 [address]lux  EDW 133- never gets to EDW; signs off early and misses HD therefore volumes are huge HD Bath 2K 2 Ca Time 4.5 Heparin 9000 to start and 4000 mid treatment. Access left upper AVF BFR 500 DFR 800  Zemplar 7 mcg IV/HD Epogen none Venofer 50/week Fri.  Assessment/Plan: 1. Right total knee replacement - secondary to osteoarthritis, 9/27 per Dr. Charlann Boxer; no Heparin with HD; rehab at [facility] pending. 2. ESRD - HD on MWF @ [facility]; K 4.6 today. 3. HTN/ Volume - BP 122/69 most recently, on Norvasc 10 mg qd and Labetalol 300 mg bid, usually noncompliant as outpatient; wt 135 kg s/p net UF 5 L  yesterday with EDW 133 kg, usually does not reach DW secondary to noncompliance with HD. 4. Anemia - Hgb 10.3, 13.3 pre-surgery, no outpatient Epogen. Follow trend, may start Aranesp on Tues. 5. Metabolic bone disease - Ca 9.3 (9.9 corrected), P 7.3, on Zemplar 7 mcg, Phoslo with meals, refuses Sensipar (last iPTH 1853). 6. Nutrition - Alb 3.2, high protein diet. 7. Medical and dialysis noncompliance - attended 7 of 13 treatments in August with average time of 228 of 270 prescribed minutes and average post-HD wt of 135.6 kg. 8. Tobacco and cocaine abuse - encourage abstinence.     LOS: 2 days   [name],[name] 09/22/2012,8:05 AM  Patient seen and examined and agree with assessment and plan as above.  Vinson Moselle  MD [facility] [phone] pgr    [phone] cell 09/22/2012, 11:02 AM

## 2012-09-22 NOTE — Progress Notes (Signed)
OT PROGRESS NOTE  OT order received. Chart reviewed. Pt plans to go to SNF for rehab. Will defer OT to SNF. [facility], OTR/L  [phone] 09/22/2012

## 2012-09-22 NOTE — Progress Notes (Signed)
Subjective: 2 Days Post-Op Procedure(s) (LRB): TOTAL KNEE ARTHROPLASTY (Right) Patient reports pain as mild.    Objective: Vital signs in last 24 hours: Temp:  [98.3 F (36.8 C)-99.1 F (37.3 C)] 98.3 F (36.8 C) (09/29 1023) Pulse Rate:  [67-79] 79  (09/29 1023) Resp:  [18-19] 18  (09/29 1023) BP: (92-123)/(52-70) 123/70 mmHg (09/29 1023) SpO2:  [85 %-96 %] 92 % (09/29 1023)  Intake/Output from previous day: 09/28 0701 - 09/29 0700 In: 220 [P.O.:220] Out: 5251 [Urine:250] Intake/Output this shift: Total I/O In: 240 [P.O.:240] Out: -    Basename 09/22/12 0630 09/21/12 0700 09/20/12 1026  HGB 10.3* 10.0* 13.3    Basename 09/22/12 0630 09/21/12 0700  WBC 10.7* 10.2  RBC 3.57* 3.37*  HCT 31.5* 29.5*  PLT 148* 157    Basename 09/22/12 0630 09/21/12 0646  NA 134* 133*  K 4.6 5.7*  CL 94* 95*  CO2 23 21  BUN 39* 74*  CREATININE 9.16* 13.47*  GLUCOSE 99 107*  CALCIUM 9.3 9.0   No results found for this basename: LABPT:2,INR:2 in the last 72 hours  Dressing clean, dry, intact. No change neurovascular status   Assessment/Plan: 2 Days Post-Op Procedure(s) (LRB): TOTAL KNEE ARTHROPLASTY (Right) Up with therapy  Loda Bialas A 09/22/2012, 12:30 PM

## 2012-09-22 NOTE — Progress Notes (Signed)
Clinical Social Work Department CLINICAL SOCIAL WORK PLACEMENT NOTE 09/22/2012  Patient:  [name] [name], [name] [name]  Account Number:  [id number] Admit date:  09/20/2012  Clinical Social Worker: Lia Foyer LCSWA ,  Date/time:  09/22/2012 04:27 PM  Clinical Social Work is seeking post-discharge placement for this patient at the following level of care:   SKILLED NURSING   (*CSW will update this form in Epic as items are completed)   09/22/2012  Patient/family provided with [facility] Department of Clinical Social Work's list of facilities offering this level of care within the geographic area requested by the patient (or if unable, by the patient's family).  09/22/2012  Patient/family informed of their freedom to choose among providers that offer the needed level of care, that participate in Medicare, Medicaid or managed care program needed by the patient, have an available bed and are willing to accept the patient.  09/22/2012  Patient/family informed of [facility]' ownership interest in [facility], as well as of the fact that they are under no obligation to receive care at this facility.  PASARR submitted to EDS on 09/22/2012 PASARR number received from EDS on 09/22/2012  FL2 transmitted to all facilities in geographic area requested by pt/family on  09/22/2012 FL2 transmitted to all facilities within larger geographic area on   Patient informed that his/her managed care company has contracts with or will negotiate with  certain facilities, including the following:     Patient/family informed of bed offers received:   Patient chooses bed at  Physician recommends and patient chooses bed at    Patient to be transferred to  on   Patient to be transferred to facility by   The following physician request were entered in Epic:   Additional Comments: [facility] #1 choice  Lia Foyer, LCSWA [facility] Clinical Social Worker Contact #:  [phone] (weekend)

## 2012-09-23 ENCOUNTER — Inpatient Hospital Stay (HOSPITAL_COMMUNITY): Payer: Medicare Other

## 2012-09-23 ENCOUNTER — Encounter (HOSPITAL_COMMUNITY): Payer: Self-pay | Admitting: Orthopedic Surgery

## 2012-09-23 LAB — RENAL FUNCTION PANEL
CO2: 23 mEq/L (ref 19–32)
Calcium: 9.3 mg/dL (ref 8.4–10.5)
Chloride: 91 mEq/L — ABNORMAL LOW (ref 96–112)
GFR calc Af Amer: 5 mL/min — ABNORMAL LOW (ref >90)
GFR calc non Af Amer: 4 mL/min — ABNORMAL LOW (ref >90)
Glucose, Bld: 92 mg/dL (ref 70–99)
Sodium: 130 mEq/L — ABNORMAL LOW (ref 135–145)

## 2012-09-23 LAB — CBC
Hemoglobin: 9.7 g/dL — ABNORMAL LOW (ref 13.0–17.0)
MCH: 29.4 pg (ref 26.0–34.0)
MCV: 86.4 fL (ref 78.0–100.0)
RBC: 3.3 MIL/uL — ABNORMAL LOW (ref 4.22–5.81)
WBC: 10 10*3/uL (ref 4.0–10.5)

## 2012-09-23 MED ORDER — METHOCARBAMOL 500 MG PO TABS: 500.0000 mg | ORAL_TABLET | Freq: Four times a day (QID) | ORAL | Status: DC | PRN

## 2012-09-23 MED ORDER — PARICALCITOL 5 MCG/ML IV SOLN
INTRAVENOUS | Status: AC
  Administered 2012-09-23: 7 ug via INTRAVENOUS
  Filled 2012-09-23: qty 2

## 2012-09-23 MED ORDER — DIPHENHYDRAMINE HCL 25 MG PO CAPS: 25.0000 mg | ORAL_CAPSULE | Freq: Four times a day (QID) | ORAL | Status: DC | PRN

## 2012-09-23 MED ORDER — DSS 100 MG PO CAPS: 100.0000 mg | ORAL_CAPSULE | Freq: Two times a day (BID) | ORAL | Status: DC

## 2012-09-23 MED ORDER — INFLUENZA VIRUS VACC SPLIT PF IM SUSP
0.5000 mL | Freq: Once | INTRAMUSCULAR | Status: AC
  Administered 2012-09-23: 0.5 mL via INTRAMUSCULAR
  Filled 2012-09-23: qty 0.5

## 2012-09-23 MED ORDER — ENOXAPARIN SODIUM 40 MG/0.4ML ~~LOC~~ SOLN: 40.0000 mg | SUBCUTANEOUS | Status: DC

## 2012-09-23 MED ORDER — FERROUS SULFATE 325 (65 FE) MG PO TABS: 325.0000 mg | ORAL_TABLET | Freq: Three times a day (TID) | ORAL | Status: DC

## 2012-09-23 MED ORDER — POLYETHYLENE GLYCOL 3350 17 G PO PACK: 17.0000 g | PACK | Freq: Two times a day (BID) | ORAL | Status: DC

## 2012-09-23 MED ORDER — HYDROCODONE-ACETAMINOPHEN 7.5-325 MG PO TABS: 1.0000 | ORAL_TABLET | ORAL | Status: DC | PRN

## 2012-09-23 NOTE — Progress Notes (Signed)
Subjective:  No complaints, feeling well. Sitting up in chair, watching TV  Objective: Vital signs in last 24 hours: Temp:  [97.7 F (36.5 C)-99 F (37.2 C)] 97.7 F (36.5 C) (09/30 0544) Pulse Rate:  [72-79] 74  (09/30 0544) Resp:  [18-20] 18  (09/30 0544) BP: (98-138)/(59-73) 138/73 mmHg (09/30 0544) SpO2:  [90 %-96 %] 90 % (09/30 0544) Weight change:   Intake/Output from previous day: 09/29 0701 - 09/30 0700 In: 700 [P.O.:700] Out: -    EXAM: General appearance:  Alert, in no apparent distress Resp:  CTA without rales, rhonchi, or wheezes Cardio:  RRR with Gr II/VI systolic murmur, no rub GI:  + BS, soft and nontemder Extremities:  Swelling at right knee, no edema on left, dressing clean Access:  AVF @ LUA with + bruit  Lab Results:  Basename 09/22/12 0630 09/21/12 0700  WBC 10.7* 10.2  HGB 10.3* 10.0*  HCT 31.5* 29.5*  PLT 148* 157   BMET:  Basename 09/22/12 0630 09/21/12 0646  NA 134* 133*  K 4.6 5.7*  CL 94* 95*  CO2 23 21  GLUCOSE 99 107*  BUN 39* 74*  CREATININE 9.16* 13.47*  CALCIUM 9.3 9.0  ALBUMIN -- 3.2*   No results found for this basename: PTH:2 in the last 72 hours Iron Studies: No results found for this basename: IRON,TIBC,TRANSFERRIN,FERRITIN in the last 72 hours  Dialysis Orders: Center: [facility] on MWF 200 Optiflux  EDW 133- never gets to EDW; signs off early and misses HD therefore volumes are huge HD Bath 2K 2 Ca Time 4.5 Heparin 9000 to start and 4000 mid treatment. Access left upper AVF BFR 500 DFR 800  Zemplar 7 mcg IV/HD Epogen none Venofer 50/week Fri.  Assessment/Plan:  1. Right total knee replacement - secondary to osteoarthritis, 9/27 per Dr. Charlann Boxer; no Heparin with HD; rehab at [facility] pending. 2. ESRD - HD on MWF @ [facility]; K 5.7 yesterday.  HD today, renal panel pending. 3. HTN/ Volume - BP 138/73 most recently, on Norvasc 10 mg qd and Labetalol 300 mg bid, usually noncompliant as outpatient; wt 135 kg s/p net UF 5 L yesterday with EDW 133  kg, usually does not reach DW secondary to noncompliance with HD. 4. Anemia - Hgb 10.3, 13.3 pre-surgery, no outpatient Epogen. CBC today, may start Aranesp. 5. Metabolic bone disease - Ca 9.3 (9.9 corrected), P 7.3, on Zemplar 7 mcg, Phoslo with meals, refuses Sensipar (last iPTH 1853). 6. Nutrition - Alb 3.2, high protein diet. 7. Medical and dialysis noncompliance - attended 7 of 13 treatments in August with average time of 228 of 270 prescribed minutes and average post-HD wt of 135.6 kg. 8. Tobacco and cocaine abuse - encourage abstinence.   LOS: 3 days   [name],[name] 09/23/2012,8:29 AM  I spoke with and examined [name] [name].  He is recovering from R TKR by Dr. Charlann Boxer.. Dialysis scheduled for today

## 2012-09-23 NOTE — Progress Notes (Signed)
Patient ID: [name] [name], male   DOB: [birth year], 51 y.o.   MRN: [id number] Subjective: 3 Days Post-Op Procedure(s) (LRB): TOTAL KNEE ARTHROPLASTY (Right)    Patient reports pain as moderate.  No events.  Questions when he will regain full function of his right leg - discussed  Objective:   VITALS:   Filed Vitals:   09/23/12 0900  BP: 153/73  Pulse: 80  Temp: 98.3 F (36.8 C)  Resp: 18    Neurovascular intact Incision: dressing C/D/I Ace wrap removed  LABS  Basename 09/22/12 0630 09/21/12 0700 09/20/12 1026  HGB 10.3* 10.0* 13.3  HCT 31.5* 29.5* 39.0  WBC 10.7* 10.2 --  PLT 148* 157 --     Basename 09/22/12 0630 09/21/12 0646 09/20/12 1026  NA 134* 133* 135  K 4.6 5.7* 5.2*  BUN 39* 74* --  CREATININE 9.16* 13.47* --  GLUCOSE 99 107* 83    No results found for this basename: LABPT:2,INR:2 in the last 72 hours   Assessment/Plan: 3 Days Post-Op Procedure(s) (LRB): TOTAL KNEE ARTHROPLASTY (Right)   Up with therapy Discharge to SNF when arrangements made Stable for discharge Orthopaedically

## 2012-09-23 NOTE — Progress Notes (Signed)
UR COMPLETED  

## 2012-09-23 NOTE — Progress Notes (Signed)
Physical Therapy Treatment Patient Details Name: [name] MRN: [id number] DOB: 11-16-1961 Today's Date: 09/23/2012 Time: 4098-1191 PT Time Calculation (min): 34 min  PT Assessment / Plan / Recommendation Comments on Treatment Session  51 y.o. male s/p TKA POD #3 who is progressing well with gait and mobility.  He is not yet able to lift his leg on his own, so we will continue to use his R KI.      Follow Up Recommendations  Other (comment) (Skilled Nursing Facility)    Barriers to Discharge  none      Equipment Recommendations  Rolling walker with 5" wheels    Recommendations for Other Services  none  Frequency 7X/week   Plan Discharge plan remains appropriate;Frequency remains appropriate    Precautions / Restrictions Precautions Precautions: Knee Required Braces or Orthoses: Knee Immobilizer - Right Knee Immobilizer - Right: On at all times;Discontinue once straight leg raise with < 10 degree lag Restrictions Weight Bearing Restrictions: No RLE Weight Bearing: Weight bearing as tolerated   Pertinent Vitals/Pain Reports knee pain, but refused to rate on pain scale, asked RN for pain meds via call bell.      Mobility  Bed Mobility Bed Mobility: Supine to Sit;Sitting - Scoot to Edge of Bed Supine to Sit: 6: Modified independent (Device/Increase time);With rails;HOB elevated Sitting - Scoot to Edge of Bed: 6: Modified independent (Device/Increase time);With rail Details for Bed Mobility Assistance: relied heavily on rail to get to EOB Transfers Sit to Stand: 5: Supervision;With upper extremity assist;From bed Stand to Sit: 4: Min assist;With upper extremity assist;To chair/3-in-1;With armrests Details for Transfer Assistance: supervision to stand with verbal cues for safe hand placment, min assist to help with right leg to sit.  Ambulation/Gait Ambulation/Gait Assistance: 5: Supervision Ambulation Distance (Feet): 120 Feet Assistive device: Rolling walker Gait  Pattern: Step-to pattern;Trunk flexed;Antalgic Gait velocity: less than 1.8 ft/sec which puts him at risk for recurrent falls.      Exercises Total Joint Exercises Ankle Circles/Pumps: AROM;Both;20 reps;Supine Quad Sets: AROM;Right;10 reps Towel Squeeze: AROM;Both;10 reps;Supine Short Arc Quad: AAROM;Right;10 reps;Supine Hip ABduction/ADduction: AROM;Right;10 reps;Supine Straight Leg Raises: AAROM;Right;10 reps;Supine   PT Goals Acute Rehab PT Goals PT Goal: Supine/Side to Sit - Progress: Progressing toward goal PT Goal: Sit to Supine/Side - Progress: Progressing toward goal PT Goal: Sit to Stand - Progress: Progressing toward goal PT Goal: Stand to Sit - Progress: Progressing toward goal PT Goal: Ambulate - Progress: Progressing toward goal PT Goal: Perform Home Exercise Program - Progress: Progressing toward goal  Visit Information  Last PT Received On: 09/23/12 Assistance Needed: +1    Subjective Data  Subjective: Pt reports that he wants to make it to the end of the hall today         End of Session PT - End of Session Equipment Utilized During Treatment: Right knee immobilizer Activity Tolerance: Patient limited by pain Patient left: in chair;with call bell/phone within reach;with nursing in room Interior and spatial designer in room)      Lurena Joiner B. Kinneth Fujiwara, PT, DPT [phone]   09/23/2012, 10:43 AM

## 2012-09-23 NOTE — Clinical Social Work Note (Signed)
Patient medically stable for discharge and SNF choice is [facility]. Call made to admissions director Donne Hazel regarding d/c today. CSW advised that pre-admission paperwork has been completed for patient, however a bed is not available today. CSW advised that patient can d/c Tuesday, 10/1. MD advised.  Genelle Bal, MSW, LCSW [phone]

## 2012-09-23 NOTE — Discharge Summary (Signed)
Physician Discharge Summary  Patient ID: [name] [name] MRN: [id number] DOB/AGE: 52-Jan-1962 52 y.o.  Admit date: 09/20/2012 Discharge date:  09/24/2012  Procedures:  Procedure(s) (LRB): TOTAL KNEE ARTHROPLASTY (Right)  Attending Physician:  Dr. Durene Romans   Admission Diagnoses:   Right knee OA and pain   Discharge Diagnoses:  Principal Problem:  *S/P right TKA HTN  ESRD  HPI: Pt is a 51 y.o. male complaining of right knee pain for years. Pain had continually increased since the beginning. X-rays in the clinic show end-stage arthritic changes of the right knee. Pt has tried various conservative treatments which have failed to alleviate their symptoms, including steroid injections and NSAIDs. Various options are discussed with the patient. Risks, benefits and expectations were discussed with the patient. Patient understand the risks, benefits and expectations and wishes to proceed with surgery.   PCP: Willis Modena, NP   Discharged Condition: good  Hospital Course:  Patient underwent the above stated procedure on 09/20/2012. Patient tolerated the procedure well and brought to the recovery room in good condition and subsequently to the floor.  POD #1 BP: 116/66 ; Pulse: 68 ; Temp: 98.6 F (37 C) ; Resp: 16  Pt's foley was removed, as well as the hemovac drain removed. IV was changed to a saline lock. Patient reports pain as moderate. Up with therapy. Dressing clean, dry, intact. No change neurovascular status.  LABS  Basename  09/21/12 0700   HGB  10.0  HCT  29.5   POD #2  BP: 123/70 ; Pulse: 79 ; Temp: 98.3 F (36.8 C) ; Resp: 18  Patient reports pain as mild. Up with therapy. Dressing clean, dry, intact. No change neurovascular status.  LABS  Basename  09/22/12 0630   HGB  10.3  HCT  31.5   POD #3  BP: 153/73 ; Pulse: 80 ; Temp: 98.3 F (36.8 C) ; Resp: 18  Patient reports pain as moderate. No events. Questions when he will regain full function of his right  leg, which is discussed with Dr. Charlann Boxer. Up with therapy.  Neurovascular intact, Incision: dressing C/D/I, ACE  wrap removed.  LABS  Basename  09/23/12 1710   HGB  9.7  HCT  28.5   POD #4  BP: 153/73 ; Pulse: 80 ; Temp: 98.3 F (36.8 C) ; Resp: 18 Patient reports pain as moderate. No events. Waiting for insurance SNF approval for discharge, dialyzed yesterday. Discharge to SNF today. Up with therapy, neurovascular intact and incision: dressing C/D/I.  LABS   No new labs   Discharge Exam: General appearance: alert, cooperative and no distress Extremities: Homans sign is negative, no sign of DVT, no edema, redness or tenderness in the calves or thighs and no ulcers, gangrene or trophic changes  Disposition: SNF with follow up in 2 weeks   Follow-up Information    Follow up with [name], MD. Schedule an appointment as soon as possible for a visit in 2 weeks.   Contact information:   [facility] [address] [place] [address] [phone]          Discharge Orders    Future Orders Please Complete By Expires   Diet - low sodium heart healthy      Call MD / Call 911      Comments:   If you experience chest pain or shortness of breath, CALL 911 and be transported to the hospital emergency room.  If you develope a fever above 101  F, pus (white drainage) or increased drainage or redness at the wound, or calf pain, call your surgeon's office.   Discharge instructions      Comments:   Maintain surgical dressing for 8 days, then replace with gauze and tape. Keep the area dry and clean until follow up. Follow up in 2 weeks at [facility]. Call with any questions or concerns.   Constipation Prevention      Comments:   Drink plenty of fluids.  Prune juice may be helpful.  You may use a stool softener, such as Colace (over the counter) 100 mg twice a day.  Use MiraLax (over the counter) for constipation as needed.   Increase activity  slowly as tolerated      Driving restrictions      Comments:   No driving for 4 weeks   TED hose      Comments:   Use stockings (TED hose) for 2 weeks on both leg(s).  You may remove them at night for sleeping.   Change dressing      Comments:   Maintain surgical dressing for 8 days, then change the dressing daily with sterile 4 x 4 inch gauze dressing and tape. Keep the area dry and clean.      Current Discharge Medication List    START taking these medications   Details  diphenhydrAMINE (BENADRYL) 25 mg capsule Take 1 capsule (25 mg total) by mouth every 6 (six) hours as needed for itching, allergies or sleep. Qty: 30 capsule    docusate sodium 100 MG CAPS Take 100 mg by mouth 2 (two) times daily. Qty: 10 capsule    enoxaparin (LOVENOX) 40 MG/0.4ML injection Inject 0.4 mLs (40 mg total) into the skin daily. Qty: 14 Syringe, Refills: 0    HYDROcodone-acetaminophen (NORCO) 7.5-325 MG per tablet Take 1-2 tablets by mouth every 4 (four) hours as needed for pain. Qty: 120 tablet, Refills: 0    methocarbamol (ROBAXIN) 500 MG tablet Take 1 tablet (500 mg total) by mouth every 6 (six) hours as needed (muscle spasms). Qty: 50 tablet, Refills: 0    polyethylene glycol (MIRALAX / GLYCOLAX) packet Take 17 g by mouth 2 (two) times daily. Qty: 14 each      CONTINUE these medications which have NOT CHANGED   Details  amLODipine (NORVASC) 10 MG tablet Take 10 mg by mouth daily.    !! calcium acetate (PHOSLO) 667 MG capsule Take 2,668 mg by mouth 3 (three) times daily with meals.    !! calcium acetate (PHOSLO) 667 MG capsule Take 1,334 mg by mouth 2 (two) times daily.    labetalol (NORMODYNE) 300 MG tablet Take 300 mg by mouth 2 (two) times daily.    cinacalcet (SENSIPAR) 60 MG tablet Take 60 mg by mouth daily.    gabapentin (NEURONTIN) 100 MG capsule Take 100 mg by mouth at bedtime.     !! - Potential duplicate medications found. Please discuss with provider.        Signed: Anastasio Auerbach. Elnore Cosens   PAC  09/23/2012, 12:30 PM

## 2012-09-23 NOTE — Progress Notes (Signed)
Clinical Social Work Department BRIEF PSYCHOSOCIAL ASSESSMENT 09/22/2012  Patient:  [name] [name], [name] [name]     Account Number:  [id number]     Admit date:  09/20/2012  Clinical Social Worker: Lia Foyer LCSWA ,  Date/Time:  09/22/2012 04:29 PM  Referred by:  RN  Date Referred:  09/21/2012 Referred for  SNF Placement   Other Referral:   Interview type:  Patient Other interview type:    PSYCHOSOCIAL DATA Living Status:  FRIEND(S) Primary support name:  Denied person of support in life Primary support relationship to patient:   Degree of support available:   N/A    CURRENT CONCERNS Current Concerns  Post-Acute Placement   Other Concerns:    SOCIAL WORK ASSESSMENT / PLAN CSW was consulted by RN re: SNF. Patient appeared agitated when CSW discussed placement because he thought his doctor had facilitated his placement prior to surgery. CSW was able to de-escalate patient and discuss the SNF process in the hospital. CSW will start a bed search, and the patient would like [facility] to be his number one option.   Assessment/plan status:  Information/Referral to [facility]  PATIENT'S/FAMILY'S RESPONSE TO PLAN OF CARE: Patient was thankful for facilitating placement process.   Lia Foyer, LCSWA [facility] Clinical Social Worker Contact #: [phone] (weekend)

## 2012-09-24 MED ORDER — DARBEPOETIN ALFA-POLYSORBATE 200 MCG/0.4ML IJ SOLN: 200.0000 ug | INTRAMUSCULAR | Status: DC

## 2012-09-24 NOTE — Clinical Social Work Note (Signed)
Patient medically stable and bed is available at [facility] today. Discharge paperwork forwarded to facility on 9/30. CSW facilitated transport to facility via ambulance.  Genelle Bal, MSW, LCSW [phone]

## 2012-09-24 NOTE — Progress Notes (Signed)
Patient ID: [name] [name], male   DOB: Jan 13, 1961, 51 y.o.   MRN: [id number] Subjective: 4 Days Post-Op Procedure(s) (LRB): TOTAL KNEE ARTHROPLASTY (Right)    Patient reports pain as moderate. No events.  Waiting for insurance SNF approval for discharge, dialyzed yesterday  Objective:   VITALS:   Filed Vitals:   09/24/12 0747  BP:   Pulse:   Temp:   Resp: 14    Neurovascular intact Incision: dressing C/D/I  LABS  Basename 09/23/12 1710 09/22/12 0630  HGB 9.7* 10.3*  HCT 28.5* 31.5*  WBC 10.0 10.7*  PLT 152 148*     Basename 09/23/12 1710 09/22/12 0630  NA 130* 134*  K 4.8 4.6  BUN 60* 39*  CREATININE 11.93* 9.16*  GLUCOSE 92 99    No results found for this basename: LABPT:2,INR:2 in the last 72 hours   Assessment/Plan: 4 Days Post-Op Procedure(s) (LRB): TOTAL KNEE ARTHROPLASTY (Right)   Up with therapy Discharge to SNF today RTC 2 weeks Maintain dressing for 2 weeks May shower as dressing is water proof

## 2012-09-24 NOTE — Progress Notes (Signed)
Subjective:   No complaints, anticipates DC to [facility] soon.  Objective: Vital signs in last 24 hours: Temp:  [97.5 F (36.4 C)-98.9 F (37.2 C)] 98.6 F (37 C) (10/01 0556) Pulse Rate:  [53-86] 78  (10/01 0556) Resp:  [14-19] 14  (10/01 0747) BP: (117-147)/(52-88) 122/76 mmHg (10/01 0556) SpO2:  [91 %-95 %] 95 % (10/01 0556) Weight:  [133.3 kg (293 lb 14 oz)-136.3 kg (300 lb 7.8 oz)] 133.3 kg (293 lb 14 oz) (09/30 2141) Weight change:   Intake/Output from previous day: 09/30 0701 - 10/01 0700 In: 480 [P.O.:480] Out: 2307    EXAM: General appearance:  Alert, in no apparent distress Resp:  CTA without rales, rhonchi, or wheezes Cardio:  RRR with Gr II/VI systolic murmur GI:  + BS, soft and nontender Extremities:  Swelling at right knee, bandage over surgical wound, no edema on left Access: AVF @ LUA with + bruit  Lab Results:  Basename 09/23/12 1710 09/22/12 0630  WBC 10.0 10.7*  HGB 9.7* 10.3*  HCT 28.5* 31.5*  PLT 152 148*   BMET:  Basename 09/23/12 1710 09/22/12 0630  NA 130* 134*  K 4.8 4.6  CL 91* 94*  CO2 23 23  GLUCOSE 92 99  BUN 60* 39*  CREATININE 11.93* 9.16*  CALCIUM 9.3 9.3  ALBUMIN 3.3* --   No results found for this basename: PTH:2 in the last 72 hours Iron Studies: No results found for this basename: IRON,TIBC,TRANSFERRIN,FERRITIN in the last 72 hours  Dialysis Orders: Center: [facility] on MWF [address]  EDW 133;  HD Bath 2K 2 Ca Time 4.5 Heparin 9000 to start and 4000 mid treatment. Access left upper AVF BFR 500 DFR 800  Zemplar 7 mcg IV/HD Epogen none Venofer 50/week Fri.  Assessment/Plan: 1. Right total knee replacement - secondary to osteoarthritis, 9/27 per Dr. Charlann Boxer; no Heparin with HD; rehab at [facility] pending. 2. ESRD - HD on MWF @ [facility]; K 4.8 pre-HD yesterday.  Next HD tomorrow. 3. HTN/ Volume - BP 122/76 most recently, on Norvasc 10 mg qd and Labetalol 300 mg bid, usually noncompliant as outpatient; wt 133.3 kg s/p net UF 2.3 L yesterday  with EDW 133 kg, usually does not reach DW secondary to noncompliance with HD, but DW apparently accurate as BP remained stable. 4. Anemia - Hgb down to 9.7, 13.3 pre-surgery, on weekly IV Fe, no outpatient Epogen.  Aranesp 200 mcg tomorrow. 5. Metabolic bone disease - Ca 9.3 (9.9 corrected), P 5.6, on Zemplar 7 mcg, Phoslo with meals, refuses Sensipar (last iPTH 1853). 6. Nutrition - Alb 3.3, high protein diet. 7. Medical and dialysis noncompliance - attended 7 of 13 treatments in August with average time of 228 of 270 prescribed minutes and average post-HD wt of 135.6 kg. 8. Tobacco and cocaine abuse - encourage abstinence.   LOS: 4 days   [name],[name] 09/24/2012,9:01 AM  Mr. Pitt will be d/c today to rehab ctr.  He'll continue HD at [facility].  I've encouraged him to be compliant with attendance and taking meds.   EPO 10,000 TIWat outpt HD ctr

## 2012-09-24 NOTE — Discharge Summary (Signed)
Pt d/c to [facility]. IV d/c'd, belongings with pt. Verabalizes understanding of plalcement. Report called to [employer].

## 2012-09-24 NOTE — Progress Notes (Signed)
Physical Therapy Treatment Patient Details Name: [name] [name] MRN: [id number] DOB: 1961-12-09 Today's Date: 09/24/2012 Time: 4098-1191 PT Time Calculation (min): 26 min  PT Assessment / Plan / Recommendation Comments on Treatment Session  Pt admitted s/p R TKA and progressing steadily with activity. Pt continues to demonstrate decreased strength and ROM RLE and educated for importance of HEP and encouraged to perform 3x/day as well as to properly place KI with mobility. Will continue to follow.     Follow Up Recommendations       Barriers to Discharge        Equipment Recommendations       Recommendations for Other Services    Frequency     Plan Discharge plan remains appropriate;Frequency remains appropriate    Precautions / Restrictions Precautions Precautions: Knee Required Braces or Orthoses: Knee Immobilizer - Right Knee Immobilizer - Right: Discontinue once straight leg raise with < 10 degree lag Restrictions RLE Weight Bearing: Weight bearing as tolerated   Pertinent Vitals/Pain 3/10 pain Right knee    Mobility  Bed Mobility Supine to Sit: 6: Modified independent (Device/Increase time);HOB elevated;With rails Sitting - Scoot to Edge of Bed: 6: Modified independent (Device/Increase time) Details for Bed Mobility Assistance: HOB 20degrees without cueing for sequence Transfers Sit to Stand: 5: Supervision;From bed Stand to Sit: 5: Supervision;To chair/3-in-1 Details for Transfer Assistance: cueing for hand placement , pt appropriately placing right leg with transfers Ambulation/Gait Ambulation/Gait Assistance: 5: Supervision Ambulation Distance (Feet): 250 Feet Assistive device: Rolling walker Ambulation/Gait Assistance Details: cueing for increased trunk extension and not to flex with each step of RLE as well as sequence Gait Pattern: Step-to pattern;Trunk flexed;Antalgic Gait velocity: decreased    Exercises Total Joint Exercises Heel Slides: AAROM;Right;10  reps;Seated Straight Leg Raises: AAROM;Right;10 reps;Supine   PT Diagnosis:    PT Problem List:   PT Treatment Interventions:     PT Goals Acute Rehab PT Goals PT Goal: Supine/Side to Sit - Progress: Progressing toward goal PT Goal: Stand to Sit - Progress: Progressing toward goal PT Goal: Ambulate - Progress: Progressing toward goal PT Goal: Perform Home Exercise Program - Progress: Progressing toward goal  Visit Information  Last PT Received On: 09/24/12 Assistance Needed: +1    Subjective Data  Subjective: I just haven't been able to pick that leg up   Cognition  Overall Cognitive Status: Appears within functional limits for tasks assessed/performed Arousal/Alertness: Awake/alert Behavior During Session: [facility] for tasks performed    Balance     End of Session PT - End of Session Equipment Utilized During Treatment: Gait belt;Right knee immobilizer Activity Tolerance: Patient tolerated treatment well Patient left: in chair;with nursing in room Nurse Communication: Mobility status   GP     Delorse Lek 09/24/2012, 3:20 PM Delaney Meigs, PT [phone]

## 2012-11-14 ENCOUNTER — Other Ambulatory Visit: Payer: Self-pay | Admitting: *Deleted

## 2012-11-14 DIAGNOSIS — N186 End stage renal disease: Secondary | ICD-10-CM

## 2012-11-14 DIAGNOSIS — T82598A Other mechanical complication of other cardiac and vascular devices and implants, initial encounter: Secondary | ICD-10-CM

## 2012-12-02 ENCOUNTER — Encounter: Payer: Self-pay | Admitting: Vascular Surgery

## 2012-12-03 ENCOUNTER — Encounter: Payer: Self-pay | Admitting: Vascular Surgery

## 2012-12-03 ENCOUNTER — Ambulatory Visit (INDEPENDENT_AMBULATORY_CARE_PROVIDER_SITE_OTHER): Payer: Medicare Other | Admitting: Vascular Surgery

## 2012-12-03 VITALS — BP 122/74 | HR 71 | Resp 18 | Ht 71.0 in | Wt 313.0 lb

## 2012-12-03 DIAGNOSIS — T82898A Other specified complication of vascular prosthetic devices, implants and grafts, initial encounter: Secondary | ICD-10-CM

## 2012-12-03 DIAGNOSIS — N186 End stage renal disease: Secondary | ICD-10-CM

## 2012-12-03 DIAGNOSIS — T82598A Other mechanical complication of other cardiac and vascular devices and implants, initial encounter: Secondary | ICD-10-CM

## 2012-12-03 NOTE — Progress Notes (Signed)
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 Nephrologist: Dr. Arrie Aran  HPI: [name] [name] is a 51 y.o. male who 5 years S/P creation/revision of left upper extremity Hemodialysis access. The patient denies symptoms of numbness, tingling, weakness and denies pain in the operative limb. 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 He denies any excess bleeding or scabbing in these areas. The skin has become pink but no thin over these areas.the fistula is functioning well. He probably has some central stenosis sec to enlarged veins over chest and size of AVF  Pt is on hemodialysis through  left B-C AVF on MWF.  Physical Examination  Filed Vitals:   12/03/12 1511  BP: 122/74  Pulse: 71  Resp: 18    WDWN male in NAD.  left upper extremity Incision is healed Skin color is normal  Except over areas of 2 aneurysmal areas where it has become thinner and Pink in color. The skin is mobile over these areas Hand grip is 5/5 and sensation in digits is intact; There is a good thrill and good bruit in the LUE AVF. The graft/fistula is tortuous and easily palpable and of adequate size  Assessment/Plan [name] [name] is a 51 y.o. 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 These do not appear to be in iminent danger of rupture. We suggest that these areas where skin is pinkened should be avoided for cannulization during HD. If these areas become worse or begin to bleed will need resection Clinic MD: TFE   I have examined the patient, reviewed and agree with above.  EARLY, TODD, MD 12/04/2012 12:45 PM

## 2012-12-03 NOTE — Progress Notes (Signed)
Left upper extremity arteriovenous fistula duplex performed @ [facility] 12/03/2012.

## 2013-02-04 ENCOUNTER — Ambulatory Visit (INDEPENDENT_AMBULATORY_CARE_PROVIDER_SITE_OTHER): Payer: Medicare Other | Admitting: Cardiology

## 2013-02-04 ENCOUNTER — Encounter: Payer: Self-pay | Admitting: Cardiology

## 2013-02-04 VITALS — BP 130/100 | HR 78 | Ht 71.0 in | Wt 322.8 lb

## 2013-02-04 DIAGNOSIS — I10 Essential (primary) hypertension: Secondary | ICD-10-CM | POA: Insufficient documentation

## 2013-02-04 MED ORDER — LABETALOL HCL 300 MG PO TABS: 300.0000 mg | ORAL_TABLET | Freq: Two times a day (BID) | ORAL | Status: DC

## 2013-02-04 NOTE — Progress Notes (Signed)
HPI The patient presents for evaluation of difficult to control hypertension. This has been etiology of his end-stage renal disease.  Fairly recently he reports that his blood pressure has been falling during dialysis. He reports readings as high as 190/110 on nondialysis days. However, his systolics are falling sometimes into the 80s after dialysis. He feels for all with this. He feels fatigued. He has to sleep the rest of the day. Because of this he's not been taking any of his antihypertensives. On nondialysis days he says he feels relatively well. He doesn't get chest pressure, neck or arm discomfort. He's not describing shortness of breath, PND or orthopnea. He can get around and do his minimal chores though he's not overly active.  I do note that there is a distant history of a negative stress perfusion study reported 2004. Is also a report of an echo in 2007 with an EF of 45-55%. I don't see either of these records.  No Known Allergies  Current Outpatient Prescriptions  Medication Sig Dispense Refill  . calcium acetate (PHOSLO) 667 MG capsule Take 2,668 mg by mouth 3 (three) times daily with meals.      . diphenhydrAMINE (BENADRYL) 25 mg capsule Take 1 capsule (25 mg total) by mouth every 6 (six) hours as needed for itching, allergies or sleep.  30 capsule    . docusate sodium 100 MG CAPS Take 100 mg by mouth 2 (two) times daily.  10 capsule    . enoxaparin (LOVENOX) 40 MG/0.4ML injection Inject 0.4 mLs (40 mg total) into the skin daily.  14 Syringe  0  . gabapentin (NEURONTIN) 100 MG capsule Take 100 mg by mouth at bedtime.      Marland Kitchen labetalol (NORMODYNE) 300 MG tablet Take 1 tablet (300 mg total) by mouth 2 (two) times daily. Take 300 mg twice a day on non dialysis days.  60 tablet  11  . methocarbamol (ROBAXIN) 500 MG tablet Take 1 tablet (500 mg total) by mouth every 6 (six) hours as needed (muscle spasms).  50 tablet  0   No current facility-administered medications for this visit.     Past Medical History  Diagnosis Date  . Hypertension   . Chronic kidney disease     ESRD  . Arthritis     Past Surgical History  Procedure Laterality Date  . Av fistula placement      Left x 2  . Cholecystectomy    . Total knee arthroplasty  09/20/2012    Procedure: TOTAL KNEE ARTHROPLASTY;  Surgeon: [name], MD;  Location: [facility] OR;  Service: Orthopedics;  Laterality: Right;  right total knee arthroplasty    Family History  Problem Relation Age of Onset  . Kidney failure Brother   . Kidney failure Brother     History   Social History  . Marital Status: Single    Spouse Name: N/A    Number of Children: N/A  . Years of Education: N/A   Occupational History  . Not on file.   Social History Main Topics  . Smoking status: Current Every Day Smoker -- 0.30 packs/day for 31 years    Types: Cigarettes  . Smokeless tobacco: Never Used     Comment: 2-4 cigarettes a day  . Alcohol Use: No  . Drug Use: Yes    Special: Cocaine  . Sexually Active: Not on file   Other Topics Concern  . Not on file   Social History Narrative   Lives with  sister and niece.  Two children.      ROS:  Positive for arthritis and left knee. Otherwise negative for all other systems.  PHYSICAL EXAM BP 130/100  Pulse 78  Ht 5\' 11"  (1.803 m)  Wt 322 lb 12.8 oz (146.421 kg)  BMI 45.04 kg/m2 GENERAL:  Well appearing HEENT:  Pupils equal round and reactive, fundi not visualized, oral mucosa unremarkable NECK:  No jugular venous distention, waveform within normal limits, carotid upstroke brisk and symmetric, no bruits, no thyromegaly LYMPHATICS:  No cervical, inguinal adenopathy LUNGS:  Clear to auscultation bilaterally BACK:  No CVA tenderness CHEST:  Unremarkable HEART:  PMI not displaced or sustained,S1 and S2 within normal limits, no S3, no S4, no clicks, no rubs, no murmurs ABD:  Flat, positive bowel sounds normal in frequency in pitch, no bruits, no rebound, no guarding, no midline  pulsatile mass, no hepatomegaly, no splenomegaly, obese EXT:  2 plus pulses throughout, no edema, no cyanosis no clubbing, left arm fistula with thrill and bruit, he has a pronounced pulsatile vascular structure a subclavian with to-and-fro murmur thrill it transmits to his carotid SKIN:  No rashes no nodules NEURO:  Cranial nerves II through XII grossly intact, motor grossly intact throughout PSYCH:  Cognitively intact, oriented to person place and time   EKG:  Sinus rhythm, rate 78, left axis deviation, old inferior infarct, possible old anterior infarct, first degree AV block, left axis deviation, no acute ST-T wave changes.  Anterior Q waves were not present on the previous EKG. There was however poor anterior R wave progression. 02/04/2013  ASSESSMENT AND PLAN  HTN - To begin with we are going to give him labetalol twice a day on nondialysis. He will keep a blood pressure diary so that we have more data points to suggest further management. He will be stopping amlodipine. Further adjustments will be based on future readings.  Overweight - I will begin a discuss this with him at future appointments.  Cardiomyopathy - I note a previous reduced ejection fraction. He has a markedly abnormal EKG. He may have had previous infarcts. I will start with an echocardiogram and consider further ischemia workup in the future.

## 2013-02-04 NOTE — Patient Instructions (Addendum)
Please take Labetalol 300 mg twice a day on non dialysis days. Continue all other medications as listed.  Your physician has requested that you have an echocardiogram. Echocardiography is a painless test that uses sound waves to create images of your heart. It provides your doctor with information about the size and shape of your heart and how well your heart's chambers and valves are working. This procedure takes approximately one hour. There are no restrictions for this procedure.  Follow up with Dr Antoine Poche in 2 months

## 2013-02-11 ENCOUNTER — Ambulatory Visit (HOSPITAL_COMMUNITY): Payer: Medicare Other | Attending: Cardiovascular Disease | Admitting: Radiology

## 2013-02-11 DIAGNOSIS — I059 Rheumatic mitral valve disease, unspecified: Secondary | ICD-10-CM | POA: Insufficient documentation

## 2013-02-11 DIAGNOSIS — I10 Essential (primary) hypertension: Secondary | ICD-10-CM | POA: Insufficient documentation

## 2013-02-11 DIAGNOSIS — I428 Other cardiomyopathies: Secondary | ICD-10-CM | POA: Insufficient documentation

## 2013-02-11 NOTE — Progress Notes (Signed)
Echocardiogram performed.  

## 2013-02-19 ENCOUNTER — Other Ambulatory Visit: Payer: Self-pay | Admitting: *Deleted

## 2013-02-19 DIAGNOSIS — N189 Chronic kidney disease, unspecified: Secondary | ICD-10-CM

## 2013-03-03 ENCOUNTER — Encounter: Payer: Self-pay | Admitting: Vascular Surgery

## 2013-03-04 ENCOUNTER — Encounter (INDEPENDENT_AMBULATORY_CARE_PROVIDER_SITE_OTHER): Payer: Medicare Other

## 2013-03-04 ENCOUNTER — Ambulatory Visit (INDEPENDENT_AMBULATORY_CARE_PROVIDER_SITE_OTHER): Payer: Medicare Other | Admitting: Vascular Surgery

## 2013-03-04 ENCOUNTER — Encounter: Payer: Self-pay | Admitting: Vascular Surgery

## 2013-03-04 VITALS — BP 140/87 | HR 81 | Resp 16 | Ht 71.5 in | Wt 333.0 lb
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
## 2013-03-19 ENCOUNTER — Encounter: Payer: Self-pay | Admitting: Cardiology

## 2013-04-17 ENCOUNTER — Ambulatory Visit: Payer: Medicare Other | Admitting: Cardiology

## 2013-04-23 ENCOUNTER — Encounter: Payer: Self-pay | Admitting: Cardiology

## 2013-05-27 ENCOUNTER — Inpatient Hospital Stay (HOSPITAL_COMMUNITY)
Admission: EM | Admit: 2013-05-27 | Discharge: 2013-05-30 | DRG: 280 | Disposition: A | Discharge status: Home / Self Care | Payer: Medicare Other | Attending: Internal Medicine | Admitting: Internal Medicine

## 2013-05-27 ENCOUNTER — Emergency Department (HOSPITAL_COMMUNITY): Payer: Medicare Other

## 2013-05-27 DIAGNOSIS — Z96659 Presence of unspecified artificial knee joint: Secondary | ICD-10-CM

## 2013-05-27 DIAGNOSIS — N186 End stage renal disease: Secondary | ICD-10-CM | POA: Diagnosis present

## 2013-05-27 DIAGNOSIS — E785 Hyperlipidemia, unspecified: Secondary | ICD-10-CM | POA: Diagnosis present

## 2013-05-27 DIAGNOSIS — Z5900 Homelessness unspecified: Secondary | ICD-10-CM

## 2013-05-27 DIAGNOSIS — N2581 Secondary hyperparathyroidism of renal origin: Secondary | ICD-10-CM | POA: Diagnosis present

## 2013-05-27 DIAGNOSIS — I10 Essential (primary) hypertension: Secondary | ICD-10-CM

## 2013-05-27 DIAGNOSIS — N039 Chronic nephritic syndrome with unspecified morphologic changes: Secondary | ICD-10-CM | POA: Diagnosis present

## 2013-05-27 DIAGNOSIS — R079 Chest pain, unspecified: Secondary | ICD-10-CM

## 2013-05-27 DIAGNOSIS — F17200 Nicotine dependence, unspecified, uncomplicated: Secondary | ICD-10-CM | POA: Diagnosis present

## 2013-05-27 DIAGNOSIS — D631 Anemia in chronic kidney disease: Secondary | ICD-10-CM | POA: Diagnosis present

## 2013-05-27 DIAGNOSIS — Z91199 Patient's noncompliance with other medical treatment and regimen due to unspecified reason: Secondary | ICD-10-CM

## 2013-05-27 DIAGNOSIS — Z9119 Patient's noncompliance with other medical treatment and regimen: Secondary | ICD-10-CM

## 2013-05-27 DIAGNOSIS — F1910 Other psychoactive substance abuse, uncomplicated: Secondary | ICD-10-CM

## 2013-05-27 DIAGNOSIS — F1410 Cocaine abuse, uncomplicated: Secondary | ICD-10-CM | POA: Diagnosis present

## 2013-05-27 DIAGNOSIS — I120 Hypertensive chronic kidney disease with stage 5 chronic kidney disease or end stage renal disease: Secondary | ICD-10-CM | POA: Diagnosis present

## 2013-05-27 DIAGNOSIS — I2510 Atherosclerotic heart disease of native coronary artery without angina pectoris: Secondary | ICD-10-CM | POA: Diagnosis present

## 2013-05-27 DIAGNOSIS — I214 Non-ST elevation (NSTEMI) myocardial infarction: Principal | ICD-10-CM | POA: Diagnosis present

## 2013-05-27 DIAGNOSIS — Z992 Dependence on renal dialysis: Secondary | ICD-10-CM

## 2013-05-27 HISTORY — DX: End stage renal disease: N18.6

## 2013-05-27 HISTORY — DX: Chest pain, unspecified: R07.9

## 2013-05-27 HISTORY — DX: Abnormal electrocardiogram (ECG) (EKG): R94.31

## 2013-05-27 HISTORY — DX: Other psychoactive substance abuse, uncomplicated: F19.10

## 2013-05-27 HISTORY — DX: Cardiomegaly: I51.7

## 2013-05-27 LAB — PRO B NATRIURETIC PEPTIDE: Pro B Natriuretic peptide (BNP): 4004 pg/mL — ABNORMAL HIGH (ref 0–125)

## 2013-05-27 LAB — CBC
MCH: 30.8 pg (ref 26.0–34.0)
MCHC: 35 g/dL (ref 30.0–36.0)
Platelets: 194 10*3/uL (ref 150–400)
RDW: 14 % (ref 11.5–15.5)

## 2013-05-27 LAB — POCT I-STAT TROPONIN I: Troponin i, poc: 0.18 ng/mL (ref 0.00–0.08)

## 2013-05-27 MED ORDER — NITROGLYCERIN 0.4 MG SL SUBL
0.4000 mg | SUBLINGUAL_TABLET | SUBLINGUAL | Status: DC | PRN
  Administered 2013-05-27: 0.4 mg via SUBLINGUAL

## 2013-05-27 MED ORDER — ASPIRIN 81 MG PO CHEW
324.0000 mg | CHEWABLE_TABLET | Freq: Once | ORAL | Status: AC
  Administered 2013-05-27: 324 mg via ORAL
  Filled 2013-05-27: qty 4

## 2013-05-27 NOTE — ED Notes (Signed)
Sob, non productive cough, no fevers and central chest pain which inc. With inspiration. Did not go to dialysis yesterday.

## 2013-05-27 NOTE — ED Notes (Signed)
EKG was done in triage by triage tech.

## 2013-05-27 NOTE — ED Provider Notes (Signed)
History     CSN: [id number]  Arrival date & time 05/27/13  2117   First MD Initiated Contact with Patient 05/27/13 2133      Chief Complaint  Patient presents with  . Cough  . Shortness of Breath  . Chest Pain    Patient is a 52 y.o. male presenting with chest pain.  Chest Pain Pain location:  Substernal area and L chest Pain quality: aching   Pain radiates to:  L shoulder and L arm Pain radiates to the back: no   Pain severity:  Moderate Duration:  1 hour Timing:  Constant Progression:  Resolved Chronicity:  New Context: at rest   Context comment:  Occurred while standing outside waiting for bus; lasted for one hour Relieved by:  Nothing Exacerbated by: nothing. Ineffective treatments:  None tried Associated symptoms: cough and shortness of breath   Associated symptoms: no abdominal pain, no back pain, no diaphoresis, no dizziness, no fever, no headache, no lower extremity edema, no nausea, no numbness, no orthopnea, no palpitations, no PND, not vomiting and no weakness   Risk factors: hypertension and smoking   Risk factors: no aortic disease, no coronary artery disease, no diabetes mellitus, no high cholesterol and no prior DVT/PE   Risk factors comment:  End stage renal disease  He also endorses mild SOB, which he thinks is related to missing dialysis yesterday.  He has had a non-productive cough, off and on, "forever."  He states this has been ongoing for at least several months and is not a new problem.  Denies fever or chills.   Past Medical History  Diagnosis Date  . Hypertension   . End stage renal disease     a. MWF dialysis  . Arthritis   . Polysubstance abuse     a. tobacco and cocaine  . Chest pain     a. negative MV in 2004  . Abnormal ECG   . LVH (left ventricular hypertrophy)     a. 01/2013 Echo: EF 60-65%, sev concentric LVH, nl wall motion, sev dil LA.    Past Surgical History  Procedure Laterality Date  . Av fistula placement      Left x 2   . Cholecystectomy    . Total knee arthroplasty  09/20/2012    Procedure: TOTAL KNEE ARTHROPLASTY;  Surgeon: [name], MD;  Location: [facility] OR;  Service: Orthopedics;  Laterality: Right;  right total knee arthroplasty  . Joint replacement Right 09/20/12    Family History  Problem Relation Age of Onset  . Kidney failure Brother   . Hypertension Brother   . Kidney failure Brother   . Hypertension Mother   . Diabetes Mother   . Hypertension Father   . Hypertension Sister     History  Substance Use Topics  . Smoking status: Current Every Day Smoker -- 0.30 packs/day for 31 years    Types: Cigarettes  . Smokeless tobacco: Never Used     Comment: 2-4 cigarettes a day  . Alcohol Use: No      Review of Systems  Constitutional: Negative for fever, chills and diaphoresis.  HENT: Negative for congestion, rhinorrhea, neck pain and neck stiffness.   Eyes: Negative for visual disturbance.  Respiratory: Positive for cough and shortness of breath.   Cardiovascular: Positive for chest pain. Negative for palpitations, orthopnea, leg swelling and PND.  Gastrointestinal: Negative for nausea, vomiting, abdominal pain and diarrhea.  Genitourinary: Negative for dysuria, urgency, frequency, flank pain and difficulty  urinating.  Musculoskeletal: Negative for back pain.  Skin: Negative for rash.  Neurological: Negative for dizziness, syncope, weakness, numbness and headaches.  All other systems reviewed and are negative.    Allergies  Review of patient's allergies indicates no known allergies.  Home Medications   No current outpatient prescriptions on file.  BP 165/91  Pulse 72  Temp(Src) 97.2 F (36.2 C) (Oral)  Resp 23  Ht 5\' 11"  (1.803 m)  Wt 333 lb 1.8 oz (151.1 kg)  BMI 46.48 kg/m2  SpO2 97%  Physical Exam  Nursing note and vitals reviewed. Constitutional: He is oriented to person, place, and time. He appears well-developed and well-nourished. No distress.  HENT:  Head:  Normocephalic and atraumatic.  Mouth/Throat: Oropharynx is clear and moist.  Eyes: Conjunctivae and EOM are normal. Pupils are equal, round, and reactive to light. No scleral icterus.  Neck: Normal range of motion. Neck supple. No JVD present.  Cardiovascular: Normal rate, regular rhythm and intact distal pulses.  Exam reveals no gallop and no friction rub.   Murmur heard.  Systolic murmur is present with a grade of 2/6  Pulmonary/Chest: Effort normal and breath sounds normal. No respiratory distress. He has no wheezes. He has no rales.  Abdominal: Soft. Bowel sounds are normal. He exhibits no distension. There is no tenderness. There is no rebound and no guarding.  Musculoskeletal: He exhibits no edema.  Neurological: He is alert and oriented to person, place, and time. No cranial nerve deficit. He exhibits normal muscle tone. Coordination normal.  Skin: Skin is warm and dry. He is not diaphoretic.    ED Course  Procedures (including critical care time)  Labs Reviewed  CBC - Abnormal; Notable for the following:    RBC 3.70 (*)    Hemoglobin 11.4 (*)    HCT 32.6 (*)    All other components within normal limits  BASIC METABOLIC PANEL - Abnormal; Notable for the following:    BUN 83 (*)    Creatinine, Ser 15.30 (*)    GFR calc non Af Amer 3 (*)    GFR calc Af Amer 4 (*)    All other components within normal limits  PRO B NATRIURETIC PEPTIDE - Abnormal; Notable for the following:    Pro B Natriuretic peptide (BNP) 4004.0 (*)    All other components within normal limits  POCT I-STAT TROPONIN I - Abnormal; Notable for the following:    Troponin i, poc 0.18 (*)    All other components within normal limits   Dg Chest Portable 1 View  05/27/2013   *RADIOLOGY REPORT*  Clinical Data: Cough, shortness of breath and chest pain. Tightness in the chest and left arm.  PORTABLE CHEST - 1 VIEW  Comparison: Chest x-ray 09/02/2012.  Findings: Lung volumes are normal.  No acute consolidative airspace  disease.  No pleural effusions.  Mild pulmonary venous congestion, without frank pulmonary edema.  Mild cardiomegaly.  Upper mediastinal contours are within normal limits.  Atherosclerosis in the thoracic aorta.  IMPRESSION: 1.  Mild cardiomegaly with mild pulmonary venous congestion, but no frank pulmonary edema at this time. 2.  Atherosclerosis.   Original Report Authenticated By: Trudie Reed, M.D.    Date: 05/27/2013  Rate: 85  Rhythm: normal sinus rhythm  QRS Axis: left  Intervals: PR prolonged  ST/T Wave abnormalities: nonspecific T wave changes  Conduction Disutrbances:first-degree A-V block   Narrative Interpretation:   Old EKG Reviewed: unchanged    1. Chest pain   2.  End stage renal disease   3. HTN (hypertension)   4. Polysubstance abuse       MDM  52 yo M with ESRD here with CP, radiated to L arm, lasted one hour.  Currently pain free.  Cardiac risk factors include ESRD, HTN, and smoking.  No history of CAD.    Hypertensive to 200/98; otherwise normal vitals.  Well appearing, in NAD.  Cardiopulmonary exam remarkable only for soft systolic murmur, 2+ bilateral radial pulses, no LE edema.  Chest CTAB.  EKG with NSR, no acute ischemic changes.    If troponin negative, feel he will need admit for ACS r/o.  On reevaluation, BP improved to 160s SBP after NTG SL.  Continues to be pain free.  Spoke with cardiology about modestly elevated troponin; they feel it's likely 2/2 ESRD, missed dialysis, but he needs to be admitted to have Tn cycled and further risk stratification; they asked we hold anticoagulation for now.  Admitted to hospitalist         Toney Sang, MD 05/28/13 [phone]

## 2013-05-28 ENCOUNTER — Encounter (HOSPITAL_COMMUNITY): Payer: Self-pay | Admitting: Internal Medicine

## 2013-05-28 DIAGNOSIS — I214 Non-ST elevation (NSTEMI) myocardial infarction: Secondary | ICD-10-CM

## 2013-05-28 DIAGNOSIS — R079 Chest pain, unspecified: Secondary | ICD-10-CM | POA: Diagnosis present

## 2013-05-28 DIAGNOSIS — F1910 Other psychoactive substance abuse, uncomplicated: Secondary | ICD-10-CM | POA: Diagnosis present

## 2013-05-28 DIAGNOSIS — N186 End stage renal disease: Secondary | ICD-10-CM

## 2013-05-28 LAB — BASIC METABOLIC PANEL
Calcium: 9.7 mg/dL (ref 8.4–10.5)
GFR calc Af Amer: 4 mL/min — ABNORMAL LOW (ref >90)
GFR calc non Af Amer: 3 mL/min — ABNORMAL LOW (ref >90)
Sodium: 139 mEq/L (ref 135–145)

## 2013-05-28 LAB — CBC WITH DIFFERENTIAL/PLATELET
Basophils Absolute: 0 10*3/uL (ref 0.0–0.1)
Basophils Relative: 0 % (ref 0–1)
Eosinophils Absolute: 0.2 10*3/uL (ref 0.0–0.7)
Hemoglobin: 10.5 g/dL — ABNORMAL LOW (ref 13.0–17.0)
MCH: 29.8 pg (ref 26.0–34.0)
MCHC: 34 g/dL (ref 30.0–36.0)
Monocytes Relative: 8 % (ref 3–12)
Neutrophils Relative %: 65 % (ref 43–77)
RDW: 14.1 % (ref 11.5–15.5)

## 2013-05-28 LAB — HEPARIN LEVEL (UNFRACTIONATED): Heparin Unfractionated: 0.36 IU/mL (ref 0.30–0.70)

## 2013-05-28 LAB — COMPREHENSIVE METABOLIC PANEL
ALT: 10 U/L (ref 0–53)
AST: 14 U/L (ref 0–37)
Alkaline Phosphatase: 86 U/L (ref 39–117)
CO2: 20 mEq/L (ref 19–32)
Calcium: 9.1 mg/dL (ref 8.4–10.5)
Chloride: 98 mEq/L (ref 96–112)
GFR calc non Af Amer: 3 mL/min — ABNORMAL LOW (ref >90)
Glucose, Bld: 82 mg/dL (ref 70–99)
Potassium: 4.9 mEq/L (ref 3.5–5.1)
Sodium: 139 mEq/L (ref 135–145)
Total Bilirubin: 0.2 mg/dL — ABNORMAL LOW (ref 0.3–1.2)

## 2013-05-28 LAB — GLUCOSE, CAPILLARY
Glucose-Capillary: 79 mg/dL (ref 70–99)
Glucose-Capillary: 47 mg/dL — ABNORMAL LOW (ref 70–99)
Glucose-Capillary: 80 mg/dL (ref 70–99)

## 2013-05-28 LAB — TROPONIN I: Troponin I: 0.33 ng/mL (ref <0.30)

## 2013-05-28 LAB — PROTIME-INR
INR: 1.08 (ref 0.00–1.49)
Prothrombin Time: 13.9 seconds (ref 11.6–15.2)

## 2013-05-28 MED ORDER — SODIUM CHLORIDE 0.9 % IJ SOLN: 3.0000 mL | INTRAMUSCULAR | Status: DC | PRN

## 2013-05-28 MED ORDER — HEPARIN (PORCINE) IN NACL 2-0.9 UNIT/ML-% IJ SOLN
INTRAMUSCULAR | Status: AC
  Filled 2013-05-28: qty 1000

## 2013-05-28 MED ORDER — ONDANSETRON HCL 4 MG/2ML IJ SOLN: 4.0000 mg | Freq: Four times a day (QID) | INTRAMUSCULAR | Status: DC | PRN

## 2013-05-28 MED ORDER — DEXTROSE 50 % IV SOLN: 25.0000 mL | Freq: Once | INTRAVENOUS | Status: AC | PRN

## 2013-05-28 MED ORDER — SODIUM CHLORIDE 0.9 % IJ SOLN: 3.0000 mL | Freq: Two times a day (BID) | INTRAMUSCULAR | Status: DC

## 2013-05-28 MED ORDER — ACETAMINOPHEN 650 MG RE SUPP: 650.0000 mg | Freq: Four times a day (QID) | RECTAL | Status: DC | PRN

## 2013-05-28 MED ORDER — ATORVASTATIN CALCIUM 80 MG PO TABS
80.0000 mg | ORAL_TABLET | Freq: Every day | ORAL | Status: DC
  Administered 2013-05-28 – 2013-05-29 (×2): 80 mg via ORAL
  Filled 2013-05-28 (×3): qty 1

## 2013-05-28 MED ORDER — LIDOCAINE HCL (PF) 1 % IJ SOLN: 5.0000 mL | INTRAMUSCULAR | Status: DC | PRN

## 2013-05-28 MED ORDER — LABETALOL HCL 300 MG PO TABS
300.0000 mg | ORAL_TABLET | ORAL | Status: DC
  Administered 2013-05-29 (×2): 300 mg via ORAL
  Filled 2013-05-28 (×2): qty 1

## 2013-05-28 MED ORDER — GABAPENTIN 100 MG PO CAPS
100.0000 mg | ORAL_CAPSULE | Freq: Every evening | ORAL | Status: DC | PRN
  Filled 2013-05-28: qty 1

## 2013-05-28 MED ORDER — LIDOCAINE HCL (PF) 1 % IJ SOLN
INTRAMUSCULAR | Status: AC
  Filled 2013-05-28: qty 30

## 2013-05-28 MED ORDER — ONDANSETRON HCL 4 MG PO TABS: 4.0000 mg | ORAL_TABLET | Freq: Four times a day (QID) | ORAL | Status: DC | PRN

## 2013-05-28 MED ORDER — ASPIRIN 81 MG PO CHEW
324.0000 mg | CHEWABLE_TABLET | ORAL | Status: DC
  Administered 2013-05-28: 324 mg via ORAL
  Filled 2013-05-28: qty 4

## 2013-05-28 MED ORDER — HEPARIN (PORCINE) IN NACL 100-0.45 UNIT/ML-% IJ SOLN
2000.0000 [IU]/h | INTRAMUSCULAR | Status: DC
  Administered 2013-05-28 (×2): 1700 [IU]/h via INTRAVENOUS
  Filled 2013-05-28 (×5): qty 250

## 2013-05-28 MED ORDER — SODIUM CHLORIDE 0.9 % IV SOLN: 100.0000 mL | INTRAVENOUS | Status: DC | PRN

## 2013-05-28 MED ORDER — ACETAMINOPHEN 325 MG PO TABS: 650.0000 mg | ORAL_TABLET | Freq: Four times a day (QID) | ORAL | Status: DC | PRN

## 2013-05-28 MED ORDER — ASPIRIN EC 325 MG PO TBEC: 325.0000 mg | DELAYED_RELEASE_TABLET | Freq: Every day | ORAL | Status: DC

## 2013-05-28 MED ORDER — ALTEPLASE 2 MG IJ SOLR
2.0000 mg | Freq: Once | INTRAMUSCULAR | Status: DC | PRN
  Filled 2013-05-28: qty 2

## 2013-05-28 MED ORDER — MORPHINE SULFATE 2 MG/ML IJ SOLN: 1.0000 mg | INTRAMUSCULAR | Status: DC | PRN

## 2013-05-28 MED ORDER — HEPARIN SODIUM (PORCINE) 1000 UNIT/ML DIALYSIS
5000.0000 [IU] | INTRAMUSCULAR | Status: DC | PRN
  Filled 2013-05-28: qty 5

## 2013-05-28 MED ORDER — SODIUM CHLORIDE 0.9 % IV SOLN: 250.0000 mL | INTRAVENOUS | Status: DC | PRN

## 2013-05-28 MED ORDER — SODIUM CHLORIDE 0.9 % IJ SOLN
3.0000 mL | Freq: Two times a day (BID) | INTRAMUSCULAR | Status: DC
  Administered 2013-05-28 – 2013-05-30 (×2): 3 mL via INTRAVENOUS

## 2013-05-28 MED ORDER — NEPRO/CARBSTEADY PO LIQD
237.0000 mL | ORAL | Status: DC | PRN
  Filled 2013-05-28: qty 237

## 2013-05-28 MED ORDER — HEPARIN SODIUM (PORCINE) 1000 UNIT/ML DIALYSIS: 1000.0000 [IU] | INTRAMUSCULAR | Status: DC | PRN

## 2013-05-28 MED ORDER — LIDOCAINE-PRILOCAINE 2.5-2.5 % EX CREA
1.0000 "application " | TOPICAL_CREAM | CUTANEOUS | Status: DC | PRN
  Filled 2013-05-28: qty 5

## 2013-05-28 MED ORDER — NITROGLYCERIN 0.4 MG SL SUBL: 0.4000 mg | SUBLINGUAL_TABLET | SUBLINGUAL | Status: DC | PRN

## 2013-05-28 MED ORDER — CALCIUM ACETATE 667 MG PO CAPS
2668.0000 mg | ORAL_CAPSULE | Freq: Three times a day (TID) | ORAL | Status: DC
  Administered 2013-05-29 – 2013-05-30 (×2): 2668 mg via ORAL
  Filled 2013-05-28 (×10): qty 4

## 2013-05-28 MED ORDER — DEXTROSE 50 % IV SOLN
INTRAVENOUS | Status: AC
  Administered 2013-05-28: 25 mL via INTRAVENOUS
  Filled 2013-05-28: qty 50

## 2013-05-28 MED ORDER — PENTAFLUOROPROP-TETRAFLUOROETH EX AERO: 1.0000 "application " | INHALATION_SPRAY | CUTANEOUS | Status: DC | PRN

## 2013-05-28 MED ORDER — HEPARIN BOLUS VIA INFUSION
4000.0000 [IU] | Freq: Once | INTRAVENOUS | Status: AC
  Administered 2013-05-28: 4000 [IU] via INTRAVENOUS
  Filled 2013-05-28: qty 4000

## 2013-05-28 MED ORDER — HEPARIN BOLUS VIA INFUSION
2000.0000 [IU] | Freq: Once | INTRAVENOUS | Status: AC
  Administered 2013-05-28: 2000 [IU] via INTRAVENOUS
  Filled 2013-05-28: qty 2000

## 2013-05-28 MED ORDER — SODIUM CHLORIDE 0.9 % IJ SOLN
3.0000 mL | Freq: Two times a day (BID) | INTRAMUSCULAR | Status: DC
  Administered 2013-05-28 – 2013-05-30 (×3): 3 mL via INTRAVENOUS

## 2013-05-28 NOTE — ED Provider Notes (Signed)
I saw and evaluated the patient, reviewed the resident's note and I agree with the findings and plan. If applicable, I agree with the resident's interpretation of the EKG.  If applicable, I was present for critical portions of any procedures performed.  Episode of substernal chest pain less than 1 hour it radiated to both shoulders. Associated with shortness of breath. Missed dialysis today. Recent cocaine use. EKG is unchanged. Troponin is positive. Discussed with Dr. [place] Callas of cardiology who recommends medical admission given lack of chest pain now, volume overload and ESRD.  Glynn Octave, MD 05/28/13 [phone]

## 2013-05-28 NOTE — Progress Notes (Signed)
Hypoglycemic Event  CBG: 47  Treatment: D50 IV 25 mL  Symptoms: None  Follow-up CBG: Time:1245 CBG Result:86  Possible Reasons for Event: Inadequate meal intake  Comments/MD notified:    [name] [name]  Remember to initiate Hypoglycemia Order Set & complete  

## 2013-05-28 NOTE — Interval H&P Note (Signed)
Cath Lab Visit (complete for each Cath Lab visit)  Clinical Evaluation Leading to the Procedure:   ACS: yes  Non-ACS:    Anginal Classification: CCS IV  Anti-ischemic medical therapy: Minimal Therapy (1 class of medications)  Non-Invasive Test Results: No non-invasive testing performed  Prior CABG: No previous CABG      History and Physical Interval Note:  05/28/2013 1:40 PM  [name] [name]  has presented today for surgery, with the diagnosis of cp  The various methods of treatment have been discussed with the patient and family. After consideration of risks, benefits and other options for treatment, the patient has consented to  Procedure(s): LEFT HEART CATHETERIZATION WITH CORONARY ANGIOGRAM (N/A) as a surgical intervention .  The patient's history has been reviewed, patient examined, no change in status, stable for surgery.  I have reviewed the patient's chart and labs.  Questions were answered to the patient's satisfaction.     Lorine Bears

## 2013-05-28 NOTE — H&P (View-Only) (Signed)
 CARDIOLOGY CONSULT NOTE  Patient ID: [name] [name] MRN: [id number], DOB/AGE: 01/29/1961   Admit date: 05/27/2013 Date of Consult: 05/28/2013  Primary Nephrologist:  A. Powell, MD  Primary Cardiologist: J. Hochrein, MD  Pt. Profile  51 y/o male with h/o poorly controlled htn and esrd who was admitted yesterday with chest pain and has been found to have a mildly elevated troponin.  Problem List  Past Medical History  Diagnosis Date  . Hypertension   . End stage renal disease     a. MWF dialysis  . Arthritis   . Polysubstance abuse     a. tobacco and cocaine  . Chest pain     a. negative MV in 2004  . Abnormal ECG   . LVH (left ventricular hypertrophy)     a. 01/2013 Echo: EF 60-65%, sev concentric LVH, nl wall motion, sev dil LA.    Past Surgical History  Procedure Laterality Date  . Av fistula placement      Left x 2  . Cholecystectomy    . Total knee arthroplasty  09/20/2012    Procedure: TOTAL KNEE ARTHROPLASTY;  Surgeon: Matthew D Olin, MD;  Location: [facility] OR;  Service: Orthopedics;  Laterality: Right;  right total knee arthroplasty  . Joint replacement Right 09/20/12    Allergies  No Known Allergies  HPI   51 y/o male with a long h/o poorly controlled htn and ESRD on MWF dialysis.  He also has a h/o chest pain and underwent a nl myoview in 2004.  He also has a h/o tobacco and cocaine abuse.  He says that he quit cocaine about 7 mos ago but relapsed over this past weekend.  He is not very active @ home, having had a right knee replacement last fall.  He has been using a cane to get around and so long as he walks at a slow pace, he does not experience DOE.  He occasionally will experience fleeting chest pain but he thinks this is rare and he can't tie it to any particular activity.  He has been living with a friend in [place] but was kicked out of the house of Friday and says he's been living on the streets since.  He Over this past weekend, he used cocaine and then he missed  dialysis on Monday.  Yesterday, while standing and waiting for the bus, he developed retrosternal chest tightness radiating to his left shoulder and arm, along with left arm numbness.  This was followed by blurring of vision and lightheadedness.  No associated sob, diaphoresis, n/v.  He got on the bus and by the time he got to the depot, Ss had completely resolved without intervention.  Total duration ~ 45-60 minutes.  At that point he decided to present to the ED.  There, he was hypertensive @ 200/98.  ECG was w/o acute changes however troponin returned elevated @ 0.34.  He was subsequently admitted and placed on heparin.  Troponin peaked @ 0.39.  He has had no further chest pain.  He denies palpitations, dyspnea, pnd, orthopnea, n, v, syncope, weight gain, or early satiety.  Inpatient Medications  . aspirin EC  325 mg Oral Daily  . calcium acetate  2,668 mg Oral TID WC  . [START ON 05/29/2013] labetalol  300 mg Oral Custom  . sodium chloride  3 mL Intravenous Q12H  . sodium chloride  3 mL Intravenous Q12H    *  Heparin gtt  Family History Family History  Problem   Relation Age of Onset  . Kidney failure Brother   . Hypertension Brother   . Kidney failure Brother   . Hypertension Mother   . Diabetes Mother   . Hypertension Father   . Hypertension Sister     Social History History   Social History  . Marital Status: Single    Spouse Name: N/A    Number of Children: N/A  . Years of Education: N/A   Occupational History  . Not on file.   Social History Main Topics  . Smoking status: Current Every Day Smoker -- 0.30 packs/day for 31 years    Types: Cigarettes  . Smokeless tobacco: Never Used     Comment: 2-4 cigarettes a day  . Alcohol Use: No  . Drug Use: Yes    Special: Cocaine     Comment: h/o cocain abuse.  quit late 2013 but resumed 05/2013.  . Sexually Active: Not on file   Other Topics Concern  . Not on file   Social History Narrative   Was living with a friend in [place]  but was kicked out on Friday and has been living "on the street" since.  He plans to go to a homeless shelter in [place] when he leaves here.    Review of Systems  General:  No chills, fever, night sweats or weight changes.  Cardiovascular:  +++ chest pain, no dyspnea on exertion, edema, orthopnea, palpitations, paroxysmal nocturnal dyspnea. Dermatological: No rash, lesions/masses Respiratory: No cough, dyspnea Urologic: No hematuria, dysuria Abdominal:   No nausea, vomiting, diarrhea, bright red blood per rectum, melena, or hematemesis Neurologic:  +++ visual changes, no wkns, changes in mental status. All other systems reviewed and are otherwise negative except as noted above.  Physical Exam  Blood pressure 163/78, pulse 79, temperature 98.4 F (36.9 C), temperature source Oral, resp. rate 16, height 5' 11" (1.803 m), weight 333 lb 9.6 oz (151.32 kg), SpO2 98.00%.  General: Pleasant, NAD Psych: Normal affect. Neuro: Alert and oriented X 3. Moves all extremities spontaneously. HEENT: Normal  Neck: Supple, obese, difficult to assess jvp.  Soft R bruit, loud L bruit ->2/2 avf in left forearm. Lungs:  Resp regular and unlabored, CTA. Heart: RRR no s3, s4, soft systolic murmur @ USB. Abdomen: Soft, non-tender, non-distended, BS + x 4.  Extremities: No clubbing, cyanosis.  Trace bilat LE edema. DP/PT/Radials 1+ and equal bilaterally.  Labs   Recent Labs  05/28/13 0019 05/28/13 0152 05/28/13 0750  TROPONINI 0.34* 0.39* 0.37*   Lab Results  Component Value Date   WBC 6.6 05/28/2013   HGB 10.5* 05/28/2013   HCT 30.9* 05/28/2013   MCV 87.8 05/28/2013   PLT 184 05/28/2013     Recent Labs Lab 05/28/13 0505  NA 139  K 4.9  CL 98  CO2 20  BUN 87*  CREATININE 15.87*  CALCIUM 9.1  PROT 6.8  BILITOT 0.2*  ALKPHOS 86  ALT 10  AST 14  GLUCOSE 82   Radiology/Studies  Dg Chest Portable 1 View  05/27/2013   *RADIOLOGY REPORT*  Clinical Data: Cough, shortness of breath and chest  pain. Tightness in the chest and left arm.  PORTABLE CHEST - 1 VIEW    IMPRESSION: 1.  Mild cardiomegaly with mild pulmonary venous congestion, but no frank pulmonary edema at this time. 2.  Atherosclerosis.   Original Report Authenticated By: Daniel Entrikin, M.D.   ECG  RSR, 85, 1st deg avb, inf q's, poor R prog, no acute st/t   changes.  ASSESSMENT AND PLAN  1.  NSTEMI:  Pt presented following a 1 hr episode of chest pain.  He has mild elevation of troponin in the setting of ESRD and creat of 15 (missed dialysis on Monday).  He has multiple risk factors for CAD including poorly controlled htn, esrd, obesity, and tob abuse.  We will plan on diagnostic cath this AM.  Cont asa, bb.  Add high potency statin.  Dialysis per nephrology today.  2.  ESRD:  Per renal.  3.  HTN:  BP elevated but stable.  Cont bb.  Suspect noncompliance in light of homelessness over the weekend.  Follow and adjust as necessary.  4.  HL:  Check lipids/lft's.  Adding statin.  5.  Polysubstance Abuse:  Complete cessation advised.    6.  Homelessness:  Since Friday.  Plans to go to [place] shelter after d/c.  Will ask SW to assist.  7.  Anemia:  Stable.   Signed, Christopher Berge, NP 05/28/2013, 10:33 AM   Patient examined chart reviewed.  Given history of reduced EF.  Labile BP during dialysis, abnormal ECG and difficulties with non invasive imaging at his size favor diagnostic cath.  Risks discssed willing to proceed today.    [name] [name]  

## 2013-05-28 NOTE — H&P (Signed)
[facility] History and Physical  [name] [name] ZOX:[id number] DOB: [birth year] DOA: 05/27/2013  Referring physician: ER physician. PCP: No primary provider on file.  Chief Complaint: Chest pain.  HPI: [name] [name] is a 52 y.o. male history of ESRD on hemodialysis, hypertension and cocaine and tobacco abuse presented to the ER because of chest pain. Patient experienced chest pain last evening around 6 PM. He was waiting for the bus when he started developing retrosternal and left-sided chest pressure nonradiating with no associated shortness of breath diaphoresis or productive cough. By the time patient reached ER patient chest pain had resolved. EKG does not show anything acute. Cardiac enzymes came positive and cardiologist on call Dr. Compton Callas was consulted by ER physician and at this time since patient's chest pain-free patient has been admitted to hospitalist service. Patient admits to taking cocaine 2 days ago. Has dialysis on Monday Wednesday and Friday which patient states patient has not missed.  Review of Systems: As presented in the history of presenting illness, rest negative.  Past Medical History  Diagnosis Date  . Hypertension   . Chronic kidney disease     ESRD  . Arthritis    Past Surgical History  Procedure Laterality Date  . Av fistula placement      Left x 2  . Cholecystectomy    . Total knee arthroplasty  09/20/2012    Procedure: TOTAL KNEE ARTHROPLASTY;  Surgeon: [name], MD;  Location: [facility] OR;  Service: Orthopedics;  Laterality: Right;  right total knee arthroplasty  . Joint replacement Right 09/20/12   Social History:  reports that he has been smoking Cigarettes.  He has a 9.3 pack-year smoking history. He has never used smokeless tobacco. He reports that he uses illicit drugs (Cocaine). He reports that he does not drink alcohol. Lives at home. where does patient live-- Can do ADLs. Can patient participate in ADLs?  No Known Allergies  Family  History  Problem Relation Age of Onset  . Kidney failure Brother   . Hypertension Brother   . Kidney failure Brother   . Hypertension Mother   . Diabetes Mother   . Hypertension Father   . Hypertension Sister       Prior to Admission medications   Medication Sig Start Date End Date Taking? Authorizing Provider  calcium acetate (PHOSLO) 667 MG capsule Take 2,668 mg by mouth 3 (three) times daily with meals.   Yes Historical Provider, MD  gabapentin (NEURONTIN) 100 MG capsule Take 100 mg by mouth at bedtime as needed (nerve pain).    Yes Historical Provider, MD  labetalol (NORMODYNE) 300 MG tablet Take 300 mg by mouth 2 (two) times daily. Take 300 mg twice a day on non dialysis days. 02/04/13  Yes Rollene Rotunda, MD   Physical Exam: Filed Vitals:   05/28/13 0015 05/28/13 0030 05/28/13 0045 05/28/13 0100  BP: 171/99 166/117 137/85 130/78  Pulse: 82 71 78 77  Temp:      TempSrc:      Resp: 19 19 13 14   SpO2: 97% 98% 91% 94%     General:  Well-developed and nourished.  Eyes: Anicteric no pallor.  ENT: No discharge from the ears eyes nose mouth.  Neck: No mass felt.  Cardiovascular: S1-S2 heard.  Respiratory: No rhonchi or crepitations.  Abdomen: Soft nontender bowel sounds present.  Skin: No rash.  Musculoskeletal: No edema.  Psychiatric: Appears normal.  Neurologic: Alert awake oriented to time place and person. Moves all extremities.  Labs on Admission:  Basic Metabolic Panel:  Recent Labs Lab 05/27/13 2129  NA 139  K 4.8  CL 96  CO2 21  GLUCOSE 98  BUN 83*  CREATININE 15.30*  CALCIUM 9.7   Liver Function Tests: No results found for this basename: AST, ALT, ALKPHOS, BILITOT, PROT, ALBUMIN,  in the last 168 hours No results found for this basename: LIPASE, AMYLASE,  in the last 168 hours No results found for this basename: AMMONIA,  in the last 168 hours CBC:  Recent Labs Lab 05/27/13 2129  WBC 7.0  HGB 11.4*  HCT 32.6*  MCV 88.1  PLT 194    Cardiac Enzymes:  Recent Labs Lab 05/28/13 0019  TROPONINI 0.34*    BNP (last 3 results)  Recent Labs  05/27/13 2129  PROBNP 4004.0*   CBG: No results found for this basename: GLUCAP,  in the last 168 hours  Radiological Exams on Admission: Dg Chest Portable 1 View  05/27/2013   *RADIOLOGY REPORT*  Clinical Data: Cough, shortness of breath and chest pain. Tightness in the chest and left arm.  PORTABLE CHEST - 1 VIEW  Comparison: Chest x-ray 09/02/2012.  Findings: Lung volumes are normal.  No acute consolidative airspace disease.  No pleural effusions.  Mild pulmonary venous congestion, without frank pulmonary edema.  Mild cardiomegaly.  Upper mediastinal contours are within normal limits.  Atherosclerosis in the thoracic aorta.  IMPRESSION: 1.  Mild cardiomegaly with mild pulmonary venous congestion, but no frank pulmonary edema at this time. 2.  Atherosclerosis.   Original Report Authenticated By: Trudie Reed, M.D.    EKG: Independently reviewed. Normal sinus rhythm.  Assessment/Plan Principal Problem:   Chest pain Active Problems:   HTN (hypertension)   End stage renal disease   Polysubstance abuse   1. Chest pain with positive troponins concerning for non-ST elevation MI - probably precipitated by cocaine. I have placed patient on heparin infusion and aspirin. Nitroglycerin when necessary. Cycle cardiac markers. In anticipation of possible cardiac procedures I place patient n.p.o. 2. ESRD on hemodialysis Monday Wednesday and Friday - consult nephrology for dialysis. 3. Hypertension - continue home medications. 4. Polysubstance abuse - strongly advised patient to quit smoking and abusing cocaine.    Code Status: Full code.  Family Communication: None.  Disposition Plan: Admit to inpatient.    KAKRAKANDY,ARSHAD N. [facility] Pager [phone].  If 7PM-7AM, please contact night-coverage [email or web address] Password [email or web address] 05/28/2013, 1:41 AM

## 2013-05-28 NOTE — Progress Notes (Signed)
ANTICOAGULATION CONSULT NOTE - Follow Up Consult  Pharmacy Consult for Heparin Indication: chest pain/ACS  No Known Allergies  Patient Measurements: Height: 5\' 11"  (180.3 cm) Weight: 324 lb 11.8 oz (147.3 kg) IBW/kg (Calculated) : 75.3  Vital Signs: Temp: 97.9 F (36.6 C) (06/04 2001) Temp src: Oral (06/04 2001) BP: 155/88 mmHg (06/04 2001) Pulse Rate: 78 (06/04 2001)    Recent Labs  05/27/13 2129  05/28/13 0152 05/28/13 0505 05/28/13 0750 05/28/13 0815 05/28/13 1432 05/28/13 1511  HGB 11.4*  --   --  10.5*  --   --   --   --   HCT 32.6*  --   --  30.9*  --   --   --   --   PLT 194  --   --  184  --   --   --   --   LABPROT  --   --   --   --   --   --  13.9  --   INR  --   --   --   --   --   --  1.08  --   HEPARINUNFRC  --   --   --   --   --  0.36  --  <0.10*  CREATININE 15.30*  --   --  15.87*  --   --   --   --   TROPONINI  --   < > 0.39*  --  0.37*  --  0.33*  --   < > = values in this interval not displayed.  Estimated Creatinine Clearance: 8 ml/min (by C-G formula based on Cr of 15.87).  Assessment: 52 yo male with positive cardiac enzymes being continued on IV heparin for ACS/STEMI. First heparin level therapeutic at 0.36 at current rate of heparin 1700 units/hr.  Heparin turned off for cath, then due to acute SOB on the table went from cath to HD, heparin was off for duration of HD.  Access was not obtained in the cath lab and patient is having no bleeding per RN.  Goal of Therapy:  Heparin level 0.3-0.7 units/ml Monitor platelets by anticoagulation protocol: Yes   Plan:  1. Resume heparin, 2000 units IV x 1 then  1700 units/hr 2. Confirm with 8 hour heparin level  3. Monitor signs/symptoms of bleeding    Thank you for allowing pharmacy to be a part of this patients care team.  Lovenia Kim Pharm.D., BCPS Clinical Pharmacist 05/28/2013 8:24 PM Pager: [phone] Phone: [phone]

## 2013-05-28 NOTE — Consult Note (Signed)
CARDIOLOGY CONSULT NOTE  Patient ID: [name] [name] MRN: [id number], DOB/AGE: [birth year]   Admit date: 05/27/2013 Date of Consult: 05/28/2013  Primary Nephrologist:  A. Lowell Guitar, MD  Primary Cardiologist: J. Hochrein, MD  Pt. Profile  52 y/o male with h/o poorly controlled htn and esrd who was admitted yesterday with chest pain and has been found to have a mildly elevated troponin.  Problem List  Past Medical History  Diagnosis Date  . Hypertension   . End stage renal disease     a. MWF dialysis  . Arthritis   . Polysubstance abuse     a. tobacco and cocaine  . Chest pain     a. negative MV in 2004  . Abnormal ECG   . LVH (left ventricular hypertrophy)     a. 01/2013 Echo: EF 60-65%, sev concentric LVH, nl wall motion, sev dil LA.    Past Surgical History  Procedure Laterality Date  . Av fistula placement      Left x 2  . Cholecystectomy    . Total knee arthroplasty  09/20/2012    Procedure: TOTAL KNEE ARTHROPLASTY;  Surgeon: [name], MD;  Location: [facility] OR;  Service: Orthopedics;  Laterality: Right;  right total knee arthroplasty  . Joint replacement Right 09/20/12    Allergies  No Known Allergies  HPI   51 y/o male with a long h/o poorly controlled htn and ESRD on MWF dialysis.  He also has a h/o chest pain and underwent a nl myoview in 2004.  He also has a h/o tobacco and cocaine abuse.  He says that he quit cocaine about 7 mos ago but relapsed over this past weekend.  He is not very active @ home, having had a right knee replacement last fall.  He has been using a cane to get around and so long as he walks at a slow pace, he does not experience DOE.  He occasionally will experience fleeting chest pain but he thinks this is rare and he can't tie it to any particular activity.  He has been living with a friend in [place] but was kicked out of the house of Friday and says he's been living on the streets since.  He Over this past weekend, he used cocaine and then he missed  dialysis on Monday.  Yesterday, while standing and waiting for the bus, he developed retrosternal chest tightness radiating to his left shoulder and arm, along with left arm numbness.  This was followed by blurring of vision and lightheadedness.  No associated sob, diaphoresis, n/v.  He got on the bus and by the time he got to the depot, Ss had completely resolved without intervention.  Total duration ~ 45-60 minutes.  At that point he decided to present to the ED.  There, he was hypertensive @ 200/98.  ECG was w/o acute changes however troponin returned elevated @ 0.34.  He was subsequently admitted and placed on heparin.  Troponin peaked @ 0.39.  He has had no further chest pain.  He denies palpitations, dyspnea, pnd, orthopnea, n, v, syncope, weight gain, or early satiety.  Inpatient Medications  . aspirin EC  325 mg Oral Daily  . calcium acetate  2,668 mg Oral TID WC  . [START ON 05/29/2013] labetalol  300 mg Oral Custom  . sodium chloride  3 mL Intravenous Q12H  . sodium chloride  3 mL Intravenous Q12H    *  Heparin gtt  Family History Family History  Problem  Relation Age of Onset  . Kidney failure Brother   . Hypertension Brother   . Kidney failure Brother   . Hypertension Mother   . Diabetes Mother   . Hypertension Father   . Hypertension Sister     Social History History   Social History  . Marital Status: Single    Spouse Name: N/A    Number of Children: N/A  . Years of Education: N/A   Occupational History  . Not on file.   Social History Main Topics  . Smoking status: Current Every Day Smoker -- 0.30 packs/day for 31 years    Types: Cigarettes  . Smokeless tobacco: Never Used     Comment: 2-4 cigarettes a day  . Alcohol Use: No  . Drug Use: Yes    Special: Cocaine     Comment: h/o cocain abuse.  quit late 2013 but resumed 05/2013.  Marland Kitchen Sexually Active: Not on file   Other Topics Concern  . Not on file   Social History Narrative   Was living with a friend in [place]  but was kicked out on Friday and has been living "on the street" since.  He plans to go to a homeless shelter in [facility] when he leaves here.    Review of Systems  General:  No chills, fever, night sweats or weight changes.  Cardiovascular:  +++ chest pain, no dyspnea on exertion, edema, orthopnea, palpitations, paroxysmal nocturnal dyspnea. Dermatological: No rash, lesions/masses Respiratory: No cough, dyspnea Urologic: No hematuria, dysuria Abdominal:   No nausea, vomiting, diarrhea, bright red blood per rectum, melena, or hematemesis Neurologic:  +++ visual changes, no wkns, changes in mental status. All other systems reviewed and are otherwise negative except as noted above.  Physical Exam  Blood pressure 163/78, pulse 79, temperature 98.4 F (36.9 C), temperature source Oral, resp. rate 16, height 5\' 11"  (1.803 m), weight 333 lb 9.6 oz (151.32 kg), SpO2 98.00%.  General: Pleasant, NAD Psych: Normal affect. Neuro: Alert and oriented X 3. Moves all extremities spontaneously. HEENT: Normal  Neck: Supple, obese, difficult to assess jvp.  Soft R bruit, loud L bruit ->2/2 avf in left forearm. Lungs:  Resp regular and unlabored, CTA. Heart: RRR no s3, s4, soft systolic murmur @ USB. Abdomen: Soft, non-tender, non-distended, BS + x 4.  Extremities: No clubbing, cyanosis.  Trace bilat LE edema. DP/PT/Radials 1+ and equal bilaterally.  Labs   Recent Labs  05/28/13 0019 05/28/13 0152 05/28/13 0750  TROPONINI 0.34* 0.39* 0.37*   Lab Results  Component Value Date   WBC 6.6 05/28/2013   HGB 10.5* 05/28/2013   HCT 30.9* 05/28/2013   MCV 87.8 05/28/2013   PLT 184 05/28/2013     Recent Labs Lab 05/28/13 0505  NA 139  K 4.9  CL 98  CO2 20  BUN 87*  CREATININE 15.87*  CALCIUM 9.1  PROT 6.8  BILITOT 0.2*  ALKPHOS 86  ALT 10  AST 14  GLUCOSE 82   Radiology/Studies  Dg Chest Portable 1 View  05/27/2013   *RADIOLOGY REPORT*  Clinical Data: Cough, shortness of breath and chest  pain. Tightness in the chest and left arm.  PORTABLE CHEST - 1 VIEW    IMPRESSION: 1.  Mild cardiomegaly with mild pulmonary venous congestion, but no frank pulmonary edema at this time. 2.  Atherosclerosis.   Original Report Authenticated By: Trudie Reed, M.D.   ECG  RSR, 85, 1st deg avb, inf q's, poor R prog, no acute st/t  changes.  ASSESSMENT AND PLAN  1.  NSTEMI:  Pt presented following a 1 hr episode of chest pain.  He has mild elevation of troponin in the setting of ESRD and creat of 15 (missed dialysis on Monday).  He has multiple risk factors for CAD including poorly controlled htn, esrd, obesity, and tob abuse.  We will plan on diagnostic cath this AM.  Cont asa, bb.  Add high potency statin.  Dialysis per nephrology today.  2.  ESRD:  Per renal.  3.  HTN:  BP elevated but stable.  Cont bb.  Suspect noncompliance in light of homelessness over the weekend.  Follow and adjust as necessary.  4.  HL:  Check lipids/lft's.  Adding statin.  5.  Polysubstance Abuse:  Complete cessation advised.    6.  Homelessness:  Since Friday.  Plans to go to [facility] shelter after d/c.  Will ask SW to assist.  7.  Anemia:  Stable.   Signed, Nicolasa Ducking, NP 05/28/2013, 10:33 AM   Patient examined chart reviewed.  Given history of reduced EF.  Labile BP during dialysis, abnormal ECG and difficulties with non invasive imaging at his size favor diagnostic cath.  Risks discssed willing to proceed today.    Charlton Haws

## 2013-05-28 NOTE — Progress Notes (Signed)
CSW received referral due to the pt "ETOH". CSW will meet with the pt to offer community resource list for AA/NA support groups and possible SBIRT test.   Sherald Barge, LCSW-A Clinical Social Worker [phone]

## 2013-05-28 NOTE — Progress Notes (Signed)
ANTICOAGULATION CONSULT NOTE - Follow Up Consult  Pharmacy Consult for Heparin Indication: chest pain/ACS  No Known Allergies  Patient Measurements: Height: 5\' 11"  (180.3 cm) Weight: 333 lb 9.6 oz (151.32 kg) IBW/kg (Calculated) : 75.3  Vital Signs: Temp: 98.4 F (36.9 C) (06/04 0745) Temp src: Oral (06/04 0745) BP: 163/78 mmHg (06/04 0745) Pulse Rate: 79 (06/04 0745)    Recent Labs  05/27/13 2129 05/28/13 0019 05/28/13 0152 05/28/13 0505 05/28/13 0750 05/28/13 0815  HGB 11.4*  --   --  10.5*  --   --   HCT 32.6*  --   --  30.9*  --   --   PLT 194  --   --  184  --   --   HEPARINUNFRC  --   --   --   --   --  0.36  CREATININE 15.30*  --   --  15.87*  --   --   TROPONINI  --  0.34* 0.39*  --  0.37*  --     Estimated Creatinine Clearance: 8.1 ml/min (by C-G formula based on Cr of 15.87).  Assessment: 52 yo male with positive cardiac enzymes being continued on IV heparin for ACS/STEMI. First heparin level therapeutic at 0.36 at current rate of heparin 1700 units/hr.  Hgb 10.5, Plts 184. No bleeding noted in chart.   Goal of Therapy:  Heparin level 0.3-0.7 units/ml Monitor platelets by anticoagulation protocol: Yes   Plan:  1. Continue IV heparin at 1700 units/hr 2. Confirm with 8 hour heparin level  3. Monitor signs/symptoms of bleeding & f/u post cath   Benjaman Pott, PharmD, BCPS 05/28/2013   10:52 AM

## 2013-05-28 NOTE — ED Notes (Signed)
Patient stated he was at [facility] and started with chest pain.  Denies, N/V or diaphoresis but + SOB Patient stated that this has happened before but did not hurt as bad.

## 2013-05-28 NOTE — Progress Notes (Signed)
Patient seen and examined. Admitted earlier today for chest pain. Patient admits to using cocaine in the past couple days. He also has end-stage renal disease on dialysis Monday Wednesday and Friday and missed his dialysis session on Monday. Nephrology has been consulted for dialysis. I have also consulted cardiology as he has positive troponins. Will likely need further cardiac testing this admission. We'll continue to follow.  Peggye Pitt, MD [facility] Pager: [phone]

## 2013-05-28 NOTE — Progress Notes (Signed)
ANTICOAGULATION CONSULT NOTE - Initial Consult  Pharmacy Consult for Heparin Indication: chest pain/ACS  No Known Allergies  Patient Measurements: Height: 5\' 11"  (180.3 cm) Weight: 333 lb 9.6 oz (151.32 kg) IBW/kg (Calculated) : 75.3 Heparin Dosing Weight: 110 kg  Vital Signs: Temp: 97.4 F (36.3 C) (06/04 0150) Temp src: Axillary (06/04 0150) BP: 169/88 mmHg (06/04 0150) Pulse Rate: 81 (06/04 0150)  Labs:  Recent Labs  05/27/13 2129 05/28/13 0019  HGB 11.4*  --   HCT 32.6*  --   PLT 194  --   CREATININE 15.30*  --   TROPONINI  --  0.34*    Estimated Creatinine Clearance: 8.4 ml/min (by C-G formula based on Cr of 15.3).   Medical History: Past Medical History  Diagnosis Date  . Hypertension   . Chronic kidney disease     ESRD  . Arthritis     Medications:  Prescriptions prior to admission  Medication Sig Dispense Refill  . calcium acetate (PHOSLO) 667 MG capsule Take 2,668 mg by mouth 3 (three) times daily with meals.      . gabapentin (NEURONTIN) 100 MG capsule Take 100 mg by mouth at bedtime as needed (nerve pain).       Marland Kitchen labetalol (NORMODYNE) 300 MG tablet Take 300 mg by mouth 2 (two) times daily. Take 300 mg twice a day on non dialysis days.        Assessment: 52 yo male with chest pain, elevated cardiac markers, for heparin  Goal of Therapy:  Heparin level 0.3-0.7 units/ml Monitor platelets by anticoagulation protocol: Yes   Plan:  Heparin 4000 units Iv bolus, then 1700 units/hr Check heparin level in 8 hours.  Melquan Ernsberger, Gary Fleet 05/28/2013,1:56 AM

## 2013-05-28 NOTE — Consult Note (Signed)
[name] [name] 05/28/2013 [name] [name] D Requesting Physician:  Dr Sheral Apley  Reason for Consult:  ESRD pt with NSTEMI, missed last HD HPI: The patient is a 52 y.o. year-old with hx HTN, ESRD, substance abuse admitted with chest pain. Pt presented to ED last night with CP around 6pm.  Troponins are elevated slightly, EKG no acute changes.  Multiple risk factors for CAD and cardiology recommending heart cath.    ROS  pt homeless since Friday  +leg swelling and mild SOB  + CP, resolved  no n/v/d   Past Medical History:  Past Medical History  Diagnosis Date  . Hypertension   . End stage renal disease     a. MWF dialysis  . Arthritis   . Polysubstance abuse     a. tobacco and cocaine  . Chest pain     a. negative MV in 2004  . Abnormal ECG   . LVH (left ventricular hypertrophy)     a. 01/2013 Echo: EF 60-65%, sev concentric LVH, nl wall motion, sev dil LA.    Past Surgical History:  Past Surgical History  Procedure Laterality Date  . Av fistula placement      Left x 2  . Cholecystectomy    . Total knee arthroplasty  09/20/2012    Procedure: TOTAL KNEE ARTHROPLASTY;  Surgeon: [name], MD;  Location: [facility] OR;  Service: Orthopedics;  Laterality: Right;  right total knee arthroplasty  . Joint replacement Right 09/20/12    Family History:  Family History  Problem Relation Age of Onset  . Kidney failure Brother   . Hypertension Brother   . Kidney failure Brother   . Hypertension Mother   . Diabetes Mother   . Hypertension Father   . Hypertension Sister    Social History:  reports that he has been smoking Cigarettes.  He has a 9.3 pack-year smoking history. He has never used smokeless tobacco. He reports that he uses illicit drugs (Cocaine). He reports that he does not drink alcohol.  Allergies: No Known Allergies  Home medications: Prior to Admission medications   Medication Sig Start Date End Date Taking? Authorizing Provider  calcium acetate (PHOSLO) 667 MG  capsule Take 2,668 mg by mouth 3 (three) times daily with meals.   Yes Historical Provider, MD  gabapentin (NEURONTIN) 100 MG capsule Take 100 mg by mouth at bedtime as needed (nerve pain).    Yes Historical Provider, MD  labetalol (NORMODYNE) 300 MG tablet Take 300 mg by mouth 2 (two) times daily. Take 300 mg twice a day on non dialysis days. 02/04/13  Yes Rollene Rotunda, MD    Labs: Liver Function Tests:  Recent Labs Lab 05/28/13 0505  AST 14  ALT 10  ALKPHOS 86  BILITOT 0.2*  PROT 6.8  ALBUMIN 3.2*   No results found for this basename: LIPASE, AMYLASE,  in the last 168 hours PT/INR: @LABRCNTIP (inr:5) Cardiac Enzymes: ) Recent Labs Lab 05/28/13 0019 05/28/13 0152 05/28/13 0750  TROPONINI 0.34* 0.39* 0.37*   CBG:  Recent Labs Lab 05/28/13 0154 05/28/13 0742  GLUCAP 92 79   CBC  Recent Labs Lab 05/27/13 2129 05/28/13 0505  WBC 7.0 6.6  NEUTROABS  --  4.3  HGB 11.4* 10.5*  HCT 32.6* 30.9*  MCV 88.1 87.8  PLT 194 184   Basic Metabolic Panel:  Recent Labs Lab 05/27/13 2129 05/28/13 0505  NA 139 139  K 4.8 4.9  CL 96 98  CO2 21 20  GLUCOSE 98 82  BUN 83* 87*  CREATININE 15.30* 15.87*  CALCIUM 9.7 9.1    Physical Exam:  Blood pressure 163/78, pulse 79, temperature 98.4 F (36.9 C), temperature source Oral, resp. rate 16, height 5\' 11"  (1.803 m), weight 151.32 kg (333 lb 9.6 oz), SpO2 98.00%. Gen: alert, sitting at side of bed, no distress HEENT:  EOMI, sclera anicteric, throat clear Neck: no JVD, no LAN Chest: clear to bases bilat CV: regular, no rub or gallop, no carotid or femoral bruits, pedal pulses Abdomen: soft, obese nontender Ext: 1+ bilat pretibial edema, no joint effusion or deformity, no gangrene or ulceration Neuro: alert, Ox3, no focal deficit Access: LUA AVF +bruit  Outpatient HD: MWF [facility]   4.5hrs  500/800   145kg  2K/2CA [facility]   AVF LUA   Heparin 9000 bolus, 4000 mid   Hect 8ug   Impression/Plan 1. Chest pain /  +troponins- for heart cath per cardiology 2. ESRD, missed HD Monday. Plan HD today 3. HTN/volume- up 6kg, UF 4-5kg with HD. Cont labetalol  4. Anemia of CKD- Hb good, not on EPO at center 5. Secondary HPTH- cont vit D, phoslo 6. Cocaine use   Vinson Moselle  MD [facility] [phone] pgr    [phone] cell 05/28/2013, 11:02 AM

## 2013-05-28 NOTE — ED Notes (Signed)
Dr. Kirtland Bouchard called regarding troponin of 0.34  Stated he is coming to see the patient

## 2013-05-28 NOTE — Interval H&P Note (Signed)
    History and Physical Interval Note:  05/28/2013 1:36 PM  [name] [name]  has presented today for surgery, with the diagnosis of cp  The various methods of treatment have been discussed with the patient and family. After consideration of risks, benefits and other options for treatment, the patient has consented to  Procedure(s): LEFT HEART CATHETERIZATION WITH CORONARY ANGIOGRAM (N/A) as a surgical intervention .  The patient's history has been reviewed, patient examined, no change in status, stable for surgery.  I have reviewed the patient's chart and labs.  Questions were answered to the patient's satisfaction.     Lorine Bears

## 2013-05-28 NOTE — Progress Notes (Signed)
The patient was brought down to cath lab. After placing him on cath table, he reported inability to be flat on his back due to orthopnea even with using a wedge. Due to that I recommend postponing cardiac cath until after dialysis and fluid removal.   Will add him to cath schedule for tomorrow morning.

## 2013-05-28 NOTE — H&P (View-Only) (Signed)
Hypoglycemic Event  CBG: 47  Treatment: D50 IV 25 mL  Symptoms: None  Follow-up CBG: Time:1245 CBG Result:86  Possible Reasons for Event: Inadequate meal intake  Comments/MD notified:    Carlyon Prows  Remember to initiate Hypoglycemia Order Set & complete

## 2013-05-29 ENCOUNTER — Encounter (HOSPITAL_COMMUNITY)
Admission: EM | Disposition: A | Discharge status: Home / Self Care | Payer: Self-pay | Source: Home / Self Care | Attending: Internal Medicine

## 2013-05-29 DIAGNOSIS — I2510 Atherosclerotic heart disease of native coronary artery without angina pectoris: Secondary | ICD-10-CM

## 2013-05-29 DIAGNOSIS — F1910 Other psychoactive substance abuse, uncomplicated: Secondary | ICD-10-CM

## 2013-05-29 DIAGNOSIS — I10 Essential (primary) hypertension: Secondary | ICD-10-CM

## 2013-05-29 DIAGNOSIS — R079 Chest pain, unspecified: Secondary | ICD-10-CM

## 2013-05-29 DIAGNOSIS — N186 End stage renal disease: Secondary | ICD-10-CM

## 2013-05-29 HISTORY — PX: LEFT HEART CATHETERIZATION WITH CORONARY ANGIOGRAM: SHX5451

## 2013-05-29 LAB — BASIC METABOLIC PANEL
Calcium: 9.4 mg/dL (ref 8.4–10.5)
Creatinine, Ser: 10.38 mg/dL — ABNORMAL HIGH (ref 0.50–1.35)
GFR calc non Af Amer: 5 mL/min — ABNORMAL LOW (ref >90)
Sodium: 138 mEq/L (ref 135–145)

## 2013-05-29 LAB — CBC
HCT: 32.2 % — ABNORMAL LOW (ref 39.0–52.0)
Hemoglobin: 11 g/dL — ABNORMAL LOW (ref 13.0–17.0)
MCH: 30.2 pg (ref 26.0–34.0)
MCHC: 34.2 g/dL (ref 30.0–36.0)
MCV: 88.5 fL (ref 78.0–100.0)
RDW: 14.1 % (ref 11.5–15.5)

## 2013-05-29 LAB — GLUCOSE, CAPILLARY
Glucose-Capillary: 86 mg/dL (ref 70–99)
Glucose-Capillary: 78 mg/dL (ref 70–99)
Glucose-Capillary: 150 mg/dL — ABNORMAL HIGH (ref 70–99)

## 2013-05-29 LAB — LIPID PANEL
Cholesterol: 159 mg/dL (ref 0–200)
Triglycerides: 101 mg/dL (ref <150)

## 2013-05-29 LAB — HEPARIN LEVEL (UNFRACTIONATED): Heparin Unfractionated: 0.18 IU/mL — ABNORMAL LOW (ref 0.30–0.70)

## 2013-05-29 SURGERY — LEFT HEART CATHETERIZATION WITH CORONARY ANGIOGRAM
Anesthesia: LOCAL

## 2013-05-29 MED ORDER — SODIUM CHLORIDE 0.9 % IJ SOLN: 3.0000 mL | Freq: Two times a day (BID) | INTRAMUSCULAR | Status: DC

## 2013-05-29 MED ORDER — SODIUM CHLORIDE 0.9 % IV SOLN: 250.0000 mL | INTRAVENOUS | Status: DC | PRN

## 2013-05-29 MED ORDER — HEPARIN BOLUS VIA INFUSION
2000.0000 [IU] | Freq: Once | INTRAVENOUS | Status: AC
  Administered 2013-05-29: 2000 [IU] via INTRAVENOUS
  Filled 2013-05-29: qty 2000

## 2013-05-29 MED ORDER — MIDAZOLAM HCL 2 MG/2ML IJ SOLN
INTRAMUSCULAR | Status: AC
  Filled 2013-05-29: qty 2

## 2013-05-29 MED ORDER — AMLODIPINE BESYLATE 5 MG PO TABS
5.0000 mg | ORAL_TABLET | Freq: Every day | ORAL | Status: DC
  Administered 2013-05-29 – 2013-05-30 (×2): 5 mg via ORAL
  Filled 2013-05-29 (×2): qty 1

## 2013-05-29 MED ORDER — SODIUM CHLORIDE 0.9 % IJ SOLN: 3.0000 mL | INTRAMUSCULAR | Status: DC | PRN

## 2013-05-29 MED ORDER — HEPARIN (PORCINE) IN NACL 2-0.9 UNIT/ML-% IJ SOLN
INTRAMUSCULAR | Status: AC
  Filled 2013-05-29: qty 1000

## 2013-05-29 MED ORDER — ASPIRIN 81 MG PO CHEW
324.0000 mg | CHEWABLE_TABLET | ORAL | Status: AC
  Administered 2013-05-29: 324 mg via ORAL
  Filled 2013-05-29: qty 4

## 2013-05-29 MED ORDER — FENTANYL CITRATE 0.05 MG/ML IJ SOLN
INTRAMUSCULAR | Status: AC
  Filled 2013-05-29: qty 2

## 2013-05-29 MED ORDER — SODIUM CHLORIDE 0.9 % IV SOLN
INTRAVENOUS | Status: DC
  Administered 2013-05-29: 06:00:00 via INTRAVENOUS

## 2013-05-29 MED ORDER — LIDOCAINE HCL (PF) 1 % IJ SOLN
INTRAMUSCULAR | Status: AC
  Filled 2013-05-29: qty 30

## 2013-05-29 NOTE — Progress Notes (Signed)
Subjective: feels better, 4.7kg off with HD yest   Objective Filed Vitals:   05/28/13 1932 05/28/13 2001 05/29/13 0424 05/29/13 0500  BP: 148/89 155/88 168/112   Pulse: 81 78 76   Temp: 98.1 F (36.7 C) 97.9 F (36.6 C) 97.4 F (36.3 C)   TempSrc: Oral Oral Oral   Resp: 22 20 20    Height:      Weight: 147.3 kg (324 lb 11.8 oz)   147.6 kg (325 lb 6.4 oz)  SpO2: 100% 94% 97%     CBC:  Recent Labs Lab 05/27/13 2129 05/28/13 0505 05/29/13 0600  WBC 7.0 6.6 5.9  NEUTROABS  --  4.3  --   HGB 11.4* 10.5* 11.0*  HCT 32.6* 30.9* 32.2*  MCV 88.1 87.8 88.5  PLT 194 184 184   Basic Metabolic Panel:  Recent Labs Lab 05/27/13 2129 05/28/13 0505 05/29/13 0600  NA 139 139 138  K 4.8 4.9 4.9  CL 96 98 100  CO2 21 20 25   GLUCOSE 98 82 77  BUN 83* 87* 44*  CREATININE 15.30* 15.87* 10.38*  CALCIUM 9.7 9.1 9.4    Physical Exam:  Blood pressure 168/112, pulse 76, temperature 97.4 F (36.3 C), temperature source Oral, resp. rate 20, height 5\' 11"  (1.803 m), weight 147.6 kg (325 lb 6.4 oz), SpO2 97.00%.  Gen: alert, sitting at side of bed, no distress  HEENT: EOMI, sclera anicteric, throat clear  Neck: no JVD, no LAN  Chest: clear to bases bilat  CV: regular, no rub or gallop, no carotid or femoral bruits, pedal pulses  Abdomen: soft, obese nontender  Ext: 1+ bilat pretibial edema, no joint effusion or deformity, no gangrene or ulceration  Neuro: alert, Ox3, no focal deficit  Access: LUA AVF +bruit   Outpatient HD: MWF [facility] 4.5hrs 500/800 145kg 2K/2CA [place] AVF LUA Heparin 9000 bolus, 4000 mid Hect 8ug   Impression/Plan  1. Chest pain / +troponins- for heart cath most likely today per cardiology 2. ESRD, cont MWF HD. HD Friday first shift 3. HTN/volume- 4.7kg off yest, vol status better, only 2kg up today. HD in am.  Cont labetalol  4. Anemia of CKD- Hb good, not on EPO at center 5. Secondary HPTH- cont vit D, phoslo 6. Cocaine use   Vinson Moselle  MD [phone] pgr     [phone] cell 05/29/2013, 12:53 PM

## 2013-05-29 NOTE — CV Procedure (Signed)
   Cardiac Catheterization Procedure Note  Name: [name] [name] MRN: [id number] DOB: 01/01/1961  Procedure: Left Heart Cath, Selective Coronary Angiography, LV angiography  Indication: 52 year old gentleman with end-stage renal disease. He has been living with a friend but was apparently kicked out of the house last week and has been homeless. He relapsed with cocaine use. He missed dialysis earlier in the week. He presented with chest pain and positive troponin. He was referred for cardiac catheterization and possible PCI.  Procedural details: The right groin was prepped, draped, and anesthetized with 1% lidocaine. Using modified Seldinger technique, a 5 French sheath was introduced into the right femoral artery. Standard Judkins catheters were used for coronary angiography and left ventriculography. Catheter exchanges were performed over a guidewire. Because of iliac tortuosity, there was difficulty torquing catheters. I changed a 5 French sheath out to a long 23 cm 6 [place] sheath. We used an AL-2 catheter to engage the right coronary artery which had a high anterior origin. There were no immediate procedural complications. The patient was transferred to the post catheterization recovery area for further monitoring.  Procedural Findings: Hemodynamics:  AO 147/97 LV 148/29   Coronary angiography: Coronary dominance: right  Left mainstem: The left main coronary artery is large. There is no stenosis.  Left anterior descending (LAD): The LAD is very large in caliber. There is minor irregularity but no significant stenosis throughout. The vessel is difficult to fill with contrast because of its large caliber. There is a large first diagonal with a 80% stenosis in its midportion.  Left circumflex (LCx): The circumflex is large in caliber. The vessel is widely patent. There were no stenoses throughout.  Right coronary artery (RCA): This has a high, anterior origin. The vessel is dominant.  There is diffuse irregularity but no significant stenosis throughout.  Left ventriculography: Left ventricular systolic function is normal, LVEF is estimated at 55-65%, the ventricle was difficult to visualize.  Final Conclusions:   1. Severe diagonal branch stenosis 2. Patent LAD, left circumflex, and right coronary arteries 3. Normal LV function  Recommendations: Considering the patient's social circumstances, noncompliance, and polysubstance abuse, I think stenting the diagonal is a bad idea. This is single vessel disease involving a branch vessel of the LAD in the setting of normal LV function he should respond well to medical therapy. I would not recommend revascularization at this time.  Tonny Bollman 05/29/2013, 5:44 PM

## 2013-05-29 NOTE — Progress Notes (Signed)
 SUBJECTIVE:  He is lying flat back.  No SOB.  He denies SOB   PHYSICAL EXAM Filed Vitals:   05/28/13 1932 05/28/13 2001 05/29/13 0424 05/29/13 0500  BP: 148/89 155/88 168/112   Pulse: 81 78 76   Temp: 98.1 F (36.7 C) 97.9 F (36.6 C) 97.4 F (36.3 C)   TempSrc: Oral Oral Oral   Resp: 22 20 20   Height:      Weight: 324 lb 11.8 oz (147.3 kg)   325 lb 6.4 oz (147.6 kg)  SpO2: 100% 94% 97%    General:  No distress Lungs:  Clear Heart:  RRR Abdomen:  Positive bowel sounds, no rebound no guarding Extremities:  No edema  LABS:  Results for orders placed during the hospital encounter of 05/27/13 (from the past 24 hour(s))  GLUCOSE, CAPILLARY     Status: Abnormal   Collection Time    05/28/13 11:42 AM      Result Value Range   Glucose-Capillary 47 (*) 70 - 99 mg/dL  GLUCOSE, CAPILLARY     Status: None   Collection Time    05/28/13 12:46 PM      Result Value Range   Glucose-Capillary 86  70 - 99 mg/dL  TROPONIN I     Status: Abnormal   Collection Time    05/28/13  2:32 PM      Result Value Range   Troponin I 0.33 (*) <0.30 ng/mL  PROTIME-INR     Status: None   Collection Time    05/28/13  2:32 PM      Result Value Range   Prothrombin Time 13.9  11.6 - 15.2 seconds   INR 1.08  0.00 - 1.49  HEPARIN LEVEL (UNFRACTIONATED)     Status: Abnormal   Collection Time    05/28/13  3:11 PM      Result Value Range   Heparin Unfractionated <0.10 (*) 0.30 - 0.70 IU/mL  GLUCOSE, CAPILLARY     Status: Abnormal   Collection Time    05/28/13  8:01 PM      Result Value Range   Glucose-Capillary 66 (*) 70 - 99 mg/dL  GLUCOSE, CAPILLARY     Status: None   Collection Time    05/28/13  8:45 PM      Result Value Range   Glucose-Capillary 80  70 - 99 mg/dL  GLUCOSE, CAPILLARY     Status: Abnormal   Collection Time    05/29/13 12:02 AM      Result Value Range   Glucose-Capillary 110 (*) 70 - 99 mg/dL   Comment 1 Notify RN    GLUCOSE, CAPILLARY     Status: None   Collection Time     05/29/13  4:16 AM      Result Value Range   Glucose-Capillary 86  70 - 99 mg/dL  GLUCOSE, CAPILLARY     Status: None   Collection Time    05/29/13  5:54 AM      Result Value Range   Glucose-Capillary 78  70 - 99 mg/dL  LIPID PANEL     Status: Abnormal   Collection Time    05/29/13  6:00 AM      Result Value Range   Cholesterol 159  0 - 200 mg/dL   Triglycerides 101  <150 mg/dL   HDL 32 (*) >39 mg/dL   Total CHOL/HDL Ratio 5.0     VLDL 20  0 - 40 mg/dL   LDL Cholesterol 107 (*)   0 - 99 mg/dL  HEPARIN LEVEL (UNFRACTIONATED)     Status: Abnormal   Collection Time    05/29/13  6:00 AM      Result Value Range   Heparin Unfractionated 0.18 (*) 0.30 - 0.70 IU/mL  CBC     Status: Abnormal   Collection Time    05/29/13  6:00 AM      Result Value Range   WBC 5.9  4.0 - 10.5 K/uL   RBC 3.64 (*) 4.22 - 5.81 MIL/uL   Hemoglobin 11.0 (*) 13.0 - 17.0 g/dL   HCT 32.2 (*) 39.0 - 52.0 %   MCV 88.5  78.0 - 100.0 fL   MCH 30.2  26.0 - 34.0 pg   MCHC 34.2  30.0 - 36.0 g/dL   RDW 14.1  11.5 - 15.5 %   Platelets 184  150 - 400 K/uL  BASIC METABOLIC PANEL     Status: Abnormal   Collection Time    05/29/13  6:00 AM      Result Value Range   Sodium 138  135 - 145 mEq/L   Potassium 4.9  3.5 - 5.1 mEq/L   Chloride 100  96 - 112 mEq/L   CO2 25  19 - 32 mEq/L   Glucose, Bld 77  70 - 99 mg/dL   BUN 44 (*) 6 - 23 mg/dL   Creatinine, Ser 10.38 (*) 0.50 - 1.35 mg/dL   Calcium 9.4  8.4 - 10.5 mg/dL   GFR calc non Af Amer 5 (*) >90 mL/min   GFR calc Af Amer 6 (*) >90 mL/min  GLUCOSE, CAPILLARY     Status: None   Collection Time    05/29/13  7:32 AM      Result Value Range   Glucose-Capillary 83  70 - 99 mg/dL    Intake/Output Summary (Last 24 hours) at 05/29/13 0900 Last data filed at 05/28/13 1932  Gross per 24 hour  Intake      0 ml  Output   4700 ml  Net  -4700 ml    ASSESSMENT AND PLAN:  NSTEMI:  Cath today.  Discussed with the patient.  He does not think that he will have any  trouble lying flat back.  HTN:  BP elevated.  Plan per primary team.   HYPERLIPIDEMIA:   Now on a statin.    Anaiya Wisinski 05/29/2013 9:00 AM   

## 2013-05-29 NOTE — Progress Notes (Signed)
ANTICOAGULATION CONSULT NOTE  Pharmacy Consult for Heparin Indication: chest pain/ACS  No Known Allergies  Patient Measurements: Height: 5\' 11"  (180.3 cm) Weight: 325 lb 6.4 oz (147.6 kg) IBW/kg (Calculated) : 75.3 Heparin Dosing Weight: 110 kg  Vital Signs: Temp: 97.4 F (36.3 C) (06/05 0424) Temp src: Oral (06/05 0424) BP: 168/112 mmHg (06/05 0424) Pulse Rate: 76 (06/05 0424)  Labs:  Recent Labs  05/27/13 2129  05/28/13 0152 05/28/13 0505 05/28/13 0750 05/28/13 0815 05/28/13 1432 05/28/13 1511 05/29/13 0600  HGB 11.4*  --   --  10.5*  --   --   --   --  11.0*  HCT 32.6*  --   --  30.9*  --   --   --   --  32.2*  PLT 194  --   --  184  --   --   --   --  184  LABPROT  --   --   --   --   --   --  13.9  --   --   INR  --   --   --   --   --   --  1.08  --   --   HEPARINUNFRC  --   --   --   --   --  0.36  --  <0.10* 0.18*  CREATININE 15.30*  --   --  15.87*  --   --   --   --   --   TROPONINI  --   < > 0.39*  --  0.37*  --  0.33*  --   --   < > = values in this interval not displayed.  Estimated Creatinine Clearance: 8 ml/min (by C-G formula based on Cr of 15.87).  Assessment: 52 yo male with chest pain, elevated cardiac markers, for heparin.    Goal of Therapy:  Heparin level 0.3-0.7 units/ml Monitor platelets by anticoagulation protocol: Yes   Plan:  Heparin 2000 units IV bolus, then increase heparin 2000 units/hr F/U after cath today   [name] [name] 05/29/2013,7:22 AM

## 2013-05-29 NOTE — Progress Notes (Signed)
Utilization review completed.  

## 2013-05-29 NOTE — Interval H&P Note (Signed)
History and Physical Interval Note:  05/29/2013 5:07 PM  [name] [name]  has presented today for surgery, with the diagnosis of cp  The various methods of treatment have been discussed with the patient and family. After consideration of risks, benefits and other options for treatment, the patient has consented to  Procedure(s): LEFT HEART CATHETERIZATION WITH CORONARY ANGIOGRAM (N/A) as a surgical intervention .  The patient's history has been reviewed, patient examined, no change in status, stable for surgery.  I have reviewed the patient's chart and labs.  Questions were answered to the patient's satisfaction.     Tonny Bollman

## 2013-05-29 NOTE — Progress Notes (Signed)
[facility] PROGRESS NOTE  [name] [name] ZOX:[id number] DOB: Jul 20, 1961 DOA: 05/27/2013 PCP: No primary provider on file.  Assessment/Plan: Non-ST elevated MI -4 cath today. Appreciate cardiology consultation. -Continue aspirin and heparin drip.  End-stage renal disease -On hemodialysis Monday Wednesday Friday. -As per renal.  Cocaine abuse -Cessation counseling provided. -Social work to provide [employer].  Hypertension -BP remains elevated. -Continue labetalol. -Add norvasc 5 mg and adjust as needed.  Code Status: Full code Family Communication: Patient only  Disposition Plan: Cardiac cath today   Consultants:  Cardiology, Dr. Antoine Poche   Antibiotics:  None   Subjective: Grumpy, no complaints, lying down flat without dyspnea.  Objective: Filed Vitals:   05/28/13 1932 05/28/13 2001 05/29/13 0424 05/29/13 0500  BP: 148/89 155/88 168/112   Pulse: 81 78 76   Temp: 98.1 F (36.7 C) 97.9 F (36.6 C) 97.4 F (36.3 C)   TempSrc: Oral Oral Oral   Resp: 22 20 20    Height:      Weight: 147.3 kg (324 lb 11.8 oz)   147.6 kg (325 lb 6.4 oz)  SpO2: 100% 94% 97%     Intake/Output Summary (Last 24 hours) at 05/29/13 1058 Last data filed at 05/28/13 1932  Gross per 24 hour  Intake      0 ml  Output   4700 ml  Net  -4700 ml   Filed Weights   05/28/13 1443 05/28/13 1932 05/29/13 0500  Weight: 151.1 kg (333 lb 1.8 oz) 147.3 kg (324 lb 11.8 oz) 147.6 kg (325 lb 6.4 oz)    Exam:   General:  Alert, awake, no distress.  Cardiovascular: Regular rate and rhythm  Respiratory: Clear to auscultation bilaterally  Abdomen: Obese, soft, nontender, nondistended, positive bowel sounds  Extremities: 1-2+ pitting edema bilaterally   Neurologic:  Grossly intact and nonfocal  Data Reviewed: Basic Metabolic Panel:  Recent Labs Lab 05/27/13 2129 05/28/13 0505 05/29/13 0600  NA 139 139 138  K 4.8 4.9 4.9  CL 96 98 100  CO2 21 20 25   GLUCOSE 98 82  77  BUN 83* 87* 44*  CREATININE 15.30* 15.87* 10.38*  CALCIUM 9.7 9.1 9.4   Liver Function Tests:  Recent Labs Lab 05/28/13 0505  AST 14  ALT 10  ALKPHOS 86  BILITOT 0.2*  PROT 6.8  ALBUMIN 3.2*   No results found for this basename: LIPASE, AMYLASE,  in the last 168 hours No results found for this basename: AMMONIA,  in the last 168 hours CBC:  Recent Labs Lab 05/27/13 2129 05/28/13 0505 05/29/13 0600  WBC 7.0 6.6 5.9  NEUTROABS  --  4.3  --   HGB 11.4* 10.5* 11.0*  HCT 32.6* 30.9* 32.2*  MCV 88.1 87.8 88.5  PLT 194 184 184   Cardiac Enzymes:  Recent Labs Lab 05/28/13 0019 05/28/13 0152 05/28/13 0750 05/28/13 1432  TROPONINI 0.34* 0.39* 0.37* 0.33*   BNP (last 3 results)  Recent Labs  05/27/13 2129  PROBNP 4004.0*   CBG:  Recent Labs Lab 05/28/13 2045 05/29/13 0002 05/29/13 0416 05/29/13 0554 05/29/13 0732  GLUCAP 80 110* 86 78 83    No results found for this or any previous visit (from the past 240 hour(s)).   Studies: Dg Chest Portable 1 View  05/27/2013   *RADIOLOGY REPORT*  Clinical Data: Cough, shortness of breath and chest pain. Tightness in the chest and left arm.  PORTABLE CHEST - 1 VIEW  Comparison: Chest x-ray 09/02/2012.  Findings: Lung volumes are normal.  No acute consolidative airspace disease.  No pleural effusions.  Mild pulmonary venous congestion, without frank pulmonary edema.  Mild cardiomegaly.  Upper mediastinal contours are within normal limits.  Atherosclerosis in the thoracic aorta.  IMPRESSION: 1.  Mild cardiomegaly with mild pulmonary venous congestion, but no frank pulmonary edema at this time. 2.  Atherosclerosis.   Original Report Authenticated By: Trudie Reed, M.D.    Scheduled Meds: . aspirin EC  325 mg Oral Daily  . atorvastatin  80 mg Oral q1800  . calcium acetate  2,668 mg Oral TID WC  . labetalol  300 mg Oral Custom  . sodium chloride  3 mL Intravenous Q12H  . sodium chloride  3 mL Intravenous Q12H  .  sodium chloride  3 mL Intravenous Q12H   Continuous Infusions: . sodium chloride 10 mL/hr at 05/29/13 0555  . heparin 2,000 Units/hr (05/29/13 0747)    Principal Problem:   Chest pain Active Problems:   HTN (hypertension)   End stage renal disease   Polysubstance abuse    Time spent: 35 minutes    [name] [name],[name] [name]  [facility] Pager [phone]  If 7PM-7AM, please contact night-coverage at [email or web address], password [facility] 05/29/2013, 10:58 AM  LOS: 2 days

## 2013-05-29 NOTE — Progress Notes (Signed)
Hypoglycemic Event  CBG: 66  Treatment: 15 GM carbohydrate snack  Symptoms: headache  Follow-up CBG: Time:2045 CBG Result:80  Possible Reasons for Event: Inadequate meal intake  Comments/MD notified:pt stated he was hungry and just gotten back to the unit from dialysis.     Delene Loll  Remember to initiate Hypoglycemia Order Set & complete

## 2013-05-29 NOTE — H&P (View-Only) (Signed)
SUBJECTIVE:  He is lying flat back.  No SOB.  He denies SOB   PHYSICAL EXAM Filed Vitals:   05/28/13 1932 05/28/13 2001 05/29/13 0424 05/29/13 0500  BP: 148/89 155/88 168/112   Pulse: 81 78 76   Temp: 98.1 F (36.7 C) 97.9 F (36.6 C) 97.4 F (36.3 C)   TempSrc: Oral Oral Oral   Resp: 22 20 20    Height:      Weight: 324 lb 11.8 oz (147.3 kg)   325 lb 6.4 oz (147.6 kg)  SpO2: 100% 94% 97%    General:  No distress Lungs:  Clear Heart:  RRR Abdomen:  Positive bowel sounds, no rebound no guarding Extremities:  No edema  LABS:  Results for orders placed during the hospital encounter of 05/27/13 (from the past 24 hour(s))  GLUCOSE, CAPILLARY     Status: Abnormal   Collection Time    05/28/13 11:42 AM      Result Value Range   Glucose-Capillary 47 (*) 70 - 99 mg/dL  GLUCOSE, CAPILLARY     Status: None   Collection Time    05/28/13 12:46 PM      Result Value Range   Glucose-Capillary 86  70 - 99 mg/dL  TROPONIN I     Status: Abnormal   Collection Time    05/28/13  2:32 PM      Result Value Range   Troponin I 0.33 (*) <0.30 ng/mL  PROTIME-INR     Status: None   Collection Time    05/28/13  2:32 PM      Result Value Range   Prothrombin Time 13.9  11.6 - 15.2 seconds   INR 1.08  0.00 - 1.49  HEPARIN LEVEL (UNFRACTIONATED)     Status: Abnormal   Collection Time    05/28/13  3:11 PM      Result Value Range   Heparin Unfractionated <0.10 (*) 0.30 - 0.70 IU/mL  GLUCOSE, CAPILLARY     Status: Abnormal   Collection Time    05/28/13  8:01 PM      Result Value Range   Glucose-Capillary 66 (*) 70 - 99 mg/dL  GLUCOSE, CAPILLARY     Status: None   Collection Time    05/28/13  8:45 PM      Result Value Range   Glucose-Capillary 80  70 - 99 mg/dL  GLUCOSE, CAPILLARY     Status: Abnormal   Collection Time    05/29/13 12:02 AM      Result Value Range   Glucose-Capillary 110 (*) 70 - 99 mg/dL   Comment 1 Notify RN    GLUCOSE, CAPILLARY     Status: None   Collection Time     05/29/13  4:16 AM      Result Value Range   Glucose-Capillary 86  70 - 99 mg/dL  GLUCOSE, CAPILLARY     Status: None   Collection Time    05/29/13  5:54 AM      Result Value Range   Glucose-Capillary 78  70 - 99 mg/dL  LIPID PANEL     Status: Abnormal   Collection Time    05/29/13  6:00 AM      Result Value Range   Cholesterol 159  0 - 200 mg/dL   Triglycerides 161  <096 mg/dL   HDL 32 (*) >04 mg/dL   Total CHOL/HDL Ratio 5.0     VLDL 20  0 - 40 mg/dL   LDL Cholesterol 540 (*)  0 - 99 mg/dL  HEPARIN LEVEL (UNFRACTIONATED)     Status: Abnormal   Collection Time    05/29/13  6:00 AM      Result Value Range   Heparin Unfractionated 0.18 (*) 0.30 - 0.70 IU/mL  CBC     Status: Abnormal   Collection Time    05/29/13  6:00 AM      Result Value Range   WBC 5.9  4.0 - 10.5 K/uL   RBC 3.64 (*) 4.22 - 5.81 MIL/uL   Hemoglobin 11.0 (*) 13.0 - 17.0 g/dL   HCT 16.1 (*) 09.6 - 04.5 %   MCV 88.5  78.0 - 100.0 fL   MCH 30.2  26.0 - 34.0 pg   MCHC 34.2  30.0 - 36.0 g/dL   RDW 40.9  81.1 - 91.4 %   Platelets 184  150 - 400 K/uL  BASIC METABOLIC PANEL     Status: Abnormal   Collection Time    05/29/13  6:00 AM      Result Value Range   Sodium 138  135 - 145 mEq/L   Potassium 4.9  3.5 - 5.1 mEq/L   Chloride 100  96 - 112 mEq/L   CO2 25  19 - 32 mEq/L   Glucose, Bld 77  70 - 99 mg/dL   BUN 44 (*) 6 - 23 mg/dL   Creatinine, Ser 78.29 (*) 0.50 - 1.35 mg/dL   Calcium 9.4  8.4 - 56.2 mg/dL   GFR calc non Af Amer 5 (*) >90 mL/min   GFR calc Af Amer 6 (*) >90 mL/min  GLUCOSE, CAPILLARY     Status: None   Collection Time    05/29/13  7:32 AM      Result Value Range   Glucose-Capillary 83  70 - 99 mg/dL    Intake/Output Summary (Last 24 hours) at 05/29/13 0900 Last data filed at 05/28/13 1932  Gross per 24 hour  Intake      0 ml  Output   4700 ml  Net  -4700 ml    ASSESSMENT AND PLAN:  NSTEMI:  Cath today.  Discussed with the patient.  He does not think that he will have any  trouble lying flat back.  HTN:  BP elevated.  Plan per primary team.   HYPERLIPIDEMIA:   Now on a statin.    Fayrene Fearing Sallie Staron 05/29/2013 9:00 AM

## 2013-05-29 NOTE — Progress Notes (Signed)
CSW will meet with the pt today to give him community resources for [facility]. CSW attempted to meet with the pt yesterday at 4:15pm and the pt wasn't in his room.   Sherald Barge, LCSW-A Clinical Social Worker [phone]

## 2013-05-29 NOTE — Interval H&P Note (Signed)
History and Physical Interval Note:  05/29/2013 5:08 PM  [name] [name]  has presented today for surgery, with the diagnosis of cp  The various methods of treatment have been discussed with the patient and family. After consideration of risks, benefits and other options for treatment, the patient has consented to  Procedure(s): LEFT HEART CATHETERIZATION WITH CORONARY ANGIOGRAM (N/A) as a surgical intervention .  The patient's history has been reviewed, patient examined, no change in status, stable for surgery.  I have reviewed the patient's chart and labs.  Questions were answered to the patient's satisfaction.     Tonny Bollman

## 2013-05-30 LAB — CBC
Platelets: 170 10*3/uL (ref 150–400)
RDW: 14.3 % (ref 11.5–15.5)
WBC: 5.5 10*3/uL (ref 4.0–10.5)

## 2013-05-30 MED ORDER — SODIUM CHLORIDE 0.9 % IV SOLN: 100.0000 mL | INTRAVENOUS | Status: DC | PRN

## 2013-05-30 MED ORDER — ALTEPLASE 2 MG IJ SOLR: 2.0000 mg | Freq: Once | INTRAMUSCULAR | Status: DC | PRN

## 2013-05-30 MED ORDER — HEPARIN SODIUM (PORCINE) 1000 UNIT/ML DIALYSIS: 1000.0000 [IU] | INTRAMUSCULAR | Status: DC | PRN

## 2013-05-30 MED ORDER — HEART ATTACK BOUNCING BOOK
Freq: Once | Status: AC
  Administered 2013-05-30: 13:00:00
  Filled 2013-05-30: qty 1

## 2013-05-30 MED ORDER — NEPRO/CARBSTEADY PO LIQD: 237.0000 mL | ORAL | Status: DC | PRN

## 2013-05-30 MED ORDER — LIDOCAINE-PRILOCAINE 2.5-2.5 % EX CREA: 1.0000 "application " | TOPICAL_CREAM | CUTANEOUS | Status: DC | PRN

## 2013-05-30 MED ORDER — ATORVASTATIN CALCIUM 80 MG PO TABS: 80.0000 mg | ORAL_TABLET | Freq: Every day | ORAL | Status: DC

## 2013-05-30 MED ORDER — ASPIRIN EC 81 MG PO TBEC
81.0000 mg | DELAYED_RELEASE_TABLET | Freq: Every day | ORAL | Status: DC
  Administered 2013-05-30: 81 mg via ORAL
  Filled 2013-05-30: qty 1

## 2013-05-30 MED ORDER — ASPIRIN 81 MG PO TBEC: 81.0000 mg | DELAYED_RELEASE_TABLET | Freq: Every day | ORAL | Status: DC

## 2013-05-30 MED ORDER — AMLODIPINE BESYLATE 5 MG PO TABS: 5.0000 mg | ORAL_TABLET | Freq: Every day | ORAL | Status: DC

## 2013-05-30 MED ORDER — LIDOCAINE HCL (PF) 1 % IJ SOLN: 5.0000 mL | INTRAMUSCULAR | Status: DC | PRN

## 2013-05-30 MED ORDER — PENTAFLUOROPROP-TETRAFLUOROETH EX AERO: 1.0000 "application " | INHALATION_SPRAY | CUTANEOUS | Status: DC | PRN

## 2013-05-30 NOTE — Progress Notes (Addendum)
Subjective:  On hd ,no cos, "ready to go home" Objective Vital signs in last 24 hours: Filed Vitals:   05/30/13 0700 05/30/13 0730 05/30/13 0800 05/30/13 0900  BP: 149/95 137/95 151/78 150/95  Pulse: 72 70 75 69  Temp:      TempSrc:      Resp: 16 15 19 14   Height:      Weight:      SpO2:       Weight change: -4.4 kg (-9 lb 11.2 oz)  Intake/Output Summary (Last 24 hours) at 05/30/13 0927 Last data filed at 05/29/13 2149  Gross per 24 hour  Intake      3 ml  Output      0 ml  Net      3 ml   Labs: Basic Metabolic Panel:  Recent Labs Lab 05/27/13 2129 05/28/13 0505 05/29/13 0600  NA 139 139 138  K 4.8 4.9 4.9  CL 96 98 100  CO2 21 20 25   GLUCOSE 98 82 77  BUN 83* 87* 44*  CREATININE 15.30* 15.87* 10.38*  CALCIUM 9.7 9.1 9.4   Liver Function Tests:  Recent Labs Lab 05/28/13 0505  AST 14  ALT 10  ALKPHOS 86  BILITOT 0.2*  PROT 6.8  ALBUMIN 3.2*   No results found for this basename: LIPASE, AMYLASE,  in the last 168 hours No results found for this basename: AMMONIA,  in the last 168 hours CBC:  Recent Labs Lab 05/27/13 2129 05/28/13 0505 05/29/13 0600 05/30/13 0519  WBC 7.0 6.6 5.9 5.5  NEUTROABS  --  4.3  --   --   HGB 11.4* 10.5* 11.0* 10.5*  HCT 32.6* 30.9* 32.2* 31.4*  MCV 88.1 87.8 88.5 90.0  PLT 194 184 184 170   Cardiac Enzymes:  Recent Labs Lab 05/28/13 0019 05/28/13 0152 05/28/13 0750 05/28/13 1432  TROPONINI 0.34* 0.39* 0.37* 0.33*   CBG:  Recent Labs Lab 05/29/13 1134 05/29/13 1639 05/29/13 2039 05/30/13 0027 05/30/13 0428  GLUCAP 101* 80 150* 109* 90    Iron Studies: No results found for this basename: IRON, TIBC, TRANSFERRIN, FERRITIN,  in the last 72 hours Studies/Results: No results found. Medications:   . amLODipine  5 mg Oral Daily  . aspirin EC  81 mg Oral Daily  . atorvastatin  80 mg Oral q1800  . calcium acetate  2,668 mg Oral TID WC  . heart attack bouncing book   Does not apply Once  . labetalol  300  mg Oral Custom  . sodium chloride  3 mL Intravenous Q12H  . sodium chloride  3 mL Intravenous Q12H  . sodium chloride  3 mL Intravenous Q12H   I  have reviewed scheduled and prn medications.  Physical Exam: General: alert obese bm on hd , nad Heart: RRR, no mur, rub Lungs: CTA bilaterally Abdomen: Obese, bs pos. Soft, nontender Extremities: Dialysis Access: trace  pedal edema, LUA AVF patent on hd   Outpatient HD: MWF [facility] 4.5hrs 500/800 145kg 2K/2CA [place] AVF LUA Heparin 9000 bolus, 4000 mid Hect 8ug   Impression/Plan  1. Chest pain / +troponins-  per cardiology noted  Medical therapy for CAD found on  cath yesterday/ noted possible dc home today. Had 80% diagonal stenosis, will be treated medically per cardiology, poor candidate for PCI given hx of compliance problems 2. ESRD, cont MWF HD. HD today  FU wt Post hd ? Reducing edw 3. HTN/volume- 4.7kg offwed hd, vol status better,  bp stable  on hd today fu wt as above post hd  Cont labetalol  4. Anemia of CKD- Hb 10.5 not on EPO at center/  fu hgb at op hd per EPO protocal 5. Secondary HPTH- cont vit D, phoslo/ hectoral op  unit 6. Cocaine use= dw him risk of Sudden death and CAD/SW to Provide Assistance in program   Lenny Pastel, PA-C [facility] Beeper [phone] 05/30/2013,9:27 AM  LOS: 3 days   Patient seen and examined.  I agree with plan as above with additions as indicated. OK for discharge from renal standpoint.   Vinson Moselle  MD [phone] pgr    [phone] cell 05/30/2013, 10:59 AM

## 2013-05-30 NOTE — Progress Notes (Signed)
Patient Name: [name] [name]      SUBJECTIVE:on HD in no distress and without chest pain Able to recount result of cath>>IV cad med Rx  Past Medical History  Diagnosis Date  . Hypertension   . End stage renal disease     a. MWF dialysis  . Arthritis   . Polysubstance abuse     a. tobacco and cocaine  . Chest pain     a. negative MV in 2004  . Abnormal ECG   . LVH (left ventricular hypertrophy)     a. 01/2013 Echo: EF 60-65%, sev concentric LVH, nl wall motion, sev dil LA.    PHYSICAL EXAM Filed Vitals:   05/30/13 0648 05/30/13 0700 05/30/13 0730 05/30/13 0800  BP: 128/73 149/95 137/95 151/78  Pulse: 72 72 70 75  Temp:      TempSrc:      Resp: 17 16 15 19   Height:      Weight:      SpO2:        Well developed and nourished in no acute distress HENT normal Neck supple with JVP-flat Clear Regular rate and rhythm, no murmurs or gallops Abd-soft with active BS No Clubbing cyanosis edema Skin-warm and dry A & Oriented  Grossly normal sensory and motor function      Intake/Output Summary (Last 24 hours) at 05/30/13 0852 Last data filed at 05/29/13 2149  Gross per 24 hour  Intake      3 ml  Output      0 ml  Net      3 ml    LABS: Basic Metabolic Panel:  Recent Labs Lab 05/27/13 2129 05/28/13 0505 05/29/13 0600  NA 139 139 138  K 4.8 4.9 4.9  CL 96 98 100  CO2 21 20 25   GLUCOSE 98 82 77  BUN 83* 87* 44*  CREATININE 15.30* 15.87* 10.38*  CALCIUM 9.7 9.1 9.4   Cardiac Enzymes:  Recent Labs  05/28/13 0152 05/28/13 0750 05/28/13 1432  TROPONINI 0.39* 0.37* 0.33*   CBC:  Recent Labs Lab 05/27/13 2129 05/28/13 0505 05/29/13 0600 05/30/13 0519  WBC 7.0 6.6 5.9 5.5  NEUTROABS  --  4.3  --   --   HGB 11.4* 10.5* 11.0* 10.5*  HCT 32.6* 30.9* 32.2* 31.4*  MCV 88.1 87.8 88.5 90.0  PLT 194 184 184 170   PROTIME:  Recent Labs  05/28/13 1432  LABPROT 13.9  INR 1.08   Liver Function Tests:  Recent Labs  05/28/13 0505  AST 14  ALT  10  ALKPHOS 86  BILITOT 0.2*  PROT 6.8  ALBUMIN 3.2*   No results found for this basename: LIPASE, AMYLASE,  in the last 72 hours BNP: BNP (last 3 results)  Recent Labs  05/27/13 2129  PROBNP 4004.0*   D-Dimer: No results found for this basename: DDIMER,  in the last 72 hours Hemoglobin A1C: No results found for this basename: HGBA1C,  in the last 72 hours Fasting Lipid Panel:  Recent Labs  05/29/13 0600  CHOL 159  HDL 32*  LDLCALC 107*  TRIG 101  CHOLHDL 5.0   Thyroid Function Tests:  Recent Labs  05/28/13 0505  TSH 1.828   Anemia Panel: No results found for this basename: VITAMINB12, FOLATE, FERRITIN, TIBC, IRON, RETICCTPCT,  in the last 72 hours   Device Interrogation:    ASSESSMENT AND PLAN:  Principal Problem:   Chest pain Active Problems:   HTN (hypertension)   End stage renal  disease   Polysubstance abuse  Will reduce ASA 325>>81 for long term secondary prevention  D/c per primary service  F/u Dr [facility] 6-8 weeks  Signed, Sherryl Manges MD  05/30/2013

## 2013-05-30 NOTE — Discharge Summary (Signed)
Physician Discharge Summary  [name] [name] AVW:[id number] DOB: 1961/01/03 DOA: 05/27/2013  PCP: No primary provider on file.  Admit date: 05/27/2013 Discharge date: 05/30/2013  Time spent: Greater than 30 minutes  Recommendations for Outpatient Follow-up:  -Advised to followup with his dialysis Center on Monday. -Has been provided with information on shelters as well as substance abuse programs by social work.   Discharge Diagnoses:  Principal Problem:   Chest pain Active Problems:   HTN (hypertension)   End stage renal disease   Polysubstance abuse   Discharge Condition: Stable and improved  Filed Weights   05/29/13 0500 05/30/13 0634 05/30/13 1037  Weight: 147.6 kg (325 lb 6.4 oz) 146.7 kg (323 lb 6.6 oz) 147.6 kg (325 lb 6.4 oz)    History of present illness:  Patient is a 52 y.o. male history of ESRD on hemodialysis, hypertension and cocaine and tobacco abuse presented to the ER because of chest pain. Patient experienced chest pain last evening around 6 PM. He was waiting for the bus when he started developing retrosternal and left-sided chest pressure nonradiating with no associated shortness of breath diaphoresis or productive cough. By the time patient reached ER patient chest pain had resolved. EKG does not show anything acute. Cardiac enzymes came positive and cardiologist on call Dr.  Callas was consulted by ER physician and at this time since patient's chest pain-free patient has been admitted to hospitalist service. Patient admits to taking cocaine 2 days ago. Has dialysis on Monday Wednesday and Friday. He missed his dialysis on Monday.   Hospital Course:   Non-ST elevated MI  -Cath showed 80% stenosis of a first diagonal. Cardiology believed that considering patient's social circumstances, noncompliance and polysubstance abuse, stenting the diagonal was not a good choice. Election has been made for medical therapy.  End-stage renal disease  -On hemodialysis Monday  Wednesday Friday.  -As per renal.   Cocaine abuse  -Cessation counseling provided.  -Social work provided [employer].   Hypertension  -BP remains elevated but improved since admission. -Continue labetalol.  -Norvasc 5 mg has been added to his hypertensive regimen.   Procedures:  Cardiac catheterization   Consultations:  Dr. Excell Seltzer, cardiology  Dr. Arlean Hopping, nephrology  Discharge Instructions  Discharge Orders   Future Orders Complete By Expires     Diet - low sodium heart healthy  As directed     Discontinue IV  As directed     Increase activity slowly  As directed         Medication List    TAKE these medications       amLODipine 5 MG tablet  Commonly known as:  NORVASC  Take 1 tablet (5 mg total) by mouth daily.     aspirin 81 MG EC tablet  Take 1 tablet (81 mg total) by mouth daily.     atorvastatin 80 MG tablet  Commonly known as:  LIPITOR  Take 1 tablet (80 mg total) by mouth daily at 6 PM.     calcium acetate 667 MG capsule  Commonly known as:  PHOSLO  Take 2,668 mg by mouth 3 (three) times daily with meals.     gabapentin 100 MG capsule  Commonly known as:  NEURONTIN  Take 100 mg by mouth at bedtime as needed (nerve pain).     labetalol 300 MG tablet  Commonly known as:  NORMODYNE  Take 300 mg by mouth 2 (two) times daily. Take 300 mg twice a day on non dialysis days.  No Known Allergies    The results of significant diagnostics from this hospitalization (including imaging, microbiology, ancillary and laboratory) are listed below for reference.    Significant Diagnostic Studies: Dg Chest Portable 1 View  05/27/2013   *RADIOLOGY REPORT*  Clinical Data: Cough, shortness of breath and chest pain. Tightness in the chest and left arm.  PORTABLE CHEST - 1 VIEW  Comparison: Chest x-ray 09/02/2012.  Findings: Lung volumes are normal.  No acute consolidative airspace disease.  No pleural effusions.  Mild pulmonary venous congestion,  without frank pulmonary edema.  Mild cardiomegaly.  Upper mediastinal contours are within normal limits.  Atherosclerosis in the thoracic aorta.  IMPRESSION: 1.  Mild cardiomegaly with mild pulmonary venous congestion, but no frank pulmonary edema at this time. 2.  Atherosclerosis.   Original Report Authenticated By: Trudie Reed, M.D.    Microbiology: No results found for this or any previous visit (from the past 240 hour(s)).   Labs: Basic Metabolic Panel:  Recent Labs Lab 05/27/13 2129 05/28/13 0505 05/29/13 0600  NA 139 139 138  K 4.8 4.9 4.9  CL 96 98 100  CO2 21 20 25   GLUCOSE 98 82 77  BUN 83* 87* 44*  CREATININE 15.30* 15.87* 10.38*  CALCIUM 9.7 9.1 9.4   Liver Function Tests:  Recent Labs Lab 05/28/13 0505  AST 14  ALT 10  ALKPHOS 86  BILITOT 0.2*  PROT 6.8  ALBUMIN 3.2*   No results found for this basename: LIPASE, AMYLASE,  in the last 168 hours No results found for this basename: AMMONIA,  in the last 168 hours CBC:  Recent Labs Lab 05/27/13 2129 05/28/13 0505 05/29/13 0600 05/30/13 0519  WBC 7.0 6.6 5.9 5.5  NEUTROABS  --  4.3  --   --   HGB 11.4* 10.5* 11.0* 10.5*  HCT 32.6* 30.9* 32.2* 31.4*  MCV 88.1 87.8 88.5 90.0  PLT 194 184 184 170   Cardiac Enzymes:  Recent Labs Lab 05/28/13 0019 05/28/13 0152 05/28/13 0750 05/28/13 1432  TROPONINI 0.34* 0.39* 0.37* 0.33*   BNP: BNP (last 3 results)  Recent Labs  05/27/13 2129  PROBNP 4004.0*   CBG:  Recent Labs Lab 05/29/13 1639 05/29/13 2039 05/30/13 0027 05/30/13 0428 05/30/13 1130  GLUCAP 80 150* 109* 90 74       Signed:  [name],ESTELA  [facility] Pager: [phone] 05/30/2013, 11:56 AM

## 2013-05-30 NOTE — Procedures (Signed)
I was present at this dialysis session. I have reviewed the session itself and made appropriate changes.   Vinson Moselle, MD [employer] 05/30/2013, 9:19 AM

## 2013-09-09 ENCOUNTER — Inpatient Hospital Stay (HOSPITAL_COMMUNITY)
Admission: EM | Admit: 2013-09-09 | Discharge: 2013-09-12 | DRG: 682 | Disposition: A | Discharge status: Home / Self Care | Payer: Medicare Other | Attending: Internal Medicine | Admitting: Internal Medicine

## 2013-09-09 ENCOUNTER — Emergency Department (HOSPITAL_COMMUNITY): Payer: Medicare Other

## 2013-09-09 ENCOUNTER — Encounter (HOSPITAL_COMMUNITY): Payer: Self-pay | Admitting: *Deleted

## 2013-09-09 DIAGNOSIS — N2581 Secondary hyperparathyroidism of renal origin: Secondary | ICD-10-CM | POA: Diagnosis present

## 2013-09-09 DIAGNOSIS — Z23 Encounter for immunization: Secondary | ICD-10-CM

## 2013-09-09 DIAGNOSIS — N186 End stage renal disease: Secondary | ICD-10-CM | POA: Diagnosis present

## 2013-09-09 DIAGNOSIS — Z91158 Patient's noncompliance with renal dialysis for other reason: Secondary | ICD-10-CM

## 2013-09-09 DIAGNOSIS — F17200 Nicotine dependence, unspecified, uncomplicated: Secondary | ICD-10-CM | POA: Diagnosis present

## 2013-09-09 DIAGNOSIS — R079 Chest pain, unspecified: Secondary | ICD-10-CM

## 2013-09-09 DIAGNOSIS — Z5900 Homelessness unspecified: Secondary | ICD-10-CM

## 2013-09-09 DIAGNOSIS — F1410 Cocaine abuse, uncomplicated: Secondary | ICD-10-CM | POA: Diagnosis present

## 2013-09-09 DIAGNOSIS — Z9115 Patient's noncompliance with renal dialysis: Secondary | ICD-10-CM

## 2013-09-09 DIAGNOSIS — Z6841 Body Mass Index (BMI) 40.0 and over, adult: Secondary | ICD-10-CM

## 2013-09-09 DIAGNOSIS — E669 Obesity, unspecified: Secondary | ICD-10-CM | POA: Diagnosis present

## 2013-09-09 DIAGNOSIS — I10 Essential (primary) hypertension: Secondary | ICD-10-CM | POA: Diagnosis present

## 2013-09-09 DIAGNOSIS — F1910 Other psychoactive substance abuse, uncomplicated: Secondary | ICD-10-CM | POA: Diagnosis present

## 2013-09-09 DIAGNOSIS — E8779 Other fluid overload: Secondary | ICD-10-CM | POA: Diagnosis present

## 2013-09-09 DIAGNOSIS — Z79899 Other long term (current) drug therapy: Secondary | ICD-10-CM

## 2013-09-09 DIAGNOSIS — E875 Hyperkalemia: Secondary | ICD-10-CM | POA: Diagnosis not present

## 2013-09-09 DIAGNOSIS — Z96659 Presence of unspecified artificial knee joint: Secondary | ICD-10-CM

## 2013-09-09 DIAGNOSIS — E8770 Fluid overload, unspecified: Secondary | ICD-10-CM | POA: Diagnosis present

## 2013-09-09 DIAGNOSIS — M129 Arthropathy, unspecified: Secondary | ICD-10-CM | POA: Diagnosis present

## 2013-09-09 DIAGNOSIS — D649 Anemia, unspecified: Secondary | ICD-10-CM | POA: Diagnosis not present

## 2013-09-09 DIAGNOSIS — M899 Disorder of bone, unspecified: Secondary | ICD-10-CM | POA: Diagnosis present

## 2013-09-09 DIAGNOSIS — I120 Hypertensive chronic kidney disease with stage 5 chronic kidney disease or end stage renal disease: Principal | ICD-10-CM | POA: Diagnosis present

## 2013-09-09 LAB — BASIC METABOLIC PANEL
BUN: 77 mg/dL — ABNORMAL HIGH (ref 6–23)
CO2: 26 mEq/L (ref 19–32)
Calcium: 10.2 mg/dL (ref 8.4–10.5)
Chloride: 94 mEq/L — ABNORMAL LOW (ref 96–112)
Creatinine, Ser: 16.36 mg/dL — ABNORMAL HIGH (ref 0.50–1.35)
Glucose, Bld: 78 mg/dL (ref 70–99)

## 2013-09-09 LAB — CBC
HCT: 31 % — ABNORMAL LOW (ref 39.0–52.0)
Hemoglobin: 10.8 g/dL — ABNORMAL LOW (ref 13.0–17.0)
MCH: 29.8 pg (ref 26.0–34.0)
MCV: 85.6 fL (ref 78.0–100.0)
Platelets: 222 10*3/uL (ref 150–400)
RBC: 3.62 MIL/uL — ABNORMAL LOW (ref 4.22–5.81)
WBC: 8.4 10*3/uL (ref 4.0–10.5)

## 2013-09-09 LAB — POCT I-STAT TROPONIN I: Troponin i, poc: 0.06 ng/mL (ref 0.00–0.08)

## 2013-09-09 MED ORDER — SODIUM CHLORIDE 0.9 % IJ SOLN
3.0000 mL | Freq: Two times a day (BID) | INTRAMUSCULAR | Status: DC
  Administered 2013-09-10 – 2013-09-12 (×5): 3 mL via INTRAVENOUS

## 2013-09-09 MED ORDER — ASPIRIN 81 MG PO CHEW
324.0000 mg | CHEWABLE_TABLET | Freq: Once | ORAL | Status: AC
  Administered 2013-09-09: 324 mg via ORAL
  Filled 2013-09-09: qty 4

## 2013-09-09 MED ORDER — NITROGLYCERIN 2 % TD OINT
1.0000 [in_us] | TOPICAL_OINTMENT | Freq: Once | TRANSDERMAL | Status: AC
  Administered 2013-09-09: 1 [in_us] via TOPICAL
  Filled 2013-09-09: qty 1

## 2013-09-09 MED ORDER — ADULT MULTIVITAMIN W/MINERALS CH
1.0000 | ORAL_TABLET | Freq: Every day | ORAL | Status: DC
  Administered 2013-09-10 – 2013-09-12 (×3): 1 via ORAL
  Filled 2013-09-09 (×3): qty 1

## 2013-09-09 MED ORDER — AMLODIPINE BESYLATE 5 MG PO TABS
5.0000 mg | ORAL_TABLET | Freq: Every day | ORAL | Status: DC
  Administered 2013-09-10 – 2013-09-11 (×2): 5 mg via ORAL
  Filled 2013-09-09 (×3): qty 1

## 2013-09-09 MED ORDER — GABAPENTIN 100 MG PO CAPS
100.0000 mg | ORAL_CAPSULE | Freq: Every evening | ORAL | Status: DC | PRN
  Filled 2013-09-09: qty 1

## 2013-09-09 MED ORDER — LABETALOL HCL 300 MG PO TABS
300.0000 mg | ORAL_TABLET | Freq: Two times a day (BID) | ORAL | Status: DC
  Administered 2013-09-10 – 2013-09-11 (×3): 300 mg via ORAL
  Filled 2013-09-09 (×7): qty 1

## 2013-09-09 MED ORDER — NITROGLYCERIN 0.4 MG SL SUBL: 0.4000 mg | SUBLINGUAL_TABLET | SUBLINGUAL | Status: DC | PRN

## 2013-09-09 MED ORDER — HEPARIN SODIUM (PORCINE) 5000 UNIT/ML IJ SOLN
5000.0000 [IU] | Freq: Three times a day (TID) | INTRAMUSCULAR | Status: DC
  Filled 2013-09-09 (×10): qty 1

## 2013-09-09 MED ORDER — CALCIUM ACETATE 667 MG PO CAPS
2668.0000 mg | ORAL_CAPSULE | Freq: Three times a day (TID) | ORAL | Status: DC
  Administered 2013-09-10 – 2013-09-12 (×7): 2668 mg via ORAL
  Filled 2013-09-09 (×10): qty 4

## 2013-09-09 NOTE — H&P (Signed)
[facility] History and Physical  MARISOL GLAZER WUJ:[id number] DOB: [birth year] DOA: 09/09/2013  Referring physician: ED PCP: No primary provider on file.   Chief Complaint: Chest pain, SOB  HPI: [name] [name] is a 52 y.o. male who presents with CP, SOB, DOE.  Patient reports feeling poorly for several weeks with nonproductive cough, but this has really been worse in the last day or so.  Unfortunately he missed a dialysis session and so his last session was 4 days ago (last week).  He feels that he may be fluid overloaded and notes that this is not the first time that this has happened to him.  He is living with a friend but unfortunately that does not seem to be working out and he does not have any way to get to dialysis.  Review of Systems: 12 systems reviewed and otherwise negative.  Past Medical History  Diagnosis Date  . Hypertension   . End stage renal disease     a. MWF dialysis  . Arthritis   . Polysubstance abuse     a. tobacco and cocaine  . Chest pain     a. negative MV in 2004  . Abnormal ECG   . LVH (left ventricular hypertrophy)     a. 01/2013 Echo: EF 60-65%, sev concentric LVH, nl wall motion, sev dil LA.   Past Surgical History  Procedure Laterality Date  . Av fistula placement      Left x 2  . Cholecystectomy    . Total knee arthroplasty  09/20/2012    Procedure: TOTAL KNEE ARTHROPLASTY;  Surgeon: [name], MD;  [facility] OR;  Service: Orthopedics;  Laterality: Right;  right total knee arthroplasty  . Joint replacement Right 09/20/12   Social History:  reports that he has been smoking Cigarettes.  He has a 9.3 pack-year smoking history. He has never used smokeless tobacco. He reports that he uses illicit drugs (Cocaine). He reports that he does not drink alcohol.  No Known Allergies  Family History  Problem Relation Age of Onset  . Kidney failure Brother   . Hypertension Brother   . Kidney failure Brother   . Hypertension Mother   .  Diabetes Mother   . Hypertension Father   . Hypertension Sister     Prior to Admission medications   Medication Sig Start Date End Date Taking? Authorizing Provider  calcium acetate (PHOSLO) 667 MG capsule Take 2,668 mg by mouth 3 (three) times daily with meals.   Yes Historical Provider, MD  labetalol (NORMODYNE) 300 MG tablet Take 300 mg by mouth 2 (two) times daily. Take 300 mg twice a day on non dialysis days. 02/04/13  Yes Rollene Rotunda, MD  Multiple Vitamins-Minerals (MULTIVITAMIN WITH MINERALS) tablet Take 1 tablet by mouth daily.   Yes Historical Provider, MD  amLODipine (NORVASC) 5 MG tablet Take 1 tablet (5 mg total) by mouth daily. 05/30/13   Henderson Cloud, MD  gabapentin (NEURONTIN) 100 MG capsule Take 100 mg by mouth at bedtime as needed (nerve pain).     Historical Provider, MD   Physical Exam: Filed Vitals:   09/09/13 2321  BP: 154/85  Pulse: 85  Temp:   Resp: 22    General:  NAD, resting comfortably in bed Eyes: PEERLA EOMI ENT: mucous membranes moist Neck: supple w/o JVD Cardiovascular: RRR w/o MRG Respiratory: diminished BS bilaterally, difficult to auscultate due to patient size Abdomen: soft, nt, nd, bs+ Skin: no rash nor lesion  Musculoskeletal: MAE, full ROM all 4 extremities Psychiatric: normal tone and affect Neurologic: AAOx3, grossly non-focal  Labs on Admission:  Basic Metabolic Panel:  Recent Labs Lab 09/09/13 1930  NA 138  K 5.5*  CL 94*  CO2 26  GLUCOSE 78  BUN 77*  CREATININE 16.36*  CALCIUM 10.2   Liver Function Tests: No results found for this basename: AST, ALT, ALKPHOS, BILITOT, PROT, ALBUMIN,  in the last 168 hours No results found for this basename: LIPASE, AMYLASE,  in the last 168 hours No results found for this basename: AMMONIA,  in the last 168 hours CBC:  Recent Labs Lab 09/09/13 1930  WBC 8.4  HGB 10.8*  HCT 31.0*  MCV 85.6  PLT 222   Cardiac Enzymes: No results found for this basename: CKTOTAL,  CKMB, CKMBINDEX, TROPONINI,  in the last 168 hours  BNP (last 3 results)  Recent Labs  05/27/13 2129  PROBNP 4004.0*   CBG: No results found for this basename: GLUCAP,  in the last 168 hours  Radiological Exams on Admission: Dg Chest 2 View  09/09/2013   *RADIOLOGY REPORT*  Clinical Data: Of breath, chest pain  CHEST - 2 VIEW  Comparison: Prior chest x-ray 05/27/2013  Findings: Stable cardiomegaly with left heart enlargement.  Trace atherosclerotic calcifications in the transverse aorta.  Pulmonary vascular congestion without overt edema is similar compared to prior.  No focal airspace consolidation, pleural effusion or pneumothorax.  No acute osseous abnormality.  IMPRESSION: Cardiomegaly with left heart enlargement and pulmonary vascular congestion but no overt pulmonary edema.   Original Report Authenticated By: Malachy Moan, M.D.    EKG: Independently reviewed. Unchanged from 05/27/13, does have nonspecific ST segment changes concerning for ischemia but again these appear to be present on 05/27/13  Assessment/Plan Principal Problem:   Noncompliance of patient with renal dialysis Active Problems:   HTN (hypertension)   End stage renal disease   Polysubstance abuse   Fluid overload   1. ESRD, noncompliance with dialysis - unfortunately the patient has fluid overload as demonstrated by his CXR, his symptoms of DOE but feeling "ok as long as I'm not moving" are also classic for this.  Patient is aware of the need to get to dialysis, he states he knows that he will become fluid overloaded if he misses dialysis.  Cocaine use is also not helping.  Regardless, patient is stable at rest at this point in time, breathing easier after NTG, continue NTG paste.  Have spoken with nephrology Dr. Darrick Penna who will put the patient on the schedule for early morning tomorrow.  Checking serial troponins given the h/o chest pain but first one is negative. 2. Hyperkalemia - very mild at 5.5, monitor on  tele, no evidence of EKG changes, plan to resolve with dialysis. 3. HTN - continue / restart home meds as well as NTG, likely will improve with dialysis tomorrow.    Code Status: Full (must indicate code status--if unknown or must be presumed, indicate so) Family Communication: No family in room (indicate person spoken with, if applicable, with phone number if by telephone) Disposition Plan: Admit to inpatient (indicate anticipated LOS)  Time spent: 70 min  Madia Carvell M. [facility] Pager [phone]  If 7PM-7AM, please contact night-coverage [email or web address] Password [facility] 09/09/2013, 11:57 PM

## 2013-09-09 NOTE — ED Provider Notes (Signed)
CSN: [id number]     Arrival date & time 09/09/13  1920 History   First MD Initiated Contact with Patient 09/09/13 1957     Chief Complaint  Patient presents with  . Shortness of Breath  . Chest Pain   (Consider location/radiation/quality/duration/timing/severity/associated sxs/prior Treatment) HPI Comments: Patient presents to the ER for evaluation of difficulty breathing and shortness of breath. Patient reports that he has been feeling poorly for several weeks. He indicates that he has had a nonproductive cough over this period of time. Today, however, he started having increased shortness of breath and a tightness in his chest and radiating into the left arm. Patient does have end-stage renal disease, and is currently on hemodialysis. His last dialysis was 4 days ago. Patient indicates that he currently is living with a friend, but that isn't working out and he does not have a permanent residence her anyway to get to dialysis.  Patient is a 52 y.o. male presenting with shortness of breath and chest pain.  Shortness of Breath Associated symptoms: chest pain   Chest Pain Associated symptoms: shortness of breath     Past Medical History  Diagnosis Date  . Hypertension   . End stage renal disease     a. MWF dialysis  . Arthritis   . Polysubstance abuse     a. tobacco and cocaine  . Chest pain     a. negative MV in 2004  . Abnormal ECG   . LVH (left ventricular hypertrophy)     a. 01/2013 Echo: EF 60-65%, sev concentric LVH, nl wall motion, sev dil LA.   Past Surgical History  Procedure Laterality Date  . Av fistula placement      Left x 2  . Cholecystectomy    . Total knee arthroplasty  09/20/2012    Procedure: TOTAL KNEE ARTHROPLASTY;  Surgeon: [name], MD;  Location: [facility] OR;  Service: Orthopedics;  Laterality: Right;  right total knee arthroplasty  . Joint replacement Right 09/20/12   Family History  Problem Relation Age of Onset  . Kidney failure Brother   .  Hypertension Brother   . Kidney failure Brother   . Hypertension Mother   . Diabetes Mother   . Hypertension Father   . Hypertension Sister    History  Substance Use Topics  . Smoking status: Current Every Day Smoker -- 0.30 packs/day for 31 years    Types: Cigarettes  . Smokeless tobacco: Never Used     Comment: 2-4 cigarettes a day  . Alcohol Use: No    Review of Systems  Respiratory: Positive for shortness of breath.   Cardiovascular: Positive for chest pain.  All other systems reviewed and are negative.    Allergies  Review of patient's allergies indicates no known allergies.  Home Medications   Current Outpatient Rx  Name  Route  Sig  Dispense  Refill  . amLODipine (NORVASC) 5 MG tablet   Oral   Take 1 tablet (5 mg total) by mouth daily.   30 tablet   1   . calcium acetate (PHOSLO) 667 MG capsule   Oral   Take 2,668 mg by mouth 3 (three) times daily with meals.         . gabapentin (NEURONTIN) 100 MG capsule   Oral   Take 100 mg by mouth at bedtime as needed (nerve pain).          Marland Kitchen labetalol (NORMODYNE) 300 MG tablet   Oral   Take  300 mg by mouth 2 (two) times daily. Take 300 mg twice a day on non dialysis days.          BP 168/95  Pulse 85  Temp(Src) 98.1 F (36.7 C)  Resp 22  Wt 325 lb (147.419 kg)  BMI 45.35 kg/m2  SpO2 98% Physical Exam  Constitutional: He is oriented to person, place, and time. He appears well-developed and well-nourished. No distress.  HENT:  Head: Normocephalic and atraumatic.  Right Ear: Hearing normal.  Left Ear: Hearing normal.  Nose: Nose normal.  Mouth/Throat: Oropharynx is clear and moist and mucous membranes are normal.  Eyes: Conjunctivae and EOM are normal. Pupils are equal, round, and reactive to light.  Neck: Normal range of motion. Neck supple.  Cardiovascular: Regular rhythm, S1 normal and S2 normal.  Exam reveals no gallop and no friction rub.   No murmur heard. Pulmonary/Chest: Effort normal. No  respiratory distress. He has rales. He exhibits no tenderness.  Abdominal: Soft. Normal appearance and bowel sounds are normal. There is no hepatosplenomegaly. There is no tenderness. There is no rebound, no guarding, no tenderness at McBurney's point and negative Murphy's sign. No hernia.  Musculoskeletal: Normal range of motion.  Neurological: He is alert and oriented to person, place, and time. He has normal strength. No cranial nerve deficit or sensory deficit. Coordination normal. GCS eye subscore is 4. GCS verbal subscore is 5. GCS motor subscore is 6.  Skin: Skin is warm, dry and intact. No rash noted. No cyanosis.  Psychiatric: He has a normal mood and affect. His speech is normal and behavior is normal. Thought content normal.    ED Course  Procedures (including critical care time)   Date: 09/09/2013  Rate: 84  Rhythm: normal sinus rhythm  QRS Axis: left  Intervals: normal  ST/T Wave abnormalities: nonspecific ST/T changes  Conduction Disutrbances:none  Narrative Interpretation:   Old EKG Reviewed: unchanged    Labs Review Labs Reviewed  CBC - Abnormal; Notable for the following:    RBC 3.62 (*)    Hemoglobin 10.8 (*)    HCT 31.0 (*)    All other components within normal limits  BASIC METABOLIC PANEL  POCT I-STAT TROPONIN I   Imaging Review Dg Chest 2 View  09/09/2013   *RADIOLOGY REPORT*  Clinical Data: Of breath, chest pain  CHEST - 2 VIEW  Comparison: Prior chest x-ray 05/27/2013  Findings: Stable cardiomegaly with left heart enlargement.  Trace atherosclerotic calcifications in the transverse aorta.  Pulmonary vascular congestion without overt edema is similar compared to prior.  No focal airspace consolidation, pleural effusion or pneumothorax.  No acute osseous abnormality.  IMPRESSION: Cardiomegaly with left heart enlargement and pulmonary vascular congestion but no overt pulmonary edema.   Original Report Authenticated By: Malachy Moan, M.D.    MDM   Diagnosis: 1. Volume overload 2. End-stage renal disease 3. Uncontrolled hypertension  Patient presents with chest pain and shortness of breath. Patient apparently is now nose, has not been able to get his dialysis sessions. He has missed her 2 sessions. Workup today shows mild volume overload. He does not need emergent dialysis, will need urgent dialysis in the morning. He is experiencing chest pain, cardiac workup so far is negative. Patient has had a recent cardiac catheterization that did not show obstructive disease. He does, however, used cocaine. We'll need to monitor chest pain. Patient's pain and elevated blood pressure was treated with nitroglycerin and aspirin here in the ER. Patient will be admitted  Gilda Crease, MD 09/09/13 2251

## 2013-09-09 NOTE — ED Notes (Signed)
Pt in c/o cough over the last few weeks, states today he developed chest pain to the center of his chest, describes as a "pocket of air", pt also states his left arm has been going numb today, pt states he was due for dialysis yesterday, last had dialysis on Friday, pt c/o shortness of breath today also, speaking in short phrases

## 2013-09-10 ENCOUNTER — Encounter (HOSPITAL_COMMUNITY): Payer: Self-pay | Admitting: *Deleted

## 2013-09-10 LAB — RENAL FUNCTION PANEL
Albumin: 3.5 g/dL (ref 3.5–5.2)
Chloride: 93 mEq/L — ABNORMAL LOW (ref 96–112)
Creatinine, Ser: 16.27 mg/dL — ABNORMAL HIGH (ref 0.50–1.35)
GFR calc non Af Amer: 3 mL/min — ABNORMAL LOW (ref >90)
Potassium: 5.6 mEq/L — ABNORMAL HIGH (ref 3.5–5.1)

## 2013-09-10 LAB — CBC
Platelets: 166 10*3/uL (ref 150–400)
RDW: 14 % (ref 11.5–15.5)
WBC: 5.7 10*3/uL (ref 4.0–10.5)

## 2013-09-10 MED ORDER — LIDOCAINE-PRILOCAINE 2.5-2.5 % EX CREA: 1.0000 "application " | TOPICAL_CREAM | CUTANEOUS | Status: DC | PRN

## 2013-09-10 MED ORDER — SODIUM CHLORIDE 0.9 % IV SOLN: 100.0000 mL | INTRAVENOUS | Status: DC | PRN

## 2013-09-10 MED ORDER — PENTAFLUOROPROP-TETRAFLUOROETH EX AERO: 1.0000 "application " | INHALATION_SPRAY | CUTANEOUS | Status: DC | PRN

## 2013-09-10 MED ORDER — NITROGLYCERIN 2 % TD OINT
1.0000 [in_us] | TOPICAL_OINTMENT | Freq: Four times a day (QID) | TRANSDERMAL | Status: DC
  Administered 2013-09-10 – 2013-09-12 (×5): 1 [in_us] via TOPICAL
  Filled 2013-09-10 (×29): qty 30

## 2013-09-10 MED ORDER — HEPARIN SODIUM (PORCINE) 1000 UNIT/ML DIALYSIS: 1000.0000 [IU] | INTRAMUSCULAR | Status: DC | PRN

## 2013-09-10 MED ORDER — INFLUENZA VAC SPLIT QUAD 0.5 ML IM SUSP
0.5000 mL | INTRAMUSCULAR | Status: AC
  Administered 2013-09-11: 10:00:00 0.5 mL via INTRAMUSCULAR
  Filled 2013-09-10: qty 0.5

## 2013-09-10 MED ORDER — ALTEPLASE 2 MG IJ SOLR: 2.0000 mg | Freq: Once | INTRAMUSCULAR | Status: DC | PRN

## 2013-09-10 MED ORDER — NEPRO/CARBSTEADY PO LIQD: 237.0000 mL | ORAL | Status: DC | PRN

## 2013-09-10 MED ORDER — HEPARIN SODIUM (PORCINE) 1000 UNIT/ML DIALYSIS
9000.0000 [IU] | Freq: Once | INTRAMUSCULAR | Status: AC
  Administered 2013-09-10: 9000 [IU] via INTRAVENOUS_CENTRAL

## 2013-09-10 MED ORDER — LIDOCAINE HCL (PF) 1 % IJ SOLN: 5.0000 mL | INTRAMUSCULAR | Status: DC | PRN

## 2013-09-10 MED ORDER — PNEUMOCOCCAL VAC POLYVALENT 25 MCG/0.5ML IJ INJ
0.5000 mL | INJECTION | INTRAMUSCULAR | Status: AC
  Administered 2013-09-11: 11:00:00 0.5 mL via INTRAMUSCULAR
  Filled 2013-09-10: qty 0.5

## 2013-09-10 MED ORDER — ACETAMINOPHEN 325 MG PO TABS
650.0000 mg | ORAL_TABLET | Freq: Four times a day (QID) | ORAL | Status: DC | PRN
  Administered 2013-09-10: 650 mg via ORAL
  Filled 2013-09-10: qty 2

## 2013-09-10 NOTE — Progress Notes (Signed)
Pt is refusing to have continuous SpO2 put back on. MD notified. Will continue to monitor.

## 2013-09-10 NOTE — Progress Notes (Signed)
[facility] PROGRESS NOTE  [name] [name] ZOX:[id number] DOB: 06-Oct-1961 DOA: 09/09/2013 PCP: No primary provider on file.  HPI/Subjective: Has mild shortness of breath, denies any chest pain or palpitations  Assessment/Plan: Principal Problem:   Noncompliance of patient with renal dialysis Active Problems:   HTN (hypertension)   End stage renal disease   Polysubstance abuse   Fluid overload   Shortness of breath -Secondary to fluid overload, (there is no overt pulmonary edema) secondary to missing dialysis on 9/15. -Patient is for dialysis today, nephrology consulted already. -Patient did mention some chest pressure/pain, 3 sets of cardiac enzymes being cycled. -Likely the chest pressure is secondary to pulmonary vascular congestion.  Noncompliance with hemodialysis -With resultant fluid overload and pulmonary vascular congestion. -Patient is ESRD patient, nephrology consulted. -Patient gets his dialysis on M/W/F.  Hypertension -Continue preadmission medications, blood pressures is reasonably controlled.. -It was high last night probably patient missed his medication also.  Fluid overload -As above, secondary to noncompliance with hemodialysis.  Homelessness -Patient said recently he has been homeless, he does not want to talk to the Child psychotherapist.  Code Status: Full code Family Communication: Plan discussed with the patient. Disposition Plan: Remains inpatient   Consultants:  Nephrology  Procedures:  For hemodialysis  Antibiotics:  None   Objective: Filed Vitals:   09/10/13 0700  BP: 135/76  Pulse: 75  Temp: 97.7 F (36.5 C)  Resp: 19    Intake/Output Summary (Last 24 hours) at 09/10/13 1116 Last data filed at 09/10/13 0900  Gross per 24 hour  Intake    240 ml  Output      0 ml  Net    240 ml   Filed Weights   09/09/13 1926 09/10/13 0618  Weight: 147.419 kg (325 lb) 150.005 kg (330 lb 11.2 oz)    Exam: General: Alert and awake,  oriented x3, not in any acute distress. HEENT: anicteric sclera, pupils reactive to light and accommodation, EOMI CVS: S1-S2 clear, no murmur rubs or gallops Chest: clear to auscultation bilaterally, no wheezing, rales or rhonchi Abdomen: soft nontender, nondistended, normal bowel sounds, no organomegaly Extremities: no cyanosis, clubbing or edema noted bilaterally Neuro: Cranial nerves II-XII intact, no focal neurological deficits  Data Reviewed: Basic Metabolic Panel:  Recent Labs Lab 09/09/13 1930  NA 138  K 5.5*  CL 94*  CO2 26  GLUCOSE 78  BUN 77*  CREATININE 16.36*  CALCIUM 10.2   Liver Function Tests: No results found for this basename: AST, ALT, ALKPHOS, BILITOT, PROT, ALBUMIN,  in the last 168 hours No results found for this basename: LIPASE, AMYLASE,  in the last 168 hours No results found for this basename: AMMONIA,  in the last 168 hours CBC:  Recent Labs Lab 09/09/13 1930 09/10/13 0830  WBC 8.4 5.7  HGB 10.8* 9.3*  HCT 31.0* 27.1*  MCV 85.6 85.8  PLT 222 166   Cardiac Enzymes:  Recent Labs Lab 09/10/13 0035  TROPONINI <0.30   BNP (last 3 results)  Recent Labs  05/27/13 2129  PROBNP 4004.0*   CBG: No results found for this basename: GLUCAP,  in the last 168 hours  Micro No results found for this or any previous visit (from the past 240 hour(s)).   Studies: Dg Chest 2 View  09/09/2013   *RADIOLOGY REPORT*  Clinical Data: Of breath, chest pain  CHEST - 2 VIEW  Comparison: Prior chest x-ray 05/27/2013  Findings: Stable cardiomegaly with left heart enlargement.  Trace atherosclerotic calcifications in  the transverse aorta.  Pulmonary vascular congestion without overt edema is similar compared to prior.  No focal airspace consolidation, pleural effusion or pneumothorax.  No acute osseous abnormality.  IMPRESSION: Cardiomegaly with left heart enlargement and pulmonary vascular congestion but no overt pulmonary edema.   Original Report Authenticated  By: Malachy Moan, M.D.    Scheduled Meds: . amLODipine  5 mg Oral Daily  . calcium acetate  2,668 mg Oral TID WC  . heparin  5,000 Units Subcutaneous Q8H  . [START ON 09/11/2013] influenza vac split quadrivalent PF  0.5 mL Intramuscular Tomorrow-1000  . labetalol  300 mg Oral BID  . multivitamin with minerals  1 tablet Oral Daily  . nitroGLYCERIN  1 inch Topical Q6H  . [START ON 09/11/2013] pneumococcal 23 valent vaccine  0.5 mL Intramuscular Tomorrow-1000  . sodium chloride  3 mL Intravenous Q12H   Continuous Infusions:      Time spent: 35 minutes    [facility] A  [facility] Pager [phone] If 7PM-7AM, please contact night-coverage at [email or web address], password [facility] 09/10/2013, 11:16 AM  LOS: 1 day

## 2013-09-10 NOTE — Procedures (Signed)
I have personally attended this patient's dialysis session.  Patient being cannulated at present.  7 kg above EDW with SOB.  K 5.5 (2K bath)  4.5 hour treatment planned.    [name] [name] B

## 2013-09-10 NOTE — Progress Notes (Signed)
Pt admitted to unit from ED. Pt is A&O & skin intact. VS stable other than an elevated BP. Pt placed on tele & continuous SpO2. Call bell is within reach & pt is currently resting comfortably in bed. Will continue to monitor.

## 2013-09-10 NOTE — Consult Note (Signed)
[facility] Renal Consultation Note  Indication for Consultation:  Management of ESRD/hemodialysis; anemia, hypertension/volume and secondary hyperparathyroidism  HPI: [name] [name] is a 52 y.o. male with ESRD on dialysis on MWF at the [facility] who missed his last treatment on 9/15 and presented to the ED last night with worsening dyspnea on exertion and chest tightness.  He reports that he has been staying at a shelter in [facility], but last Thursday 9/11 had an argument with his case manager and had to leave.  A friend in [place] allowed him to move in, but offered crack cocaine which prevented him from going to dialysis on Monday 9/15.  Currently he is comfortable without any chest discomfort, but has dyspnea on exertion.  Chest x-ray shows pulmonary vascular congestion without overt edema, but he is 7 L over his dry weight.  He does not want to stay at his current place and has expressed interest  In returning to his original home in [place] to be closer to family.  Dialysis Orders:   MWF @ [facility] 4:30   143 kg   2K/2Ca   500/800    Heparin 9000-U bolus, 4000 U mid-HD   AVF @ LUA   Hectorol 10 mcg   Epogen 1200 U    Venofer 50 mg on Weds  Past Medical History  Diagnosis Date  . Hypertension   . End stage renal disease     a. MWF dialysis  . Arthritis   . Polysubstance abuse     a. tobacco and cocaine  . Chest pain     a. negative MV in 2004  . Abnormal ECG   . LVH (left ventricular hypertrophy)     a. 01/2013 Echo: EF 60-65%, sev concentric LVH, nl wall motion, sev dil LA.   Past Surgical History  Procedure Laterality Date  . Av fistula placement      Left x 2  . Cholecystectomy    . Total knee arthroplasty  09/20/2012    Procedure: TOTAL KNEE ARTHROPLASTY;  Surgeon: [name], MD;  Location: [facility] OR;  Service: Orthopedics;  Laterality: Right;  right total knee arthroplasty  . Joint replacement Right 09/20/12   Family History   Problem Relation Age of Onset  . Kidney failure Brother   . Hypertension Brother   . Kidney failure Brother   . Hypertension Mother   . Diabetes Mother   . Hypertension Father   . Hypertension Sister    Social History He has smoked cigarettes since he was a teenager and now uses about 1/2 pack a day.  He denies any alcohol use, but occasionally uses crack cocaine, which he has recently tried to stop.  No Known Allergies Prior to Admission medications   Medication Sig Start Date End Date Taking? Authorizing Provider  calcium acetate (PHOSLO) 667 MG capsule Take 2,668 mg by mouth 3 (three) times daily with meals.   Yes Historical Provider, MD  labetalol (NORMODYNE) 300 MG tablet Take 300 mg by mouth 2 (two) times daily. Take 300 mg twice a day on non dialysis days. 02/04/13  Yes Rollene Rotunda, MD  Multiple Vitamins-Minerals (MULTIVITAMIN WITH MINERALS) tablet Take 1 tablet by mouth daily.   Yes Historical Provider, MD  amLODipine (NORVASC) 5 MG tablet Take 1 tablet (5 mg total) by mouth daily. 05/30/13   Henderson Cloud, MD  gabapentin (NEURONTIN) 100 MG capsule Take 100 mg by mouth at bedtime as needed (nerve pain).  Historical Provider, MD   Constitutional: negative for chills, fatigue, fevers and sweats Ears, nose, mouth, throat, and face: negative for earaches, hoarseness, nasal congestion and sore throat Respiratory: positive for dyspnea on exertion; positive for cough, negative for  hemoptysis and sputum Cardiovascular: negative for chest pain, orthopnea and palpitations Gastrointestinal: negative for abdominal pain, change in bowel habits, nausea and vomiting Genitourinary:negative, oliguric Musculoskeletal:negative for arthralgias, back pain, myalgias and neck pain Neurological: negative for dizziness, gait problems, headaches, paresthesia and weakness  Physical Exam: Filed Vitals:   09/10/13 0700  BP: 135/76  Pulse: 75  Temp: 97.7 F (36.5 C)  Resp: 19    09/10/13 0618 150 kg  (dry weight 143 kg)  General appearance: alert, cooperative and no distress but coughing frequently and states SOB Head: Normocephalic, without obvious abnormality, atraumatic Neck: no adenopathy, no carotid bruit, no JVD and supple, symmetrical, trachea midline Resp: decreased breath sounds, mostly clear Cardio: regular rate and rhythm, S1, S2 normal, no murmur, click, rub or gallop GI: soft, non-tender; bowel sounds normal; no masses,  no organomegaly Extremities: extremities normal, atraumatic, no cyanosis or edema Neurologic: Grossly normal Dialysis Access: AVF @ LUA with + bruit    Labs:  Results for orders placed during the hospital encounter of 09/09/13 (from the past 48 hour(s))  CBC     Status: Abnormal   Collection Time    09/09/13  7:30 PM      Result Value Range   WBC 8.4  4.0 - 10.5 K/uL   RBC 3.62 (*) 4.22 - 5.81 MIL/uL   Hemoglobin 10.8 (*) 13.0 - 17.0 g/dL   HCT 45.4 (*) 09.8 - 11.9 %   MCV 85.6  78.0 - 100.0 fL   MCH 29.8  26.0 - 34.0 pg   MCHC 34.8  30.0 - 36.0 g/dL   RDW 14.7  82.9 - 56.2 %   Platelets 222  150 - 400 K/uL  BASIC METABOLIC PANEL     Status: Abnormal   Collection Time    09/09/13  7:30 PM      Result Value Range   Sodium 138  135 - 145 mEq/L   Potassium 5.5 (*) 3.5 - 5.1 mEq/L   Chloride 94 (*) 96 - 112 mEq/L   CO2 26  19 - 32 mEq/L   Glucose, Bld 78  70 - 99 mg/dL   BUN 77 (*) 6 - 23 mg/dL   Creatinine, Ser 13.08 (*) 0.50 - 1.35 mg/dL   Calcium 65.7  8.4 - 84.6 mg/dL   GFR calc non Af Amer 3 (*) >90 mL/min   GFR calc Af Amer 3 (*) >90 mL/min   Comment: (NOTE)     The eGFR has been calculated using the CKD EPI equation.     This calculation has not been validated in all clinical situations.     eGFR's persistently <90 mL/min signify possible Chronic Kidney     Disease.  POCT I-STAT TROPONIN I     Status: None   Collection Time    09/09/13  8:35 PM      Result Value Range   Troponin i, poc 0.06  0.00 - 0.08  ng/mL   Comment 3            Comment: Due to the release kinetics of cTnI,     a negative result within the first hours     of the onset of symptoms does not rule out     myocardial infarction with  certainty.     If myocardial infarction is still suspected,     repeat the test at appropriate intervals.  TROPONIN I     Status: None   Collection Time    09/10/13 12:35 AM      Result Value Range   Troponin I <0.30  <0.30 ng/mL   Comment:            Due to the release kinetics of cTnI,     a negative result within the first hours     of the onset of symptoms does not rule out     myocardial infarction with certainty.     If myocardial infarction is still suspected,     repeat the test at appropriate intervals.   Assessment/Plan: 1. Dyspnea - secondary to fluid overload (7 L), sec to missed HD on 9/15. 2. ESRD - HD on MWF @ [facility]; last HD 9/12; K 5.5 yesterday.  Check K pre-HD today. 3. Hypertension/volume - BP 135/76 most recently, on Amlodipine 5 mg qd, Labetalol 300 mg bid; current wt 150 kg with EDW 143.   4. Anemia - Hgb 10.8 on outpatient Epogen 1200 U; last T-sat 36%, on Venofer 50 mg qwk. 5. Metabolic bone disease - Ca 10.2, last P 5.1, iPTH 1250 (7/23); Hectorol 10 mcg, Phoslo 4 with meals, but Sensipar & Fosrenol also on outpatient med list. 6. Nutrition - Last Alb 4.2, renal diet, vitamin. 7. Cocaine abuse - cessation counseling provided during last hospitalization in June.  [name],[name] 09/10/2013, 10:16 AM   Attending Nephrologist: Camille Bal, MD I have seen and examined this patient and agree with plan as outlined above with highlighted additions.  Difficult social situation (pt is essentially homeless and presently staying with friend (where crack cocaine accessible) but says "I stay wherever I can lay my head".  Said to me "how can I get to dialysis when I don't know what time it is - I got no watch or clock" . Not sure what the solution is here.  Case Manager might  be able to help.  Will enlist assistance.  Usual dialysis today.   Meloni Hinz B,MD 09/10/2013 12:51 PM

## 2013-09-10 NOTE — Clinical Social Work Psychosocial (Signed)
Clinical Social Work Department BRIEF PSYCHOSOCIAL ASSESSMENT 09/10/2013  Patient:  [name] [name], [name] [name]     Account Number:  [id number]     Admit date:  09/09/2013  Clinical Social Worker:  Lavell Luster  Date/Time:  09/10/2013 11:10 AM  Referred by:  Physician  Date Referred:  09/10/2013 Referred for  Homelessness  Transportation assistance   Other Referral:   Interview type:  Patient Other interview type:    PSYCHOSOCIAL DATA Living Status:  OTHER Admitted from facility:   Level of care:   Primary support name:  None given Primary support relationship to patient:   Degree of support available:   Support is low. Patient states he has people he can stay with but doesn't want too...    CURRENT CONCERNS  Other Concerns:    SOCIAL WORK ASSESSMENT / PLAN Patient was not interested in meeting CSW and stated "this is not my first rodeo, they can't do anything for me." CSW offered patient shelter options, but patient stated that he would never go to a shelter again. CSW inquired about his transportation and patient states he has access to [employer] and uses the bus often. CSW asked if patient could afford cost of [employer], and patient said, "It's a $1.50, but I don't pay to that." CSW cannot offer patient any other resources.   Assessment/plan status:  No Further Intervention Required Other assessment/ plan:   Information/referral to community resources:   Patient refused all resources offered.    PATIENT'S/FAMILY'S RESPONSE TO PLAN OF CARE: Patient was not interested in resources. CSW signing off.     Roddie Mc, [place], [place], [phone]

## 2013-09-10 NOTE — Progress Notes (Signed)
Brief Nutrition Note:   RD pulled to pt for positive malnutrition screening tool. Pt indicated "unsure" in regards to possible weight.  RD spoke with pt about weight hx, pt states weight has been stable. Appetite "is always good".  Chart reviewed, pt weight up from missed HD sessions.  Wt Readings from Last 5 Encounters:  09/10/13 330 lb 11.2 oz (150.005 kg)  05/30/13 325 lb 6.4 oz (147.6 kg)  05/30/13 325 lb 6.4 oz (147.6 kg)  03/04/13 333 lb (151.048 kg)  02/04/13 322 lb 12.8 oz (146.421 kg)   Body mass index is 46.14 kg/(m^2). obesity class 3, extreme  Chart reviewed, no nutrition interventions warranted at this time. Please consult as needed.   Isabell Jarvis RD, LDN Pager [phone] After Hours pager [phone]

## 2013-09-11 LAB — RENAL FUNCTION PANEL
Albumin: 3.5 g/dL (ref 3.5–5.2)
GFR calc Af Amer: 6 mL/min — ABNORMAL LOW (ref >90)
GFR calc non Af Amer: 5 mL/min — ABNORMAL LOW (ref >90)
Glucose, Bld: 82 mg/dL (ref 70–99)
Phosphorus: 6.1 mg/dL — ABNORMAL HIGH (ref 2.3–4.6)
Potassium: 4.3 mEq/L (ref 3.5–5.1)
Sodium: 136 mEq/L (ref 135–145)

## 2013-09-11 LAB — CBC
Hemoglobin: 10.1 g/dL — ABNORMAL LOW (ref 13.0–17.0)
MCHC: 33.6 g/dL (ref 30.0–36.0)
Platelets: 195 10*3/uL (ref 150–400)
RDW: 14.3 % (ref 11.5–15.5)

## 2013-09-11 MED ORDER — PENTAFLUOROPROP-TETRAFLUOROETH EX AERO: 1.0000 "application " | INHALATION_SPRAY | CUTANEOUS | Status: DC | PRN

## 2013-09-11 MED ORDER — ALTEPLASE 2 MG IJ SOLR: 2.0000 mg | Freq: Once | INTRAMUSCULAR | Status: DC | PRN

## 2013-09-11 MED ORDER — NEPRO/CARBSTEADY PO LIQD: 237.0000 mL | ORAL | Status: DC | PRN

## 2013-09-11 MED ORDER — SODIUM CHLORIDE 0.9 % IV SOLN: 100.0000 mL | INTRAVENOUS | Status: DC | PRN

## 2013-09-11 MED ORDER — ALTEPLASE 2 MG IJ SOLR
2.0000 mg | Freq: Once | INTRAMUSCULAR | Status: DC | PRN
  Filled 2013-09-11: qty 2

## 2013-09-11 MED ORDER — HEPARIN SODIUM (PORCINE) 1000 UNIT/ML DIALYSIS: 1000.0000 [IU] | INTRAMUSCULAR | Status: DC | PRN

## 2013-09-11 MED ORDER — LIDOCAINE-PRILOCAINE 2.5-2.5 % EX CREA: 1.0000 "application " | TOPICAL_CREAM | CUTANEOUS | Status: DC | PRN

## 2013-09-11 MED ORDER — HEPARIN SODIUM (PORCINE) 1000 UNIT/ML DIALYSIS
9000.0000 [IU] | INTRAMUSCULAR | Status: DC | PRN
  Administered 2013-09-11: 9000 [IU] via INTRAVENOUS_CENTRAL
  Filled 2013-09-11: qty 9

## 2013-09-11 MED ORDER — NEPRO/CARBSTEADY PO LIQD
237.0000 mL | ORAL | Status: DC | PRN
  Filled 2013-09-11: qty 237

## 2013-09-11 MED ORDER — DARBEPOETIN ALFA-POLYSORBATE 100 MCG/0.5ML IJ SOLN
INTRAMUSCULAR | Status: AC
  Administered 2013-09-11: 100 ug via INTRAVENOUS
  Filled 2013-09-11: qty 0.5

## 2013-09-11 MED ORDER — LIDOCAINE HCL (PF) 1 % IJ SOLN: 5.0000 mL | INTRAMUSCULAR | Status: DC | PRN

## 2013-09-11 MED ORDER — HEPARIN SODIUM (PORCINE) 1000 UNIT/ML DIALYSIS: 9000.0000 [IU] | Freq: Once | INTRAMUSCULAR | Status: DC

## 2013-09-11 MED ORDER — DARBEPOETIN ALFA-POLYSORBATE 100 MCG/0.5ML IJ SOLN
100.0000 ug | INTRAMUSCULAR | Status: DC
  Administered 2013-09-11: 100 ug via INTRAVENOUS
  Filled 2013-09-11: qty 0.5

## 2013-09-11 MED ORDER — LIDOCAINE-PRILOCAINE 2.5-2.5 % EX CREA
1.0000 "application " | TOPICAL_CREAM | CUTANEOUS | Status: DC | PRN
  Filled 2013-09-11: qty 5

## 2013-09-11 MED ORDER — CINACALCET HCL 30 MG PO TABS
90.0000 mg | ORAL_TABLET | Freq: Every day | ORAL | Status: DC
  Administered 2013-09-11 – 2013-09-12 (×2): 90 mg via ORAL
  Filled 2013-09-11 (×3): qty 3

## 2013-09-11 NOTE — Care Management Note (Signed)
    Page 1 of 1   09/12/2013     4:33:37 PM   CARE MANAGEMENT NOTE 09/12/2013  Patient:  [name] [name], [name] [name]   Account Number:  [id number]  Date Initiated:  09/11/2013  Documentation initiated by:  Letha Cape  Subjective/Objective Assessment:   dx noncompliance of pt with HD, fluid overload.  admit- homeless.     Action/Plan:   Anticipated DC Date:  09/12/2013   Anticipated DC Plan:  HOME/SELF CARE      DC Planning Services  CM consult      Choice offered to / List presented to:             Status of service:  Completed, signed off Medicare Important Message given?   (If response is "NO", the following Medicare IM given date fields will be blank) Date Medicare IM given:   Date Additional Medicare IM given:    Discharge Disposition:  HOME/SELF CARE  Per UR Regulation:  Reviewed for med. necessity/level of care/duration of stay  If discussed at Long Length of Stay Meetings, dates discussed:    Comments:  09/12/13 16:32 Letha Cape RN, BSN [phone] patient for dc today, patient did not want to speak with CSW about shelter issues.  09/11/13 11:58 Letha Cape RN, BSN [phone] patient is homeless and he tells the CSW that he does not want to go to the shelter, and that he has friends he could stay with but he is not going to ask them if he can.

## 2013-09-11 NOTE — Progress Notes (Signed)
Subjective:  Dyspnea better after dialysis yesterday, but still with some shortness of breath on exertion, legs "feel heavy".  Objective: Vital signs in last 24 hours: Temp:  [97.7 F (36.5 C)-98.6 F (37 C)] 98.4 F (36.9 C) (09/18 0550) Pulse Rate:  [63-83] 72 (09/18 0550) Resp:  [14-22] 18 (09/18 0550) BP: (112-166)/(63-105) 118/63 mmHg (09/18 0550) SpO2:  [96 %-99 %] 98 % (09/18 0550) Weight:  [145.242 kg (320 lb 3.2 oz)-151.2 kg (333 lb 5.4 oz)] 145.242 kg (320 lb 3.2 oz) (09/17 2145) Weight change: 3.781 kg (8 lb 5.4 oz)  Intake/Output from previous day: 09/17 0701 - 09/18 0700 In: 420 [P.O.:420] Out: 5885    EXAM: General appearance:  Alert, in no apparent distress Resp:  Decreased breath sounds, mostly clear Cardio: RRR without murmur or rub GI: + BS, soft and nontender Extremities:  No edema Access:  AVF @ LUA with + bruit  Lab Results: None today  Recent Labs  09/09/13 1930 09/10/13 0830  WBC 8.4 5.7  HGB 10.8* 9.3*  HCT 31.0* 27.1*  PLT 222 166   BMET:   Recent Labs  09/09/13 1930 09/10/13 0830  NA 138 135  K 5.5* 5.6*  CL 94* 93*  CO2 26 21  GLUCOSE 78 122*  BUN 77* 80*  CREATININE 16.36* 16.27*  CALCIUM 10.2 9.4  ALBUMIN  --  3.5   Medications . amLODipine  5 mg Oral Daily  . calcium acetate  2,668 mg Oral TID WC  . cinacalcet  90 mg Oral Q breakfast  . heparin  5,000 Units Subcutaneous Q8H  . labetalol  300 mg Oral BID  . multivitamin with minerals  1 tablet Oral Daily  . nitroGLYCERIN  1 inch Topical Q6H  . pneumococcal 23 valent vaccine  0.5 mL Intramuscular Tomorrow-1000  . sodium chloride  3 mL Intravenous Q12H   Dialysis Orders: MWF @ [facility]  4:30 143 kg 2K/2Ca 500/800 Heparin 9000-U bolus, 4000 U mid-HD AVF @ LUA  Hectorol 10 mcg Epogen 1200 U Venofer 50 mg on Weds   Assessment/Plan: 1. Dyspnea - secondary to fluid overload (7 L), sec to missed HD on 9/15.  2. ESRD - HD on MWF @ [facility]; missed HD 9/15; K 5.5 pre-HD yesterday.   HD again today. I think will need lower EDW.  Weigh post HD today 3. Hypertension/volume - BP 118/63 most recently, on Amlodipine 5 mg qd, Labetalol 300 mg bid; wt 145.2 kg s/p net UF 5.9 L yesterday with EDW 143, but still symptomatic.  4. Anemia - Hgb 9.3 on outpatient Epogen 1200 U; last T-sat 36%, on Venofer 50 mg qwk. Give dose of darbepoetin with HD today 100 mcg 5. Metabolic bone disease - Ca 9.4 (9.8 corrected), P 9.1, iPTH 1250 (7/23); Hectorol 10 mcg, Phoslo 4 with meals, Sensipar.90 mg qd. 6. Nutrition - Alb 3.5, renal diet, vitamin.  7. Cocaine abuse - cessation counseling provided during last hospitalization in June. 8. Disposition - unclear since patient cannot go back to shelter in [facility] and by report was not receptive to SW recommendations - they will ask him what would be acceptable options in his mind and see if any feasible    LOS: 2 days   [name],[name] [name] 09/11/2013,9:08 AM

## 2013-09-11 NOTE — Clinical Social Work Note (Signed)
CSW unable to check in with patient today, as patient is not in room and at hemodialysis. CSW has confirmed with assistant CSW clinical director that there is nothing left to be done for patient regarding housing and transportation. Resources were offered, but reject by the patient, as evidenced by CSW's assessment. Patient's D/C to "streets" is not ideal, but patient has right to self-determination. Please review original assessment for any clarification. CSW signing off at this time.  Roddie Mc, [place], [place], [phone]

## 2013-09-11 NOTE — Progress Notes (Signed)
[facility] PROGRESS NOTE  [name] [name] ZOX:[id number] DOB: 1961/04/04 DOA: 09/09/2013 PCP: No primary provider on file.  HPI/Subjective: Does not keep it intact, denies any significant symptoms. Denies any shortness of breath.  Assessment/Plan: Principal Problem:   Noncompliance of patient with renal dialysis Active Problems:   HTN (hypertension)   End stage renal disease   Polysubstance abuse   Fluid overload   Shortness of breath -Secondary to fluid overload, (there is no overt pulmonary edema) secondary to missing dialysis on 9/15. -Patient is for dialysis today, nephrology consulted already. -Patient did mention some chest pressure/pain, 3 sets of cardiac enzymes being cycled. -Likely the chest pressure is secondary to pulmonary vascular congestion. Negative cardiac enzymes. -Shortness of breath resolved after dialysis and removal of 5.8 L of fluids.  Noncompliance with hemodialysis -With resultant fluid overload and pulmonary vascular congestion. -Patient is ESRD patient, nephrology consulted. -Patient gets his dialysis on M/W/F.  Hypertension -Continue preadmission medications, blood pressures is reasonably controlled.. -It was high last night probably patient missed his medication also.  Fluid overload -As above, secondary to noncompliance with hemodialysis.  Homelessness -Patient is to stay at [facility], he does not want to go back there. -We'll discuss with the social worker about safe discharge.  Code Status: Full code Family Communication: Plan discussed with the patient. Disposition Plan: Remains inpatient   Consultants:  Nephrology  Procedures:  For hemodialysis  Antibiotics:  None   Objective: Filed Vitals:   09/11/13 0550  BP: 118/63  Pulse: 72  Temp: 98.4 F (36.9 C)  Resp: 18    Intake/Output Summary (Last 24 hours) at 09/11/13 0907 Last data filed at 09/11/13 0028  Gross per 24 hour  Intake    180 ml  Output    5885 ml  Net  -5705 ml   Filed Weights   09/10/13 1256 09/10/13 1748 09/10/13 2145  Weight: 151.2 kg (333 lb 5.4 oz) 145.3 kg (320 lb 5.3 oz) 145.242 kg (320 lb 3.2 oz)    Exam: General: Alert and awake, oriented x3, not in any acute distress. HEENT: anicteric sclera, pupils reactive to light and accommodation, EOMI CVS: S1-S2 clear, no murmur rubs or gallops Chest: clear to auscultation bilaterally, no wheezing, rales or rhonchi Abdomen: soft nontender, nondistended, normal bowel sounds, no organomegaly Extremities: no cyanosis, clubbing or edema noted bilaterally Neuro: Cranial nerves II-XII intact, no focal neurological deficits  Data Reviewed: Basic Metabolic Panel:  Recent Labs Lab 09/09/13 1930 09/10/13 0830  NA 138 135  K 5.5* 5.6*  CL 94* 93*  CO2 26 21  GLUCOSE 78 122*  BUN 77* 80*  CREATININE 16.36* 16.27*  CALCIUM 10.2 9.4  PHOS  --  9.1*   Liver Function Tests:  Recent Labs Lab 09/10/13 0830  ALBUMIN 3.5   No results found for this basename: LIPASE, AMYLASE,  in the last 168 hours No results found for this basename: AMMONIA,  in the last 168 hours CBC:  Recent Labs Lab 09/09/13 1930 09/10/13 0830  WBC 8.4 5.7  HGB 10.8* 9.3*  HCT 31.0* 27.1*  MCV 85.6 85.8  PLT 222 166   Cardiac Enzymes:  Recent Labs Lab 09/10/13 0035 09/10/13 1230  TROPONINI <0.30 <0.30   BNP (last 3 results)  Recent Labs  05/27/13 2129  PROBNP 4004.0*   CBG: No results found for this basename: GLUCAP,  in the last 168 hours  Micro No results found for this or any previous visit (from the past 240 hour(s)).  Studies: Dg Chest 2 View  09/09/2013   *RADIOLOGY REPORT*  Clinical Data: Of breath, chest pain  CHEST - 2 VIEW  Comparison: Prior chest x-ray 05/27/2013  Findings: Stable cardiomegaly with left heart enlargement.  Trace atherosclerotic calcifications in the transverse aorta.  Pulmonary vascular congestion without overt edema is similar compared to prior.   No focal airspace consolidation, pleural effusion or pneumothorax.  No acute osseous abnormality.  IMPRESSION: Cardiomegaly with left heart enlargement and pulmonary vascular congestion but no overt pulmonary edema.   Original Report Authenticated By: Malachy Moan, M.D.    Scheduled Meds: . amLODipine  5 mg Oral Daily  . calcium acetate  2,668 mg Oral TID WC  . heparin  5,000 Units Subcutaneous Q8H  . influenza vac split quadrivalent PF  0.5 mL Intramuscular Tomorrow-1000  . labetalol  300 mg Oral BID  . multivitamin with minerals  1 tablet Oral Daily  . nitroGLYCERIN  1 inch Topical Q6H  . pneumococcal 23 valent vaccine  0.5 mL Intramuscular Tomorrow-1000  . sodium chloride  3 mL Intravenous Q12H   Continuous Infusions:      Time spent: 35 minutes    [facility]  [facility] Pager [phone] If 7PM-7AM, please contact night-coverage at [email or web address], password [facility] 09/11/2013, 9:07 AM  LOS: 2 days

## 2013-09-11 NOTE — Discharge Summary (Signed)
Physician Discharge Summary  [name] [name] WUJ:[id number] DOB: March 15, 1961 DOA: 09/09/2013  PCP: No primary provider on file.  Admit date: 09/09/2013 Discharge date: 09/11/2013  Time spent: 40 minutes  Recommendations for Outpatient Follow-up:  1. Followup with primary care physician within one week  Discharge Diagnoses:  Principal Problem:   Noncompliance of patient with renal dialysis Active Problems:   HTN (hypertension)   End stage renal disease   Polysubstance abuse   Fluid overload   Discharge Condition: Stable  Diet recommendation: Low potassium renal diet  Filed Weights   09/10/13 1748 09/10/13 2145 09/11/13 1327  Weight: 145.3 kg (320 lb 5.3 oz) 145.242 kg (320 lb 3.2 oz) 145.45 kg (320 lb 10.6 oz)    History of present illness:  [name] [name] is a 52 y.o. male who presents with CP, SOB, DOE. Patient reports feeling poorly for several weeks with nonproductive cough, but this has really been worse in the last day or so. Unfortunately he missed a dialysis session and so his last session was 4 days ago (last week). He feels that he may be fluid overloaded and notes that this is not the first time that this has happened to him. He is living with a friend but unfortunately that does not seem to be working out and he does not have any way to get to dialysis.  Hospital Course:   1. Shortness of breath: This is secondary to fluid overload, as mentioned on the chest x-ray there is no overt pulmonary edema, this is happened secondary to missing dialysis on 09/08/2013. Upon admission to the hospital dialysis started, nephrology was consulted, patient also did mention some chest pressure. Dialysis was done the very next day with successful removal of 5.8 L of fluids, patient symptoms improved very much. Decision was made to dialyze the patient again today. He has another session of hemodialysis prior to discharge. Patient did very well and his shortness of breath resolved.  2. Chest  pain: Patient was mentioning more like chest pressure, with heaviness when he is breathing which consistent with fluid overload. Acute carcinomas ruled out by -3 sets of cardiac enzymes. His stools EKG did not show any evidence of ischemia. Chest heaviness/pressure resolved after dialysis.  3. Noncompliance with hemodialysis: With resultant fluid overload and pulmonary vascular congestion, patient is ESRD patient, patient mentioned he was having difficulty getting to the dialysis center lately.  4. Hypertension: Continue preadmission medications, blood pressure is reasonably controlled.  5. Homelessness: The patient said he stays at [facility], patient does not want to go back to his previous shelter. This is been discussed with the social worker, and it is outlined in the patient's has capacity and good judgment and it as a consequence is patient can basically discharge from the hospital. Patient did not welcome the social worker help, please refer to the social worker note.  Procedures:  Hemodialysis  Consultations:  Nephrology  Discharge Exam: Filed Vitals:   09/11/13 1430  BP: 157/94  Pulse: 71  Temp:   Resp:   General: Alert and awake, oriented x3, not in any acute distress. HEENT: anicteric sclera, pupils reactive to light and accommodation, EOMI CVS: S1-S2 clear, no murmur rubs or gallops Chest: clear to auscultation bilaterally, no wheezing, rales or rhonchi Abdomen: soft nontender, nondistended, normal bowel sounds, no organomegaly Extremities: no cyanosis, clubbing or edema noted bilaterally Neuro: Cranial nerves II-XII intact, no focal neurological deficits  Discharge Instructions     Medication List  amLODipine 5 MG tablet  Commonly known as:  NORVASC  Take 1 tablet (5 mg total) by mouth daily.     calcium acetate 667 MG capsule  Commonly known as:  PHOSLO  Take 2,668 mg by mouth 3 (three) times daily with meals.     gabapentin 100 MG  capsule  Commonly known as:  NEURONTIN  Take 100 mg by mouth at bedtime as needed (nerve pain).     labetalol 300 MG tablet  Commonly known as:  NORMODYNE  Take 300 mg by mouth 2 (two) times daily. Take 300 mg twice a day on non dialysis days.     multivitamin with minerals tablet  Take 1 tablet by mouth daily.       No Known Allergies    The results of significant diagnostics from this hospitalization (including imaging, microbiology, ancillary and laboratory) are listed below for reference.    Significant Diagnostic Studies: Dg Chest 2 View  09/09/2013   *RADIOLOGY REPORT*  Clinical Data: Of breath, chest pain  CHEST - 2 VIEW  Comparison: Prior chest x-ray 05/27/2013  Findings: Stable cardiomegaly with left heart enlargement.  Trace atherosclerotic calcifications in the transverse aorta.  Pulmonary vascular congestion without overt edema is similar compared to prior.  No focal airspace consolidation, pleural effusion or pneumothorax.  No acute osseous abnormality.  IMPRESSION: Cardiomegaly with left heart enlargement and pulmonary vascular congestion but no overt pulmonary edema.   Original Report Authenticated By: Malachy Moan, M.D.    Microbiology: No results found for this or any previous visit (from the past 240 hour(s)).   Labs: Basic Metabolic Panel:  Recent Labs Lab 09/09/13 1930 09/10/13 0830 09/11/13 1402  NA 138 135 136  K 5.5* 5.6* 4.3  CL 94* 93* 92*  CO2 26 21 28   GLUCOSE 78 122* 82  BUN 77* 80* 41*  CREATININE 16.36* 16.27* 10.30*  CALCIUM 10.2 9.4 9.7  PHOS  --  9.1* 6.1*   Liver Function Tests:  Recent Labs Lab 09/10/13 0830 09/11/13 1402  ALBUMIN 3.5 3.5   No results found for this basename: LIPASE, AMYLASE,  in the last 168 hours No results found for this basename: AMMONIA,  in the last 168 hours CBC:  Recent Labs Lab 09/09/13 1930 09/10/13 0830 09/11/13 1402  WBC 8.4 5.7 6.1  HGB 10.8* 9.3* 10.1*  HCT 31.0* 27.1* 30.1*  MCV  85.6 85.8 87.2  PLT 222 166 195   Cardiac Enzymes:  Recent Labs Lab 09/10/13 0035 09/10/13 1230  TROPONINI <0.30 <0.30   BNP: BNP (last 3 results)  Recent Labs  05/27/13 2129  PROBNP 4004.0*   CBG: No results found for this basename: GLUCAP,  in the last 168 hours     Signed:  Toriann Spadoni A  [facility] 09/11/2013, 3:16 PM

## 2013-09-12 DIAGNOSIS — I10 Essential (primary) hypertension: Secondary | ICD-10-CM

## 2013-09-12 LAB — RENAL FUNCTION PANEL
Albumin: 3.5 g/dL (ref 3.5–5.2)
CO2: 28 mEq/L (ref 19–32)
Calcium: 9.1 mg/dL (ref 8.4–10.5)
GFR calc Af Amer: 8 mL/min — ABNORMAL LOW (ref >90)
GFR calc non Af Amer: 7 mL/min — ABNORMAL LOW (ref >90)
Sodium: 135 mEq/L (ref 135–145)

## 2013-09-12 LAB — CBC
MCH: 29.7 pg (ref 26.0–34.0)
Platelets: 186 10*3/uL (ref 150–400)
RBC: 3.33 MIL/uL — ABNORMAL LOW (ref 4.22–5.81)
WBC: 6 10*3/uL (ref 4.0–10.5)

## 2013-09-12 MED ORDER — DOXERCALCIFEROL 2.5 MCG PO CAPS
10.0000 ug | ORAL_CAPSULE | ORAL | Status: DC
  Filled 2013-09-12: qty 4

## 2013-09-12 MED ORDER — AMLODIPINE BESYLATE 5 MG PO TABS
5.0000 mg | ORAL_TABLET | Freq: Every evening | ORAL | Status: DC
  Administered 2013-09-12: 19:00:00 5 mg via ORAL
  Filled 2013-09-12: qty 1

## 2013-09-12 NOTE — Procedures (Signed)
I have personally attended this patient's dialysis session. Day 3 of serial treatments and now back on schedule.   EDW 143 and pre HD today is 142.6 so will have lower EDW, just depends on what we can pull off today. K 4.3 2K bath. Left AVF 500 BFR.     Elese Rane B

## 2013-09-12 NOTE — Progress Notes (Signed)
Patient said that he would no longer like to wear his cardiac monitor. RN educated patient on importance of wearing monitor. Patient stated that if any staff needs to "check leads or change batteries again'-he will take off the monitor himself for the rest of his stay. Will continue to monitor

## 2013-09-12 NOTE — Progress Notes (Signed)
Subjective:  No cos. Tolerated extra hd yesterday , no sob this am , for normal schedule hd today Objective Vital signs in last 24 hours: Filed Vitals:   09/11/13 1739 09/11/13 2230 09/12/13 0603 09/12/13 0836  BP: 145/96 150/93 127/77 150/90  Pulse: 73 77 76 80  Temp:  98.5 F (36.9 C) 98.6 F (37 C) 98.7 F (37.1 C)  TempSrc:  Oral Oral Oral  Resp:  18 18   Height:      Weight:      SpO2:  97% 97% 97%   Weight change: -5.75 kg (-12 lb 10.8 oz)  Intake/Output Summary (Last 24 hours) at 09/12/13 0915 Last data filed at 09/11/13 1837  Gross per 24 hour  Intake    720 ml  Output   2900 ml  Net  -2180 ml   Labs: Basic Metabolic Panel:  Recent Labs Lab 09/09/13 1930 09/10/13 0830 09/11/13 1402  NA 138 135 136  K 5.5* 5.6* 4.3  CL 94* 93* 92*  CO2 26 21 28   GLUCOSE 78 122* 82  BUN 77* 80* 41*  CREATININE 16.36* 16.27* 10.30*  CALCIUM 10.2 9.4 9.7  PHOS  --  9.1* 6.1*   Liver Function Tests:  Recent Labs Lab 09/10/13 0830 09/11/13 1402  ALBUMIN 3.5 3.5   CBC:  Recent Labs Lab 09/09/13 1930 09/10/13 0830 09/11/13 1402  WBC 8.4 5.7 6.1  HGB 10.8* 9.3* 10.1*  HCT 31.0* 27.1* 30.1*  MCV 85.6 85.8 87.2  PLT 222 166 195   Cardiac Enzymes:  Recent Labs Lab 09/10/13 0035 09/10/13 1230  TROPONINI <0.30 <0.30    Medications:   . amLODipine  5 mg Oral Daily  . calcium acetate  2,668 mg Oral TID WC  . cinacalcet  90 mg Oral Q breakfast  . darbepoetin (ARANESP) injection - DIALYSIS  100 mcg Intravenous Q Thu-HD  . heparin  5,000 Units Subcutaneous Q8H  . labetalol  300 mg Oral BID  . multivitamin with minerals  1 tablet Oral Daily  . nitroGLYCERIN  1 inch Topical Q6H  . sodium chloride  3 mL Intravenous Q12H    Physical Exam: General: Alert,NAD, obese BM eating brk Heart: RRR, no mur, no rub Lungs: Decreased BS At bases, otherwise CTA Abdomen: Obese, bs pos. , soft , nontender Extremities: Dialysis Access: no pedal edema,Pos. Bruit LUA AVF    Dialysis Orders: MWF @ [facility]  4:30 143 kg 2K/2Ca 500/800 Heparin 9000-U bolus, 4000 U mid-HD AVF @ LUA  Hectorol 10 mcg Epogen 1200 U Venofer 50 mg on Weds   Assessment/Plan:  1. Dyspnea - secondary to fluid overload (7 L> edw and missed Dialysis's (09/08/13) resolve with UF on hd 2. ESRD - HD on MWF @ [facility]; yesterday no post wt. Avail . Pre wt. 145.45 with 2900 cc uf and tolerated for normal scheduled hd today fu wts for new lower edw 3. Hypertension/volume - BP 122/77 this am most recently, on Amlodipine 5 mg qd, ( make HS) Labetalol 300 mg bid; wt 145.2 kg s/p net UF 5.9 L yesterday with EDW 143, but still symptomatic.  4. Anemia - Hgb 10.1 on outpatient Epogen 1200 U;129mcg Aranesp in hosp  Yesterday/ last T-sat 36%, on Venofer 50 mg qwk. 5. Metabolic bone disease - Ca 9.1 (10.2 corrected), P 9.1> 6.1/ iPTH 1250 (7/23);no vit d with evala ted p on admit restaer 10 mcg Hectoral q hd, Phoslo 4 with meals, Sensipar.90 mg qd. 6. Nutrition - Alb 3.5, renal  diet, vitamin.  7. Cocaine abuse - prior dw pt . In June admit  And outpt. Dialysis center counselling 8. Disposition - possible dc after hd today/ and sw signed off yesterday helping with ? Shelter  / again dw pt. Compliance with dialysis attendence  [name] Pastel, PA-C [facility] Beeper [phone] 09/12/2013,9:15 AM  LOS: 3 days  I have seen and examined this patient and agree with plan as outlined in the note above.  Pt will have his regular Friday dialysis today. Has no place to go when discharged but by report not accepting of offers made by CSW (no sure what those were).  He wasy "wouldn't be the first time".Camille Bal B,MD 09/12/2013 12:16 PM

## 2013-09-12 NOTE — Progress Notes (Signed)
Patient has decided to remove his telemetry box. He said that he is tired of the alarms and of staff walking in and out of the room. RN tried to talk to patient but patient said that he will not put the tele back on and asked that I get out of his room.

## 2013-09-12 NOTE — Progress Notes (Signed)
Discharge instructions reviewed with patient. PIV removed.   [name] [name], [name] [name]

## 2013-09-18 IMAGING — CR DG CHEST 1V PORT
1 series · 1 of 1 positions shown · non-contrast
Comparison: Chest x-ray 09/02/2012.

CLINICAL DATA: Cough, shortness of breath and chest pain.
Tightness in the chest and left arm.

PORTABLE CHEST - 1 VIEW

[AP]
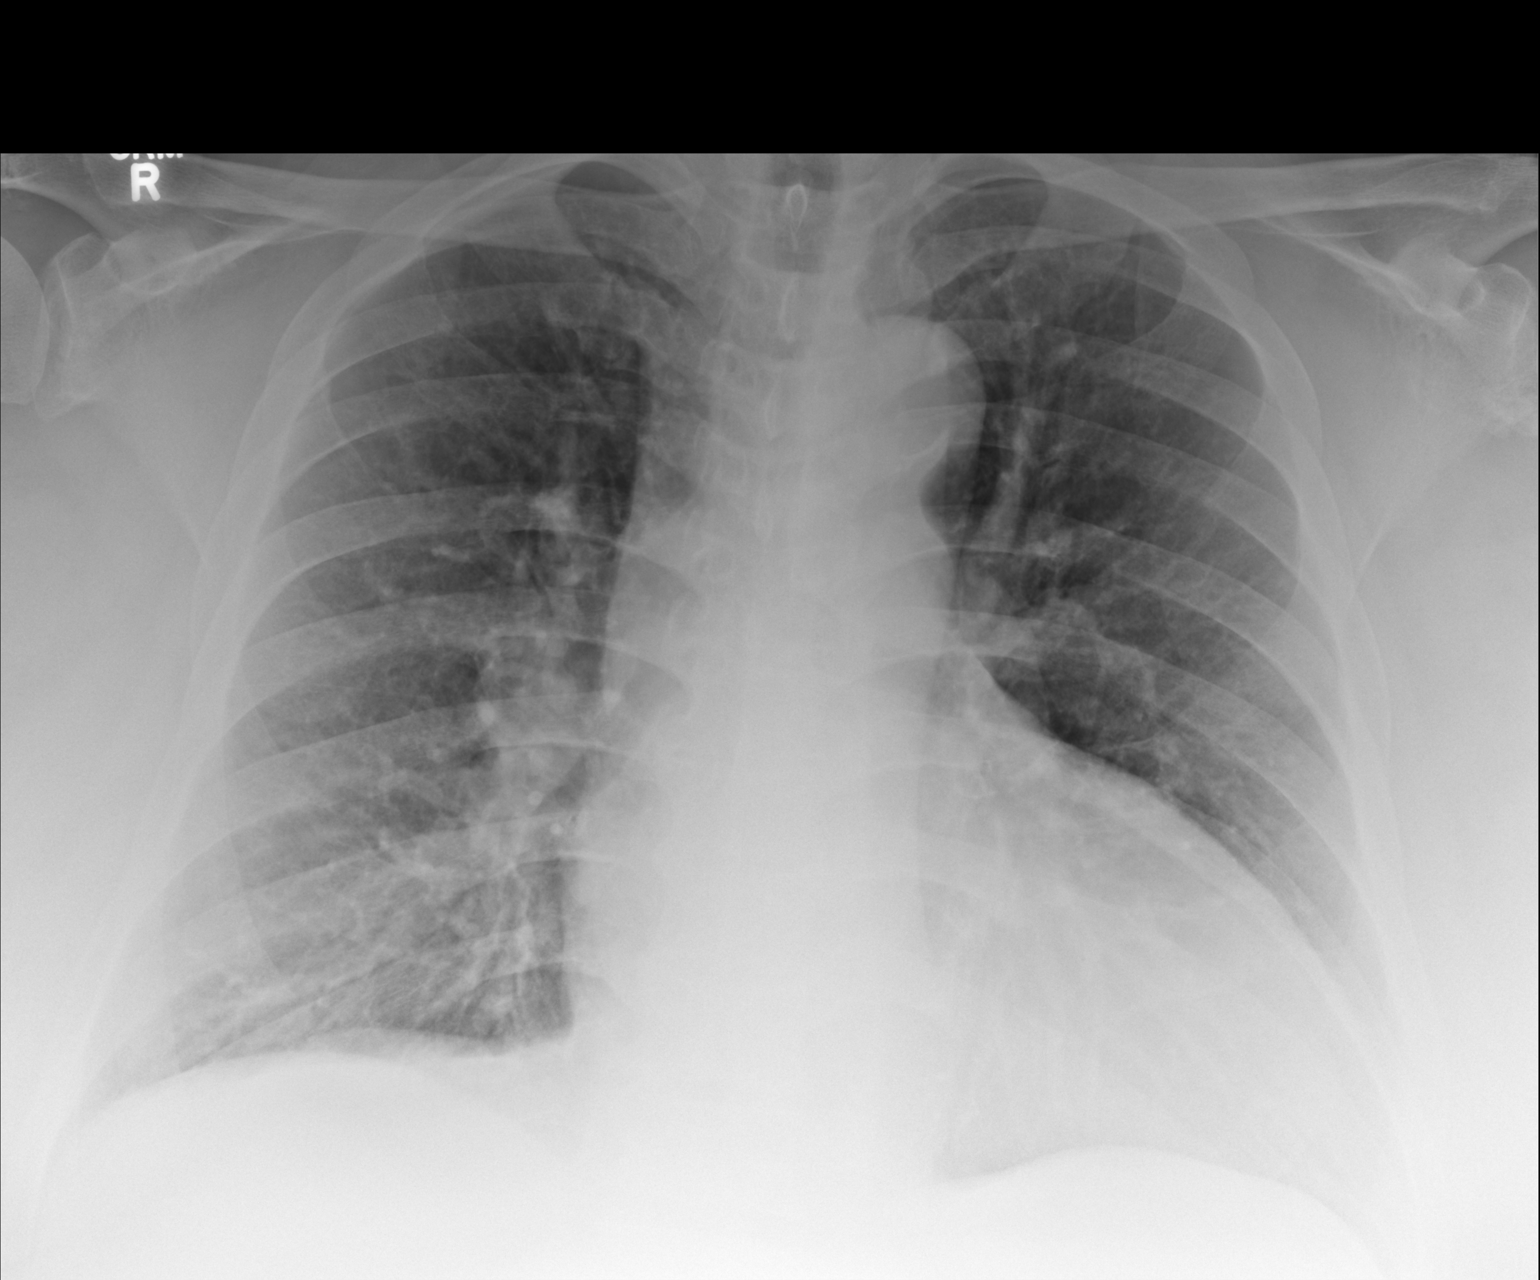

[1 of 1 positions shown; findings below may reference images not displayed]

FINDINGS: Lung volumes are normal.  No acute consolidative airspace
disease.  No pleural effusions.  Mild pulmonary venous congestion,
without frank pulmonary edema.  Mild cardiomegaly.  Upper
mediastinal contours are within normal limits.  Atherosclerosis in
the thoracic aorta.
IMPRESSION: 1.  Mild cardiomegaly with mild pulmonary venous congestion, but no
frank pulmonary edema at this time.
2.  Atherosclerosis.

## 2013-10-14 ENCOUNTER — Encounter (HOSPITAL_COMMUNITY): Payer: Self-pay | Admitting: Emergency Medicine

## 2013-10-14 DIAGNOSIS — I120 Hypertensive chronic kidney disease with stage 5 chronic kidney disease or end stage renal disease: Principal | ICD-10-CM | POA: Diagnosis present

## 2013-10-14 DIAGNOSIS — E875 Hyperkalemia: Secondary | ICD-10-CM | POA: Diagnosis present

## 2013-10-14 DIAGNOSIS — Z66 Do not resuscitate: Secondary | ICD-10-CM | POA: Diagnosis present

## 2013-10-14 DIAGNOSIS — N186 End stage renal disease: Secondary | ICD-10-CM | POA: Diagnosis present

## 2013-10-14 DIAGNOSIS — M129 Arthropathy, unspecified: Secondary | ICD-10-CM | POA: Diagnosis present

## 2013-10-14 DIAGNOSIS — Z91158 Patient's noncompliance with renal dialysis for other reason: Secondary | ICD-10-CM

## 2013-10-14 DIAGNOSIS — Z992 Dependence on renal dialysis: Secondary | ICD-10-CM

## 2013-10-14 DIAGNOSIS — Z96659 Presence of unspecified artificial knee joint: Secondary | ICD-10-CM

## 2013-10-14 DIAGNOSIS — Z9115 Patient's noncompliance with renal dialysis: Secondary | ICD-10-CM

## 2013-10-14 NOTE — ED Notes (Signed)
Pt. reports bilateral leg numbness onset yesterday , ambulatory using his cane , denies injury or fall . Respirations unlabored.

## 2013-10-15 ENCOUNTER — Emergency Department (HOSPITAL_COMMUNITY): Payer: Medicare Other

## 2013-10-15 ENCOUNTER — Encounter (HOSPITAL_COMMUNITY): Payer: Self-pay | Admitting: General Practice

## 2013-10-15 ENCOUNTER — Inpatient Hospital Stay (HOSPITAL_COMMUNITY)
Admission: EM | Admit: 2013-10-15 | Discharge: 2013-10-15 | DRG: 682 | Disposition: A | Discharge status: Home / Self Care | Payer: Medicare Other | Attending: Internal Medicine | Admitting: Internal Medicine

## 2013-10-15 DIAGNOSIS — E8770 Fluid overload, unspecified: Secondary | ICD-10-CM | POA: Diagnosis present

## 2013-10-15 DIAGNOSIS — Z96659 Presence of unspecified artificial knee joint: Secondary | ICD-10-CM

## 2013-10-15 DIAGNOSIS — E8779 Other fluid overload: Secondary | ICD-10-CM

## 2013-10-15 DIAGNOSIS — E875 Hyperkalemia: Secondary | ICD-10-CM

## 2013-10-15 DIAGNOSIS — F1410 Cocaine abuse, uncomplicated: Secondary | ICD-10-CM

## 2013-10-15 DIAGNOSIS — N186 End stage renal disease: Secondary | ICD-10-CM

## 2013-10-15 DIAGNOSIS — Z9115 Patient's noncompliance with renal dialysis: Secondary | ICD-10-CM

## 2013-10-15 DIAGNOSIS — I10 Essential (primary) hypertension: Secondary | ICD-10-CM | POA: Diagnosis present

## 2013-10-15 DIAGNOSIS — F1910 Other psychoactive substance abuse, uncomplicated: Secondary | ICD-10-CM

## 2013-10-15 DIAGNOSIS — R079 Chest pain, unspecified: Secondary | ICD-10-CM

## 2013-10-15 HISTORY — DX: Hyperkalemia: E87.5

## 2013-10-15 LAB — POCT I-STAT TROPONIN I: Troponin i, poc: 0.21 ng/mL (ref 0.00–0.08)

## 2013-10-15 LAB — BASIC METABOLIC PANEL
BUN: 88 mg/dL — ABNORMAL HIGH (ref 6–23)
CO2: 22 mEq/L (ref 19–32)
Calcium: 10 mg/dL (ref 8.4–10.5)
Chloride: 92 mEq/L — ABNORMAL LOW (ref 96–112)
Creatinine, Ser: 16.9 mg/dL — ABNORMAL HIGH (ref 0.50–1.35)
GFR calc Af Amer: 3 mL/min — ABNORMAL LOW (ref >90)
Glucose, Bld: 47 mg/dL — ABNORMAL LOW (ref 70–99)
Potassium: 5.9 mEq/L — ABNORMAL HIGH (ref 3.5–5.1)

## 2013-10-15 LAB — COMPREHENSIVE METABOLIC PANEL
ALT: 11 U/L (ref 0–53)
AST: 14 U/L (ref 0–37)
Albumin: 3.7 g/dL (ref 3.5–5.2)
Calcium: 9.8 mg/dL (ref 8.4–10.5)
Sodium: 135 mEq/L (ref 135–145)
Total Protein: 7.7 g/dL (ref 6.0–8.3)

## 2013-10-15 LAB — URINE MICROSCOPIC-ADD ON

## 2013-10-15 LAB — CBC WITH DIFFERENTIAL/PLATELET
Basophils Absolute: 0 10*3/uL (ref 0.0–0.1)
Basophils Relative: 0 % (ref 0–1)
Eosinophils Absolute: 0.2 10*3/uL (ref 0.0–0.7)
Eosinophils Relative: 2 % (ref 0–5)
Lymphocytes Relative: 18 % (ref 12–46)
MCH: 29.8 pg (ref 26.0–34.0)
MCHC: 34.4 g/dL (ref 30.0–36.0)
MCV: 86.5 fL (ref 78.0–100.0)
Platelets: 206 10*3/uL (ref 150–400)
RDW: 14.2 % (ref 11.5–15.5)
WBC: 7.9 10*3/uL (ref 4.0–10.5)

## 2013-10-15 LAB — RAPID URINE DRUG SCREEN, HOSP PERFORMED
Benzodiazepines: NOT DETECTED
Cocaine: POSITIVE — AB

## 2013-10-15 LAB — URINALYSIS, ROUTINE W REFLEX MICROSCOPIC
Bilirubin Urine: NEGATIVE
Specific Gravity, Urine: 1.01 (ref 1.005–1.030)
pH: 8.5 — ABNORMAL HIGH (ref 5.0–8.0)

## 2013-10-15 MED ORDER — HYDROCODONE-ACETAMINOPHEN 5-325 MG PO TABS
1.0000 | ORAL_TABLET | ORAL | Status: DC | PRN
  Administered 2013-10-15: 2 via ORAL

## 2013-10-15 MED ORDER — ONDANSETRON HCL 4 MG/2ML IJ SOLN: 4.0000 mg | Freq: Four times a day (QID) | INTRAMUSCULAR | Status: DC | PRN

## 2013-10-15 MED ORDER — AMLODIPINE BESYLATE 5 MG PO TABS: 10.0000 mg | ORAL_TABLET | Freq: Every day | ORAL | Status: DC

## 2013-10-15 MED ORDER — CALCIUM ACETATE 667 MG PO CAPS
2668.0000 mg | ORAL_CAPSULE | Freq: Three times a day (TID) | ORAL | Status: DC
  Filled 2013-10-15 (×2): qty 4

## 2013-10-15 MED ORDER — SODIUM CHLORIDE 0.9 % IV SOLN
1.0000 g | Freq: Once | INTRAVENOUS | Status: AC
  Administered 2013-10-15: 1 g via INTRAVENOUS
  Filled 2013-10-15: qty 10

## 2013-10-15 MED ORDER — DOXERCALCIFEROL 4 MCG/2ML IV SOLN
INTRAVENOUS | Status: AC
  Administered 2013-10-15: 10 ug via INTRAVENOUS
  Filled 2013-10-15: qty 6

## 2013-10-15 MED ORDER — HEPARIN SODIUM (PORCINE) 1000 UNIT/ML IJ SOLN
3000.0000 [IU] | Freq: Once | INTRAMUSCULAR | Status: AC
  Administered 2013-10-15: 3000 [IU] via INTRAVENOUS

## 2013-10-15 MED ORDER — AMLODIPINE BESYLATE 5 MG PO TABS
5.0000 mg | ORAL_TABLET | Freq: Every day | ORAL | Status: DC
  Administered 2013-10-15: 5 mg via ORAL
  Filled 2013-10-15: qty 1

## 2013-10-15 MED ORDER — DEXTROSE-NACL 5-0.9 % IV SOLN
Freq: Once | INTRAVENOUS | Status: AC
  Administered 2013-10-15: 07:00:00 via INTRAVENOUS

## 2013-10-15 MED ORDER — HYDRALAZINE HCL 100 MG PO TABS: 100.0000 mg | ORAL_TABLET | Freq: Two times a day (BID) | ORAL | Status: DC

## 2013-10-15 MED ORDER — ONDANSETRON HCL 4 MG PO TABS: 4.0000 mg | ORAL_TABLET | Freq: Four times a day (QID) | ORAL | Status: DC | PRN

## 2013-10-15 MED ORDER — ADULT MULTIVITAMIN W/MINERALS CH
1.0000 | ORAL_TABLET | Freq: Every day | ORAL | Status: DC
  Filled 2013-10-15: qty 1

## 2013-10-15 MED ORDER — CLONIDINE HCL 0.1 MG PO TABS: 0.1000 mg | ORAL_TABLET | Freq: Two times a day (BID) | ORAL | Status: DC

## 2013-10-15 MED ORDER — LABETALOL HCL 300 MG PO TABS
300.0000 mg | ORAL_TABLET | Freq: Two times a day (BID) | ORAL | Status: DC
  Filled 2013-10-15 (×2): qty 1

## 2013-10-15 MED ORDER — SODIUM POLYSTYRENE SULFONATE 15 GM/60ML PO SUSP
30.0000 g | Freq: Once | ORAL | Status: AC
  Administered 2013-10-15: 30 g via ORAL
  Filled 2013-10-15: qty 120

## 2013-10-15 MED ORDER — GABAPENTIN 100 MG PO CAPS
100.0000 mg | ORAL_CAPSULE | Freq: Every evening | ORAL | Status: DC | PRN
  Filled 2013-10-15: qty 1

## 2013-10-15 MED ORDER — SODIUM CHLORIDE 0.9 % IJ SOLN: 3.0000 mL | Freq: Two times a day (BID) | INTRAMUSCULAR | Status: DC

## 2013-10-15 MED ORDER — ASPIRIN 81 MG PO CHEW
324.0000 mg | CHEWABLE_TABLET | Freq: Once | ORAL | Status: AC
  Administered 2013-10-15: 324 mg via ORAL
  Filled 2013-10-15: qty 4

## 2013-10-15 MED ORDER — HYDRALAZINE HCL 50 MG PO TABS
50.0000 mg | ORAL_TABLET | Freq: Three times a day (TID) | ORAL | Status: DC
  Filled 2013-10-15 (×3): qty 1

## 2013-10-15 MED ORDER — GUAIFENESIN-DM 100-10 MG/5ML PO SYRP
5.0000 mL | ORAL_SOLUTION | ORAL | Status: DC | PRN
  Filled 2013-10-15: qty 5

## 2013-10-15 MED ORDER — HEPARIN SODIUM (PORCINE) 5000 UNIT/ML IJ SOLN
5000.0000 [IU] | Freq: Three times a day (TID) | INTRAMUSCULAR | Status: DC
  Filled 2013-10-15 (×3): qty 1

## 2013-10-15 MED ORDER — DOXERCALCIFEROL 4 MCG/2ML IV SOLN
10.0000 ug | INTRAVENOUS | Status: DC
  Administered 2013-10-15: 10 ug via INTRAVENOUS

## 2013-10-15 MED ORDER — ALBUTEROL SULFATE (5 MG/ML) 0.5% IN NEBU
5.0000 mg | INHALATION_SOLUTION | Freq: Once | RESPIRATORY_TRACT | Status: AC
  Administered 2013-10-15: 5 mg via RESPIRATORY_TRACT
  Filled 2013-10-15: qty 1

## 2013-10-15 MED ORDER — HYDRALAZINE HCL 100 MG PO TABS: 50.0000 mg | ORAL_TABLET | Freq: Three times a day (TID) | ORAL | Status: DC

## 2013-10-15 MED ORDER — HYDROCODONE-ACETAMINOPHEN 5-325 MG PO TABS
ORAL_TABLET | ORAL | Status: AC
  Administered 2013-10-15: 2 via ORAL
  Filled 2013-10-15: qty 2

## 2013-10-15 MED ORDER — ALUM & MAG HYDROXIDE-SIMETH 200-200-20 MG/5ML PO SUSP
30.0000 mL | Freq: Four times a day (QID) | ORAL | Status: DC | PRN
  Filled 2013-10-15: qty 30

## 2013-10-15 MED ORDER — DARBEPOETIN ALFA-POLYSORBATE 25 MCG/0.42ML IJ SOLN
INTRAMUSCULAR | Status: AC
  Administered 2013-10-15: 12.5 ug via INTRAVENOUS
  Filled 2013-10-15: qty 0.42

## 2013-10-15 MED ORDER — INSULIN ASPART 100 UNIT/ML ~~LOC~~ SOLN
6.0000 [IU] | Freq: Once | SUBCUTANEOUS | Status: AC
  Administered 2013-10-15: 6 [IU] via INTRAVENOUS
  Filled 2013-10-15: qty 1

## 2013-10-15 MED ORDER — ALBUTEROL SULFATE (5 MG/ML) 0.5% IN NEBU: 2.5000 mg | INHALATION_SOLUTION | RESPIRATORY_TRACT | Status: DC | PRN

## 2013-10-15 MED ORDER — HYDRALAZINE HCL 20 MG/ML IJ SOLN
10.0000 mg | Freq: Four times a day (QID) | INTRAMUSCULAR | Status: DC | PRN
  Filled 2013-10-15: qty 0.5
  Filled 2013-10-15: qty 1

## 2013-10-15 MED ORDER — INSULIN ASPART 100 UNIT/ML IV SOLN: 6.0000 [IU] | Freq: Once | INTRAVENOUS | Status: DC

## 2013-10-15 MED ORDER — DARBEPOETIN ALFA-POLYSORBATE 25 MCG/0.42ML IJ SOLN
12.5000 ug | INTRAMUSCULAR | Status: DC
  Administered 2013-10-15: 12.5 ug via INTRAVENOUS

## 2013-10-15 NOTE — ED Notes (Signed)
MD at bedside. 

## 2013-10-15 NOTE — H&P (Signed)
[facility]                                                                                    Patient Demographics  [name] [name], is a 52 y.o. male  MRN: [id number]   DOB - 29-Nov-1961  Admit Date - 10/15/2013  Outpatient Primary MD for the patient is No primary provider on file.   With History of -  Past Medical History  Diagnosis Date  . Hypertension   . End stage renal disease     a. MWF dialysis  . Arthritis   . Polysubstance abuse     a. tobacco and cocaine  . Chest pain     a. negative MV in 2004  . Abnormal ECG   . LVH (left ventricular hypertrophy)     a. 01/2013 Echo: EF 60-65%, sev concentric LVH, nl wall motion, sev dil LA.      Past Surgical History  Procedure Laterality Date  . Av fistula placement      Left x 2  . Cholecystectomy    . Total knee arthroplasty  09/20/2012    Procedure: TOTAL KNEE ARTHROPLASTY;  Surgeon: [name], MD;  Location: [facility] OR;  Service: Orthopedics;  Laterality: Right;  right total knee arthroplasty  . Joint replacement Right 09/20/12    in for   Chief Complaint  Patient presents with  . Numbness     HPI  [name] [name]  is a 52 y.o. male, Hypertension, ESRD, left arm fistula for dialysis, Monday Wednesday Friday dialysis schedule patient has history of dialysis noncompliance, polysubstance and cocaine abuse, History of right total knee replacement in the past, cholecystectomy.  Patient with above history who has a history of noncompliance with his dialysis scheduled comes to the ER with nonspecific complaints of some exertional shortness of breath and tingling and numbness in both lower extremities, denies any weakness in his legs, no bowel bladder incontinence, no back pain or back injuries, he informs the ER staff that he missed his last dialysis on Monday due to transport issues, in the ER his blood work was consistent with hyperkalemia, elevated BUN/creatinine, mildly T waves on EKG and telemetry. Hospitalist team was  requested to admit the patient, renal was informed, patient received hyperkalemia protocol in the ER.     Review of Systems extremely noncooperative in review of systems and exam    In addition to the HPI above,   No Fever-chills, No Headache, No changes with Vision or hearing, No problems swallowing food or Liquids, No Chest pain, Cough , Does have mild Shortness of Breath, No Abdominal pain, No Nausea or Vommitting, Bowel movements are regular, No Blood in stool or Urine, No dysuria, No new skin rashes or bruises, No new joints pains-aches,  No new weakness, tingling, numbness in any extremity, Except some nonspecific numbness in both legs No recent weight gain or loss, No polyuria, polydypsia or polyphagia, No significant Mental Stressors.  A full 10 point Review of Systems was done, except as stated above, all other Review of Systems were negative.   Social History History  Substance Use Topics  . Smoking status: Current Every Day Smoker --  0.30 packs/day for 31 years    Types: Cigarettes  . Smokeless tobacco: Never Used     Comment: 2-4 cigarettes a day  . Alcohol Use: No      Family History Family History  Problem Relation Age of Onset  . Kidney failure Brother   . Hypertension Brother   . Kidney failure Brother   . Hypertension Mother   . Diabetes Mother   . Hypertension Father   . Hypertension Sister       Prior to Admission medications   Medication Sig Start Date End Date Taking? Authorizing Provider  amLODipine (NORVASC) 5 MG tablet Take 1 tablet (5 mg total) by mouth daily. 05/30/13  Yes Henderson Cloud, MD  calcium acetate (PHOSLO) 667 MG capsule Take 2,668 mg by mouth 3 (three) times daily with meals.   Yes Historical Provider, MD  gabapentin (NEURONTIN) 100 MG capsule Take 100 mg by mouth at bedtime as needed (nerve pain).    Yes Historical Provider, MD  labetalol (NORMODYNE) 300 MG tablet Take 300 mg by mouth 2 (two) times daily. Take 300  mg twice a day on non dialysis days. 02/04/13  Yes Rollene Rotunda, MD  Multiple Vitamins-Minerals (MULTIVITAMIN WITH MINERALS) tablet Take 1 tablet by mouth daily.   Yes Historical Provider, MD    No Known Allergies  Physical Exam  Vitals  Blood pressure 178/94, pulse 81, temperature 97.6 F (36.4 C), temperature source Oral, resp. rate 20, SpO2 97.00%.   1. General Middle-aged obese African American male lying in bed in NAD, Very noncooperative with exam   2. Normal affect and insight, Not Suicidal or Homicidal, Awake Alert, Oriented X 3.  3. No F.N deficits, ALL C.Nerves Intact, Strength 5/5 all 4 extremities, Sensation intact all 4 extremities, Plantars down going.  4. Ears and Eyes appear Normal, Conjunctivae clear, PERRLA. Moist Oral Mucosa.  5. Supple Neck, No JVD, No cervical lymphadenopathy appriciated, No Carotid Bruits.  6. Symmetrical Chest wall movement, Good air movement bilaterally, Crackles bilaterally in both bases  7. RRR, No Gallops, Rubs or Murmurs, No Parasternal Heave.  8. Positive Bowel Sounds, Abdomen Soft, Non tender, No organomegaly appriciated,No rebound -guarding or rigidity.  9.  No Cyanosis, Normal Skin Turgor, No Skin Rash or Bruise.1+ bilateral leg edema  10. Good muscle tone,  joints appear normal , no effusions, Normal ROM.  11. No Palpable Lymph Nodes in Neck or Axillae     Data Review  CBC  Recent Labs Lab 10/15/13 0455  WBC 7.9  HGB 10.4*  HCT 30.2*  PLT 206  MCV 86.5  MCH 29.8  MCHC 34.4  RDW 14.2  LYMPHSABS 1.4  MONOABS 0.5  EOSABS 0.2  BASOSABS 0.0   ------------------------------------------------------------------------------------------------------------------  Chemistries   Recent Labs Lab 10/15/13 0455  NA 135  K 7.3*  CL 92*  CO2 21  GLUCOSE 73  BUN 90*  CREATININE 16.38*  CALCIUM 9.8  AST 14  ALT 11  ALKPHOS 89  BILITOT 0.3    ------------------------------------------------------------------------------------------------------------------ CrCl is unknown because both a height and weight (above a minimum accepted value) are required for this calculation. ------------------------------------------------------------------------------------------------------------------ No results found for this basename: TSH, T4TOTAL, FREET3, T3FREE, THYROIDAB,  in the last 72 hours   Coagulation profile No results found for this basename: INR, PROTIME,  in the last 168 hours ------------------------------------------------------------------------------------------------------------------- No results found for this basename: DDIMER,  in the last 72 hours -------------------------------------------------------------------------------------------------------------------  Cardiac Enzymes No results found for this basename: CK, CKMB,  TROPONINI, MYOGLOBIN,  in the last 168 hours ------------------------------------------------------------------------------------------------------------------ No components found with this basename: POCBNP,    ----------------------------------------------------------------------------------------------------------------  Imaging results:   Dg Chest Port 1 View  10/15/2013   CLINICAL DATA:  Shortness of breath. Numbness.  EXAM: PORTABLE CHEST - 1 VIEW  COMPARISON:  09/09/2013  FINDINGS: Cardiac enlargement with normal pulmonary vascularity. No focal airspace disease in the lungs. No blunting of costophrenic angles. No pneumothorax. Mediastinal contours appear intact. Calcification of the aorta. No significant change since previous study.  IMPRESSION: Cardiac enlargement. No evidence of active pulmonary disease.   Electronically Signed   By: Burman Nieves M.D.   On: 10/15/2013 05:06    My personal review of EKG: Rhythm NSR, Rate 78 /min, mild peaked T waves, no Acute ST changes    Assessment &  Plan   1.Hyperkalemia in a patient with history of ESRD and recently missed dialysis. Long-standing history of noncompliance with his dialysis regimen, does have mildly peaked T waves, no bradycardia, no dysrhythmia. Hypokalemia protocol has been initiated in the ER. Renal has been consulted for urgent dialysis via left arm fistula. Patient has been counseled on compliance with dialysis, has been warned that missing dialysis can lead to death and disability, social work has been consulted to assist the patient with his transport needs.    2. Hypertension - home blood pressure medications which include Norvasc and labetalol will be continued, will add as needed IV hydralazine.     3. History of polysubstance abuse. Denies any recent cocaine abuse. Says last use was over one month ago. Counseled to continue abstaining.     4. Mild tingling or numbness in lower extremities. No weakness, no bowel bladder incontinence. Could be mild peripheral neuropathy. Outpatient workup with PCP.     5. Exertional shortness of breath and orthopnea. Due to mild fluid overload from missed dialysis, urgent dialysis today will be done renal has been informed. Oxygen nebulizer treatments as needed the       DVT Prophylaxis Heparin   AM Labs Ordered, also please review Full Orders  Family Communication: Admission, patients condition and plan of care including tests being ordered have been discussed with the patient who indicats understanding and agree with the plan and Code Status.  Code Status DNR  Likely DC to  Per [facility]  Condition GUARDED   Time spent in minutes :35    Marlin Brys K M.D on 10/15/2013 at 7:29 AM  Between 7am to 7pm - Pager - [phone]  After 7pm go to [email or web address] - password [email or web address]  And look for the night coverage person covering me after hours  [facility] Office  [phone]

## 2013-10-15 NOTE — Progress Notes (Signed)
10/15/2013 patient came from the emergency room to hemodialysis, then to 6710. Alert, orient and ambulatory, uses a cane. Patient was place on telemetry. Skin was dry, but refuse for me to look at sacrum. [facility] RN.

## 2013-10-15 NOTE — Progress Notes (Signed)
Utilization review completed.  

## 2013-10-15 NOTE — ED Notes (Signed)
Manly MD at bedside. 

## 2013-10-15 NOTE — ED Notes (Signed)
Pt request crackers. Delay explained

## 2013-10-15 NOTE — ED Provider Notes (Cosign Needed)
CSN: [id number]     Arrival date & time 10/14/13  2220 History   First MD Initiated Contact with Patient 10/15/13 0414     Chief Complaint  Patient presents with  . Numbness   (Consider location/radiation/quality/duration/timing/severity/associated sxs/prior Treatment) HPI  (PLEASE NOTE THAT THIS IS A LATE ENTRY) This patient is a middle aged man with ESRD, history of cocaine abuse, LVH, HTN and osteoarthritis. He presented to the ED, originally, because he had an episode during which he felt both of his LE were numb. This resolved without intervention but, the patient felt he should get checked out so he came to the ED. The episode occurred about 6h prior to presentation. No associated back pain, leg pain, fecal or urinary incontinence.   When I saw the patient, he chief complaint was actually shortness of breath. He noted that he had skipped his dialysis treatment the day prior to his ED visit because he could not get a ride. He was last dialyzed 4 days prior. He has orthopnea and says he feels most comfortable sitting up on the side of his bed. He denies chest pain. Says he has been compliant with meds. Admits to smoking crack 2 days ago. Patient says he makes small amounts of urine inconsistently.   Past Medical History  Diagnosis Date  . Hypertension   . End stage renal disease     a. MWF dialysis  . Arthritis   . Polysubstance abuse     a. tobacco and cocaine  . Chest pain     a. negative MV in 2004  . Abnormal ECG   . LVH (left ventricular hypertrophy)     a. 01/2013 Echo: EF 60-65%, sev concentric LVH, nl wall motion, sev dil LA.   Past Surgical History  Procedure Laterality Date  . Av fistula placement      Left x 2  . Cholecystectomy    . Total knee arthroplasty  09/20/2012    Procedure: TOTAL KNEE ARTHROPLASTY;  Surgeon: [name], MD;  Location: [facility] OR;  Service: Orthopedics;  Laterality: Right;  right total knee arthroplasty  . Joint replacement Right 09/20/12    Family History  Problem Relation Age of Onset  . Kidney failure Brother   . Hypertension Brother   . Kidney failure Brother   . Hypertension Mother   . Diabetes Mother   . Hypertension Father   . Hypertension Sister    History  Substance Use Topics  . Smoking status: Current Every Day Smoker -- 0.30 packs/day for 31 years    Types: Cigarettes  . Smokeless tobacco: Never Used     Comment: 2-4 cigarettes a day  . Alcohol Use: No    Review of Systems 10 POINT ROS PERFORMED AND IS NEGATIVE WITH THE EXCEPTION OF SX NOTED ABOVE.   Allergies  Review of patient's allergies indicates no known allergies.  Home Medications   Current Outpatient Rx  Name  Route  Sig  Dispense  Refill  . amLODipine (NORVASC) 5 MG tablet   Oral   Take 1 tablet (5 mg total) by mouth daily.   30 tablet   1   . calcium acetate (PHOSLO) 667 MG capsule   Oral   Take 2,668 mg by mouth 3 (three) times daily with meals.         . gabapentin (NEURONTIN) 100 MG capsule   Oral   Take 100 mg by mouth at bedtime as needed (nerve pain).          Marland Kitchen  labetalol (NORMODYNE) 300 MG tablet   Oral   Take 300 mg by mouth 2 (two) times daily. Take 300 mg twice a day on non dialysis days.         . Multiple Vitamins-Minerals (MULTIVITAMIN WITH MINERALS) tablet   Oral   Take 1 tablet by mouth daily.          BP 150/93  Pulse 65  Temp(Src) 97.6 F (36.4 C) (Oral)  Resp 14  SpO2 97% Physical Exam Gen: appears to be in respiratory distress, sitting in tripod position at bedside, diaphoretic, tachypneic, speaks 3-4 words between breaths.  Head: NCAT Eyes: conjunctiva injected bilaterally, PERLA, EOMI Mouth: normal to inspection Nose: normao to inspection Neck: normal to inspection, no stridor LUngs: RR 30s, BS diminished both bases, mild accessory muscle use CV: RRR, no murmur, ext well perfused Abd: obese, soft, nontender, nondistende3d Back: normal to inspection Ext: symmetric pitting edema  pretibial bilaterally, AV fistula in left arm has good thrill and is without erythema or ttp Skin: cool and diaphoretic Neuro: 5/5 strength in all ext, cn ii-xii grossly intact, GCS 15, normal speech, gait not assessed secondary to respiratory distress Psyche: mildly anxious but cooperative and appropriate.   ED Course  Procedures (including critical care time) Labs Review Labs Reviewed  CBC WITH DIFFERENTIAL - Abnormal; Notable for the following:    RBC 3.49 (*)    Hemoglobin 10.4 (*)    HCT 30.2 (*)    All other components within normal limits  POCT I-STAT TROPONIN I - Abnormal; Notable for the following:    Troponin i, poc 0.21 (*)    All other components within normal limits  COMPREHENSIVE METABOLIC PANEL  URINALYSIS, ROUTINE W REFLEX MICROSCOPIC   Imaging Review Dg Chest Port 1 View  10/15/2013   CLINICAL DATA:  Shortness of breath. Numbness.  EXAM: PORTABLE CHEST - 1 VIEW  COMPARISON:  09/09/2013  FINDINGS: Cardiac enlargement with normal pulmonary vascularity. No focal airspace disease in the lungs. No blunting of costophrenic angles. No pneumothorax. Mediastinal contours appear intact. Calcification of the aorta. No significant change since previous study.  IMPRESSION: Cardiac enlargement. No evidence of active pulmonary disease.   Electronically Signed   By: Burman Nieves M.D.   On: 10/15/2013 05:06    EKG Interpretation   None       MDM  Please note that this is a late entry.   The patient was noted to be in respiratory distress with hypertensive urgency secondary to volume overload.  Treated with hydralazine for afterload reduction. Found to be hyperkalemic without acute EKG changes and treated with calcium, albuterol, insulin & D 50 along with Kayexelate.  Case discussed with nephrologist on call who is arranging emergent dialysis. Case also discussed with hospitalist on call who will admit the patient.   Diagnosis, plan for admission and need for urgent dialysis  explained to the patient.   CRITICAL CARE Performed by: Brandt Loosen   Total critical care time: 59m.  Critical care time was exclusive of separately billable procedures and treating other patients.  Critical care was necessary to treat or prevent imminent or life-threatening deterioration.  Critical care was time spent personally by me on the following activities: development of treatment plan with patient and/or surrogate as well as nursing, discussions with consultants, evaluation of patient's response to treatment, examination of patient, obtaining history from patient or surrogate, ordering and performing treatments and interventions, ordering and review of laboratory studies, ordering and review of radiographic  studies, pulse oximetry and re-evaluation of patient's condition.     Brandt Loosen, MD 10/20/13 [phone]

## 2013-10-15 NOTE — ED Notes (Signed)
Dialysis pt on Monday, Wednesday, Friday. States he did not go to dialysis on Monday, due to being unable to find a ride.

## 2013-10-15 NOTE — ED Notes (Signed)
Nurse First Round ; Nurse explained delay / wait time and process to pt. Respirations unlabored / No distress.

## 2013-10-15 NOTE — Discharge Summary (Signed)
[facility]                                                                                   [name] [name], is a 52 y.o. male  DOB March 13, 1961  MRN [id number].  Admission date:  10/15/2013  Admitting Physician  Houston Siren, MD  Discharge Date:  10/15/2013   Primary MD  No primary provider on file.  Recommendations for primary care physician for things to follow:   Please monitor blood pressure closely and adjust medications as needed   Admission Diagnosis  End stage renal disease [585.6] Hyperkalemia [276.7] Cocaine abuse [305.60] HTN (hypertension) [401.9] Polysubstance abuse [305.90] Volume overload [276.69] Fluid overload [276.69]  Discharge Diagnosis   Hyperkalemia due to missed Dialysis  Principal Problem:   Hyperkalemia Active Problems:   S/P right TKA   HTN (hypertension)   End stage renal disease   Polysubstance abuse   Fluid overload   Noncompliance of patient with renal dialysis      Past Medical History  Diagnosis Date  . Hypertension   . End stage renal disease     a. MWF dialysis  . Arthritis   . Polysubstance abuse     a. tobacco and cocaine  . Chest pain     a. negative MV in 2004  . Abnormal ECG   . LVH (left ventricular hypertrophy)     a. 01/2013 Echo: EF 60-65%, sev concentric LVH, nl wall motion, sev dil LA.  Marland Kitchen Hyperkalemia 10/15/2013    Past Surgical History  Procedure Laterality Date  . Av fistula placement      Left x 2  . Cholecystectomy    . Total knee arthroplasty  09/20/2012    Procedure: TOTAL KNEE ARTHROPLASTY;  Surgeon: [name], MD;  Location: [facility] OR;  Service: Orthopedics;  Laterality: Right;  right total knee arthroplasty  . Joint replacement Right 09/20/12     Discharge Condition: Stable       Follow-up Information   Follow up with follow with your Dialysis clinic M,W,F as before.        Consults obtained - Renal   Discharge Medications      Medication List    STOP taking these medications        labetalol 300 MG tablet  Commonly known as:  NORMODYNE      TAKE these medications       amLODipine 5 MG tablet  Commonly known as:  NORVASC  Take 2 tablets (10 mg total) by mouth daily.     calcium acetate 667 MG capsule  Commonly known as:  PHOSLO  Take 2,668 mg by mouth 3 (three) times daily with meals.     cloNIDine 0.1 MG tablet  Commonly known as:  CATAPRES  Take 1 tablet (0.1 mg total) by mouth 2 (two) times daily.     gabapentin 100 MG capsule  Commonly known as:  NEURONTIN  Take 100 mg by mouth at bedtime as needed (nerve pain).     hydrALAZINE 100 MG tablet  Commonly known as:  APRESOLINE  Take 0.5 tablets (50 mg total) by mouth 3 (three) times daily.  multivitamin with minerals tablet  Take 1 tablet by mouth daily.         Diet and Activity recommendation: See Discharge Instructions below   Discharge Instructions     Follow with Primary MD in 7 days   Get CBC, CMP, checked 7 days by Primary MD and again as instructed by your Primary MD.   Get Medicines reviewed and adjusted.  Please request your Prim.MD to go over all Hospital Tests and Procedure/Radiological results at the follow up, please get all Hospital records sent to your Prim MD by signing hospital release before you go home.  Activity: As tolerated with Full fall precautions use walker/cane & assistance as needed   Diet:  Renal   Check your Weight same time everyday, if you gain over 2 pounds, or you develop in leg swelling, experience more shortness of breath or chest pain, call your Primary MD immediately. Follow Cardiac Low Salt Diet and 1.2 lit/day fluid restriction.  Disposition Home   If you experience worsening of your admission symptoms, develop shortness of breath, life threatening emergency, suicidal or homicidal thoughts you must seek medical attention immediately by calling 911 or calling your MD immediately  if symptoms less severe.  You Must read complete  instructions/literature along with all the possible adverse reactions/side effects for all the Medicines you take and that have been prescribed to you. Take any new Medicines after you have completely understood and accpet all the possible adverse reactions/side effects.   Do not drive and provide baby sitting services if your were admitted for syncope or siezures until you have seen by Primary MD or a Neurologist and advised to do so again.  Do not drive when taking Pain medications.    Do not take more than prescribed Pain, Sleep and Anxiety Medications  Special Instructions: If you have smoked or chewed Tobacco  in the last 2 yrs please stop smoking, stop any regular Alcohol  and or any Recreational drug use.  Wear Seat belts while driving.   Please note  You were cared for by a hospitalist during your hospital stay. If you have any questions about your discharge medications or the care you received while you were in the hospital after you are discharged, you can call the unit and asked to speak with the hospitalist on call if the hospitalist that took care of you is not available. Once you are discharged, your primary care physician will handle any further medical issues. Please note that NO REFILLS for any discharge medications will be authorized once you are discharged, as it is imperative that you return to your primary care physician (or establish a relationship with a primary care physician if you do not have one) for your aftercare needs so that they can reassess your need for medications and monitor your lab values.  Major procedures and Radiology Reports - PLEASE review detailed and final reports for all details, in brief -       Dg Chest Port 1 View  10/15/2013   CLINICAL DATA:  Shortness of breath. Numbness.  EXAM: PORTABLE CHEST - 1 VIEW  COMPARISON:  09/09/2013  FINDINGS: Cardiac enlargement with normal pulmonary vascularity. No focal airspace disease in the lungs. No  blunting of costophrenic angles. No pneumothorax. Mediastinal contours appear intact. Calcification of the aorta. No significant change since previous study.  IMPRESSION: Cardiac enlargement. No evidence of active pulmonary disease.   Electronically Signed   By: Burman Nieves M.D.   On: 10/15/2013  05:06    Micro Results      No results found for this or any previous visit (from the past 240 hour(s)).   History of present illness and  Hospital Course:     Kindly see H&P for history of present illness and admission details, please review complete Labs, Consult reports and Test reports for all details in brief [name] [name], is a 52 y.o. male, patient with history of  ESRD, noncompliance with dialysis, polysubstance abuse including cocaine abuse counseled quit cocaine , right total knee replacement, cholecystectomy, was admitted to the hospital with nonspecific complaints including shortness of breath and orthopnea secondary to fluid overload due to missed dialysis. Apparently patient chooses to indulge in cocaine abuse on a frequent basis and hands Misses his dialysis trips. He was seen by renal underwent hemodialysis without any issues, discussed his case with nephrologist on call Dr.Schertz, he stable for home discharge. For his hypertension I have discontinued his labetalol due to ongoing cocaine abuse and I have switched him to combination of Norvasc, hydralazine and Catapres.         Today   Subjective:   [name] [name] today has no headache,no chest abdominal pain,no new weakness tingling or numbness, feels much better wants to go home today.    Objective:   Blood pressure 154/94, pulse 96, temperature 98.2 F (36.8 C), temperature source Oral, resp. rate 18, weight 147.4 kg (324 lb 15.3 oz), SpO2 96.00%.   Intake/Output Summary (Last 24 hours) at 10/15/13 1440 Last data filed at 10/15/13 1217  Gross per 24 hour  Intake    110 ml  Output   5920 ml  Net  -5810 ml     Exam Awake Alert, Oriented *3, No new F.N deficits, Normal affect [place].AT,PERRAL Supple Neck,No JVD, No cervical lymphadenopathy appriciated.  Symmetrical Chest wall movement, Good air movement bilaterally, CTAB RRR,No Gallops,Rubs or new Murmurs, No Parasternal Heave +ve B.Sounds, Abd Soft, Non tender, No organomegaly appriciated, No rebound -guarding or rigidity. No Cyanosis, Clubbing or edema, No new Rash or bruise  Data Review   CBC w Diff: Lab Results  Component Value Date   WBC 7.9 10/15/2013   HGB 10.4* 10/15/2013   HCT 30.2* 10/15/2013   PLT 206 10/15/2013   LYMPHOPCT 18 10/15/2013   MONOPCT 6 10/15/2013   EOSPCT 2 10/15/2013   BASOPCT 0 10/15/2013    CMP: Lab Results  Component Value Date   NA 137 10/15/2013   K 5.9* 10/15/2013   CL 92* 10/15/2013   CO2 22 10/15/2013   BUN 88* 10/15/2013   CREATININE 16.90* 10/15/2013   PROT 7.7 10/15/2013   ALBUMIN 3.7 10/15/2013   BILITOT 0.3 10/15/2013   ALKPHOS 89 10/15/2013   AST 14 10/15/2013   ALT 11 10/15/2013  .   Total Time in preparing paper work, data evaluation and todays exam - 35 minutes  Leroy Sea M.D on 10/15/2013 at 2:40 PM  [facility] Office  [phone]

## 2013-10-15 NOTE — Consult Note (Signed)
[facility] Renal Consultation Note    Indication for Consultation:  Management of ESRD/hemodialysis; anemia, hypertension/volume and secondary hyperparathyroidism  HPI: [name] [name] is a 52 y.o. male.with ESRD on MWF dialysis at [facility] who missed  Monday dialysis this week and only attended one dialysis treatment last week.  His post HD weight was 2 kg above his EDW last Friday when he ran his full treatment and had a net UF of 9.1 L!!!  Amidst dozing off on dialysis, he tells me he came to the ED because his legs were giving out.  Past Medical History  Diagnosis Date  . Hypertension   . End stage renal disease     a. MWF dialysis  . Arthritis   . Polysubstance abuse     a. tobacco and cocaine  . Chest pain     a. negative MV in 2004  . Abnormal ECG   . LVH (left ventricular hypertrophy)     a. 01/2013 Echo: EF 60-65%, sev concentric LVH, nl wall motion, sev dil LA.   Past Surgical History  Procedure Laterality Date  . Av fistula placement      Left x 2  . Cholecystectomy    . Total knee arthroplasty  09/20/2012    Procedure: TOTAL KNEE ARTHROPLASTY;  Surgeon: [name], MD;  Location: [facility] OR;  Service: Orthopedics;  Laterality: Right;  right total knee arthroplasty  . Joint replacement Right 09/20/12   Family History  Problem Relation Age of Onset  . Kidney failure Brother   . Hypertension Brother   . Kidney failure Brother   . Hypertension Mother   . Diabetes Mother   . Hypertension Father   . Hypertension Sister    Social History:  reports that he has been smoking Cigarettes.  He has a 9.3 pack-year smoking history. He has never used smokeless tobacco. He reports that he uses illicit drugs (Cocaine). He reports that he does not drink alcohol. No Known Allergies Prior to Admission medications   Medication Sig Start Date End Date Taking? Authorizing Provider  amLODipine (NORVASC) 5 MG tablet Take 1 tablet (5 mg total) by mouth daily. 05/30/13  Yes Henderson Cloud, MD  calcium acetate (PHOSLO) 667 MG capsule Take 2,668 mg by mouth 3 (three) times daily with meals.   Yes Historical Provider, MD  gabapentin (NEURONTIN) 100 MG capsule Take 100 mg by mouth at bedtime as needed (nerve pain).    Yes Historical Provider, MD  labetalol (NORMODYNE) 300 MG tablet Take 300 mg by mouth 2 (two) times daily. Take 300 mg twice a day on non dialysis days. 02/04/13  Yes Rollene Rotunda, MD  Multiple Vitamins-Minerals (MULTIVITAMIN WITH MINERALS) tablet Take 1 tablet by mouth daily.   Yes Historical Provider, MD   No current facility-administered medications for this encounter.   Labs: Basic Metabolic Panel:  Recent Labs Lab 10/15/13 0455 10/15/13 0805  NA 135 137  K 7.3* 5.9*  CL 92* 92*  CO2 21 22  GLUCOSE 73 47*  BUN 90* 88*  CREATININE 16.38* 16.90*  CALCIUM 9.8 10.0   Liver Function Tests:  Recent Labs Lab 10/15/13 0455  AST 14  ALT 11  ALKPHOS 89  BILITOT 0.3  PROT 7.7  ALBUMIN 3.7  CBC:  Recent Labs Lab 10/15/13 0455  WBC 7.9  NEUTROABS 5.8  HGB 10.4*  HCT 30.2*  MCV 86.5  PLT 206  Studies/Results: Dg Chest Port 1 View  10/15/2013   CLINICAL  DATA:  Shortness of breath. Numbness.  EXAM: PORTABLE CHEST - 1 VIEW  COMPARISON:  09/09/2013  FINDINGS: Cardiac enlargement with normal pulmonary vascularity. No focal airspace disease in the lungs. No blunting of costophrenic angles. No pneumothorax. Mediastinal contours appear intact. Calcification of the aorta. No significant change since previous study.  IMPRESSION: Cardiac enlargement. No evidence of active pulmonary disease.   Electronically Signed   By: Burman Nieves M.D.   On: 10/15/2013 05:06   ROS: As per HPI - limited due to the fact he keeps falling asleep during my conversation.  Physical Exam: Filed Vitals:   10/15/13 0745 10/15/13 0800 10/15/13 0830 10/15/13 0900  BP: 173/91 143/95 189/104 154/95  Pulse: 84 75 76 78  Temp:      TempSrc:      Resp: 17 13 16  25   SpO2: 94% 95% 100% 99%     General: Well developed, well nourished, in no acute distress.wearing O2 snoring  Head: Normocephalic, atraumatic, sclera non-icteric, mucus membranes are moist Neck: Supple. JVD not elevated. Lungs: Clear bilaterally to auscultation without wheezes, rales, or rhonchi. Breathing is unlabored. Heart: RRR Abdomen: Soft, non-tender, non-distended with normoactive bowel sounds. No rebound/guarding. . M-S:  Strength and tone appear normal for age. Lower extremities:without edema or ischemic changes, no open wounds  Neuro: Alert and oriented X 3. Moves all extremities spontaneously. Psych:  Responds to questions appropriately with a normal affect. Dialysis Access:left AVF Qb 450  Dialysis Orders: MWF @ [facility] 200 4:30 140.5 kg 2K/2Ca 500/800 Heparin 9000-U bolus, 4000 U mid-HD AVF @ LUA  Hectorol 10 mcg Epogen 1200 U Venofer 50 mg on Weds Recent labs:  iPTH 1250, Ca 9.3 P 5.4 Hgb 10.3  Assessment/Plan: 1. Hyperkalemia - secondary to missed tmt on Monday and only came 1/3 tmts last week;  Tried to discuss the fact this level of K was life threatening, but he kept falling asleep changed to 2 K bath for the remainder of his treatment 2. ESRD -  MWF - usual orders; can't get AVF up to Qb 500 due to VP today 3. Hypertension/volume  - no pre weight goal 6.5 today; CXR neg; volume control of BP- has variable med compliance due to unstable living situation/ lifestyle; needs post HD weight  4. Anemia  -Hgb stable ESA dose x 1 5. Metabolic bone disease -  Continue hectorol 10, binders 6. Nutrition - needs high pro renal diet 7. Cocaine abuse - contributory to noncompliance with dialysis 8. Homelessness - has been recently - dozing too much to get current history  Sheffield Slider, Cordelia Poche [facility] Beeper [phone] 10/15/2013, 9:23 AM    Patient seen and examined.  Agree with assessment and plan as above. Vinson Moselle  MD Pager [phone]    Cell   [phone] 10/15/2013, 12:20 PM

## 2013-10-15 NOTE — ED Notes (Signed)
Lab called and notify floor that patient k--7.3, notified dr. Lavella Lemons of lab results

## 2013-10-16 NOTE — Procedures (Signed)
I was present at this dialysis session. I have reviewed the session itself and made appropriate changes.   [name] Moselle  MD Pager [phone]    Cell  [phone] 10/15/2013, 8:50 AM

## 2013-10-17 LAB — MRSA CULTURE

## 2013-10-30 ENCOUNTER — Emergency Department (HOSPITAL_COMMUNITY)
Admission: EM | Admit: 2013-10-30 | Discharge: 2013-10-30 | Disposition: A | Discharge status: Home / Self Care | Payer: Medicare Other | Attending: Emergency Medicine | Admitting: Emergency Medicine

## 2013-10-30 ENCOUNTER — Encounter (HOSPITAL_COMMUNITY): Payer: Self-pay | Admitting: Emergency Medicine

## 2013-10-30 DIAGNOSIS — F17200 Nicotine dependence, unspecified, uncomplicated: Secondary | ICD-10-CM | POA: Insufficient documentation

## 2013-10-30 DIAGNOSIS — Z79899 Other long term (current) drug therapy: Secondary | ICD-10-CM | POA: Insufficient documentation

## 2013-10-30 DIAGNOSIS — I120 Hypertensive chronic kidney disease with stage 5 chronic kidney disease or end stage renal disease: Secondary | ICD-10-CM | POA: Insufficient documentation

## 2013-10-30 DIAGNOSIS — R609 Edema, unspecified: Secondary | ICD-10-CM | POA: Insufficient documentation

## 2013-10-30 DIAGNOSIS — E669 Obesity, unspecified: Secondary | ICD-10-CM | POA: Insufficient documentation

## 2013-10-30 DIAGNOSIS — N186 End stage renal disease: Secondary | ICD-10-CM | POA: Insufficient documentation

## 2013-10-30 DIAGNOSIS — I517 Cardiomegaly: Secondary | ICD-10-CM | POA: Insufficient documentation

## 2013-10-30 DIAGNOSIS — B372 Candidiasis of skin and nail: Secondary | ICD-10-CM | POA: Insufficient documentation

## 2013-10-30 DIAGNOSIS — Z8679 Personal history of other diseases of the circulatory system: Secondary | ICD-10-CM | POA: Insufficient documentation

## 2013-10-30 DIAGNOSIS — Z992 Dependence on renal dialysis: Secondary | ICD-10-CM | POA: Insufficient documentation

## 2013-10-30 DIAGNOSIS — F1910 Other psychoactive substance abuse, uncomplicated: Secondary | ICD-10-CM | POA: Insufficient documentation

## 2013-10-30 DIAGNOSIS — M129 Arthropathy, unspecified: Secondary | ICD-10-CM | POA: Insufficient documentation

## 2013-10-30 MED ORDER — CEPHALEXIN 500 MG PO CAPS: 500.0000 mg | ORAL_CAPSULE | Freq: Four times a day (QID) | ORAL | Status: DC

## 2013-10-30 MED ORDER — NYSTATIN 100000 UNIT/GM EX POWD: Freq: Three times a day (TID) | CUTANEOUS | Status: DC

## 2013-10-30 NOTE — ED Notes (Signed)
Pt c/o rash in skin fold in right groin area; pt sts some drainage and foul odor

## 2013-10-30 NOTE — ED Provider Notes (Signed)
CSN: [id number]     Arrival date & time 10/30/13  1429 History   First MD Initiated Contact with Patient 10/30/13 1623     Chief Complaint  Patient presents with  . Rash   (Consider location/radiation/quality/duration/timing/severity/associated sxs/prior Treatment) HPI  This is a 52 year old male with a history of hypertension, end-stage renal disease with dialysis on Monday Wednesday Friday who presents with a rash. Patient reports 10 days of increasing rash and discomfort under the left side of his pannus. Over last 2-3 days he's noted that the skin has opened up some and there may be some drainage there. He denies any fevers. He denies any other symptoms.  He does not make urine and denies any rash in his perineal area.   Past Medical History  Diagnosis Date  . Hypertension   . End stage renal disease     a. MWF dialysis  . Arthritis   . Polysubstance abuse     a. tobacco and cocaine  . Chest pain     a. negative MV in 2004  . Abnormal ECG   . LVH (left ventricular hypertrophy)     a. 01/2013 Echo: EF 60-65%, sev concentric LVH, nl wall motion, sev dil LA.  Marland Kitchen Hyperkalemia 10/15/2013   Past Surgical History  Procedure Laterality Date  . Av fistula placement      Left x 2  . Cholecystectomy    . Total knee arthroplasty  09/20/2012    Procedure: TOTAL KNEE ARTHROPLASTY;  Surgeon: [name], MD;  Location: [facility] OR;  Service: Orthopedics;  Laterality: Right;  right total knee arthroplasty  . Joint replacement Right 09/20/12   Family History  Problem Relation Age of Onset  . Kidney failure Brother   . Hypertension Brother   . Kidney failure Brother   . Hypertension Mother   . Diabetes Mother   . Hypertension Father   . Hypertension Sister    History  Substance Use Topics  . Smoking status: Current Every Day Smoker -- 0.30 packs/day for 31 years    Types: Cigarettes  . Smokeless tobacco: Never Used     Comment: 2-4 cigarettes a day  . Alcohol Use: No    Review of  Systems  Constitutional: Negative for fever.  Respiratory: Negative for cough and shortness of breath.   Cardiovascular: Positive for leg swelling. Negative for chest pain.  Gastrointestinal: Negative for nausea, vomiting and abdominal pain.  Skin: Positive for rash.  Neurological: Negative for headaches.  All other systems reviewed and are negative.    Allergies  Review of patient's allergies indicates no known allergies.  Home Medications   Current Outpatient Rx  Name  Route  Sig  Dispense  Refill  . amLODipine (NORVASC) 5 MG tablet   Oral   Take 5 mg by mouth daily.         . calcium acetate (PHOSLO) 667 MG capsule   Oral   Take 2,668 mg by mouth 3 (three) times daily with meals.         . cinacalcet (SENSIPAR) 90 MG tablet   Oral   Take 90 mg by mouth daily.         . cloNIDine (CATAPRES) 0.1 MG tablet   Oral   Take 0.1 mg by mouth 2 (two) times daily.         Marland Kitchen gabapentin (NEURONTIN) 100 MG capsule   Oral   Take 100 mg by mouth at bedtime as needed (nerve pain).          Marland Kitchen  Multiple Vitamins-Minerals (MULTIVITAMIN WITH MINERALS) tablet   Oral   Take 1 tablet by mouth daily.         . cephALEXin (KEFLEX) 500 MG capsule   Oral   Take 1 capsule (500 mg total) by mouth 4 (four) times daily.   28 capsule   0   . nystatin (MYCOSTATIN) powder   Topical   Apply topically 3 (three) times daily.   15 g   0    BP 180/92  Pulse 83  Temp(Src) 98.2 F (36.8 C) (Oral)  Resp 18  SpO2 95% Physical Exam  Nursing note and vitals reviewed. Constitutional: He is oriented to person, place, and time.  Obese  HENT:  Head: Normocephalic and atraumatic.  Eyes: Pupils are equal, round, and reactive to light.  Cardiovascular: Normal rate, regular rhythm and normal heart sounds.   No murmur heard. Pulmonary/Chest: Effort normal and breath sounds normal. No respiratory distress. He has no wheezes.  Abdominal: Soft. There is no tenderness.  Genitourinary:  Penis normal.  No evidence of rash or crepitus in the perineal area  Musculoskeletal: He exhibits no edema.  2+ bilateral lower extremity edema  Lymphadenopathy:    He has no cervical adenopathy.  Neurological: He is alert and oriented to person, place, and time.  Skin: Skin is warm and dry. Rash noted.  Examination of the patient's pain is reveals rash with central clearing under the left pannus. There is overlying excoriation and redness. No purulent drainage noted.  Psychiatric: He has a normal mood and affect.    ED Course  Procedures (including critical care time) Labs Review Labs Reviewed - No data to display Imaging Review No results found.  EKG Interpretation   None       MDM   1. Candidiasis of skin     This is a 52 year old male who presents with rash. He is nontoxic-appearing on exam. Vital signs are notable for a blood pressure of 180/92. He denies any chest pain, shortness of breath, or headache. Rash as noted under his left pannus which is most consistent with yeast. Patient also has some overlying excoriation and redness which could be early bacterial superinfection. Patient will be given nystatin powder as well as Keflex for superinfection. Patient has no evidence of rash or crepitus in his groin concerning for Fournier's.  Patient is hypertensive and does have bilateral lower [place] swelling but is due for dialysis tomorrow. Patient states this is normal for him prior to dialysis.  After history, exam, and medical workup I feel the patient has been appropriately medically screened and is safe for discharge home. Pertinent diagnoses were discussed with the patient. Patient was given return precautions.    Shon Baton, MD 10/30/13 [phone]

## 2013-10-30 NOTE — ED Notes (Signed)
Pt discharged.Vital signs sable and GCS 15.Discharge instruction given.

## 2013-12-02 ENCOUNTER — Encounter (HOSPITAL_COMMUNITY): Payer: Self-pay | Admitting: Emergency Medicine

## 2013-12-02 ENCOUNTER — Emergency Department (HOSPITAL_COMMUNITY): Payer: Medicare Other

## 2013-12-02 ENCOUNTER — Inpatient Hospital Stay (HOSPITAL_COMMUNITY)
Admission: EM | Admit: 2013-12-02 | Discharge: 2013-12-03 | DRG: 640 | Disposition: A | Discharge status: Home / Self Care | Payer: Medicare Other | Attending: Internal Medicine | Admitting: Internal Medicine

## 2013-12-02 DIAGNOSIS — M129 Arthropathy, unspecified: Secondary | ICD-10-CM | POA: Diagnosis present

## 2013-12-02 DIAGNOSIS — Z91158 Patient's noncompliance with renal dialysis for other reason: Secondary | ICD-10-CM

## 2013-12-02 DIAGNOSIS — Z9115 Patient's noncompliance with renal dialysis: Secondary | ICD-10-CM

## 2013-12-02 DIAGNOSIS — I10 Essential (primary) hypertension: Secondary | ICD-10-CM

## 2013-12-02 DIAGNOSIS — F1910 Other psychoactive substance abuse, uncomplicated: Secondary | ICD-10-CM

## 2013-12-02 DIAGNOSIS — N186 End stage renal disease: Secondary | ICD-10-CM | POA: Diagnosis present

## 2013-12-02 DIAGNOSIS — F1410 Cocaine abuse, uncomplicated: Secondary | ICD-10-CM | POA: Diagnosis present

## 2013-12-02 DIAGNOSIS — I509 Heart failure, unspecified: Secondary | ICD-10-CM | POA: Diagnosis present

## 2013-12-02 DIAGNOSIS — E8770 Fluid overload, unspecified: Secondary | ICD-10-CM

## 2013-12-02 DIAGNOSIS — R0602 Shortness of breath: Secondary | ICD-10-CM

## 2013-12-02 DIAGNOSIS — I120 Hypertensive chronic kidney disease with stage 5 chronic kidney disease or end stage renal disease: Secondary | ICD-10-CM | POA: Diagnosis present

## 2013-12-02 DIAGNOSIS — Z833 Family history of diabetes mellitus: Secondary | ICD-10-CM

## 2013-12-02 DIAGNOSIS — E875 Hyperkalemia: Principal | ICD-10-CM | POA: Diagnosis present

## 2013-12-02 DIAGNOSIS — F17200 Nicotine dependence, unspecified, uncomplicated: Secondary | ICD-10-CM | POA: Diagnosis present

## 2013-12-02 DIAGNOSIS — M6281 Muscle weakness (generalized): Secondary | ICD-10-CM | POA: Diagnosis present

## 2013-12-02 DIAGNOSIS — Z96659 Presence of unspecified artificial knee joint: Secondary | ICD-10-CM

## 2013-12-02 DIAGNOSIS — R079 Chest pain, unspecified: Secondary | ICD-10-CM

## 2013-12-02 DIAGNOSIS — I5032 Chronic diastolic (congestive) heart failure: Secondary | ICD-10-CM | POA: Diagnosis present

## 2013-12-02 DIAGNOSIS — Z8249 Family history of ischemic heart disease and other diseases of the circulatory system: Secondary | ICD-10-CM

## 2013-12-02 LAB — BASIC METABOLIC PANEL
CO2: 19 mEq/L (ref 19–32)
Chloride: 93 mEq/L — ABNORMAL LOW (ref 96–112)
Creatinine, Ser: 13.92 mg/dL — ABNORMAL HIGH (ref 0.50–1.35)
GFR calc Af Amer: 4 mL/min — ABNORMAL LOW (ref >90)
GFR calc non Af Amer: 3 mL/min — ABNORMAL LOW (ref >90)
Potassium: >7.5 mEq/L (ref 3.5–5.1)
Sodium: 132 mEq/L — ABNORMAL LOW (ref 135–145)

## 2013-12-02 LAB — CBC
Hemoglobin: 11.1 g/dL — ABNORMAL LOW (ref 13.0–17.0)
MCHC: 34 g/dL (ref 30.0–36.0)
Platelets: 204 10*3/uL (ref 150–400)
RBC: 3.75 MIL/uL — ABNORMAL LOW (ref 4.22–5.81)
RDW: 14.9 % (ref 11.5–15.5)
WBC: 8.9 10*3/uL (ref 4.0–10.5)

## 2013-12-02 LAB — PRO B NATRIURETIC PEPTIDE: Pro B Natriuretic peptide (BNP): 3892 pg/mL — ABNORMAL HIGH (ref 0–125)

## 2013-12-02 LAB — POCT I-STAT TROPONIN I: Troponin i, poc: 0.06 ng/mL (ref 0.00–0.08)

## 2013-12-02 MED ORDER — SEVELAMER CARBONATE 800 MG PO TABS
800.0000 mg | ORAL_TABLET | Freq: Three times a day (TID) | ORAL | Status: DC
  Administered 2013-12-03: 800 mg via ORAL
  Filled 2013-12-02 (×4): qty 1

## 2013-12-02 MED ORDER — AMLODIPINE BESYLATE 5 MG PO TABS
5.0000 mg | ORAL_TABLET | Freq: Every day | ORAL | Status: DC
  Filled 2013-12-02: qty 1

## 2013-12-02 MED ORDER — SODIUM POLYSTYRENE SULFONATE 15 GM/60ML PO SUSP
30.0000 g | Freq: Once | ORAL | Status: DC
  Filled 2013-12-02: qty 120

## 2013-12-02 MED ORDER — HEPARIN SODIUM (PORCINE) 5000 UNIT/ML IJ SOLN
5000.0000 [IU] | Freq: Three times a day (TID) | INTRAMUSCULAR | Status: DC
  Administered 2013-12-03: 5000 [IU] via SUBCUTANEOUS
  Filled 2013-12-02 (×4): qty 1

## 2013-12-02 MED ORDER — INSULIN ASPART 100 UNIT/ML ~~LOC~~ SOLN
5.0000 [IU] | Freq: Once | SUBCUTANEOUS | Status: DC
  Filled 2013-12-02: qty 1

## 2013-12-02 MED ORDER — SODIUM CHLORIDE 0.9 % IJ SOLN: 3.0000 mL | Freq: Two times a day (BID) | INTRAMUSCULAR | Status: DC

## 2013-12-02 MED ORDER — MULTI-VITAMIN/MINERALS PO TABS
1.0000 | ORAL_TABLET | Freq: Every day | ORAL | Status: DC
Start: 2013-12-03 — End: 2013-12-03
  Administered 2013-12-03: 1 via ORAL
  Filled 2013-12-02: qty 1

## 2013-12-02 MED ORDER — CINACALCET HCL 30 MG PO TABS
90.0000 mg | ORAL_TABLET | Freq: Every day | ORAL | Status: DC
  Administered 2013-12-03: 90 mg via ORAL
  Filled 2013-12-02 (×2): qty 3

## 2013-12-02 MED ORDER — CLONIDINE HCL 0.1 MG PO TABS
0.1000 mg | ORAL_TABLET | Freq: Two times a day (BID) | ORAL | Status: DC
  Filled 2013-12-02 (×2): qty 1

## 2013-12-02 MED ORDER — DEXTROSE 50 % IV SOLN
1.0000 | Freq: Once | INTRAVENOUS | Status: DC
  Filled 2013-12-02: qty 50

## 2013-12-02 MED ORDER — SODIUM CHLORIDE 0.9 % IV SOLN
1.0000 g | Freq: Once | INTRAVENOUS | Status: AC
  Administered 2013-12-02: 1 g via INTRAVENOUS
  Filled 2013-12-02 (×2): qty 10

## 2013-12-02 NOTE — ED Notes (Signed)
Dialysis called states are ready for pt. Dr. Arlie Solomons made aware states hold all medications except Calcium gluconate. Pharmacist contacted to expedite medication delivery

## 2013-12-02 NOTE — Procedures (Signed)
Tolerating emergency dialysis for hyperkalemia. His leg weakness has already improved.  He will need another HD tx in AM.  He has a large aneurysm of AVF LUE. Vanessia Bokhari C

## 2013-12-02 NOTE — ED Notes (Signed)
Pt wants to wait until he gets to a room and gets an IV to get blood work taken

## 2013-12-02 NOTE — ED Notes (Signed)
MD at Bedside.

## 2013-12-02 NOTE — ED Notes (Addendum)
CRITICAL POTASSIUM >7.5 Dr. Jeraldine Loots made aware

## 2013-12-02 NOTE — ED Provider Notes (Signed)
CSN: [id number]     Arrival date & time 12/02/13  1754 History   First MD Initiated Contact with Patient 12/02/13 2059     Chief Complaint  Patient presents with  . Chest Pain  . Shortness of Breath   (Consider location/radiation/quality/duration/timing/severity/associated sxs/prior Treatment) HPI Chief complaint: Shortness of breath leg weakness  Patient is a 52 year old male past medical history significant for hypertension ESRD on dialysis Monday Wednesday Friday who comes in chief complaint of leg weakness and shortness of breath. Patient states she's not on dialysis for 5 days. Has missed one session. For the past 24 hours patient is been feeling short of breath. Similar to when he misses dialysis sessions. States this is constant and worsening. Worse with exertion. Is currently mild in severity. Patient denies chest pain. No other associated symptoms. patient also complaining of bilateral lower tremor he weakness. States his muscles feel achy. Feels though he may fall. Denies any falls.  Past Medical History  Diagnosis Date  . Hypertension   . End stage renal disease     a. MWF dialysis  . Arthritis   . Polysubstance abuse     a. tobacco and cocaine  . Chest pain     a. negative MV in 2004  . Abnormal ECG   . LVH (left ventricular hypertrophy)     a. 01/2013 Echo: EF 60-65%, sev concentric LVH, nl wall motion, sev dil LA.  Marland Kitchen Hyperkalemia 10/15/2013   Past Surgical History  Procedure Laterality Date  . Av fistula placement      Left x 2  . Cholecystectomy    . Total knee arthroplasty  09/20/2012    Procedure: TOTAL KNEE ARTHROPLASTY;  Surgeon: [name], MD;  Location: [facility] OR;  Service: Orthopedics;  Laterality: Right;  right total knee arthroplasty  . Joint replacement Right 09/20/12   Family History  Problem Relation Age of Onset  . Kidney failure Brother   . Hypertension Brother   . Kidney failure Brother   . Hypertension Mother   . Diabetes Mother   .  Hypertension Father   . Hypertension Sister    History  Substance Use Topics  . Smoking status: Current Every Day Smoker -- 0.30 packs/day for 31 years    Types: Cigarettes  . Smokeless tobacco: Never Used     Comment: 2-4 cigarettes a day  . Alcohol Use: No    Review of Systems  Constitutional: Negative for chills.  Respiratory: Positive for shortness of breath.   Cardiovascular: Negative for chest pain.  Gastrointestinal: Negative for abdominal pain.  Endocrine: Negative for polyuria.  Musculoskeletal: Positive for myalgias (Legs).  Neurological: Negative for headaches.  Psychiatric/Behavioral: Negative for agitation.  All other systems reviewed and are negative.    Allergies  Review of patient's allergies indicates no known allergies.  Home Medications   Current Outpatient Rx  Name  Route  Sig  Dispense  Refill  . amLODipine (NORVASC) 5 MG tablet   Oral   Take 5 mg by mouth daily.         . cinacalcet (SENSIPAR) 90 MG tablet   Oral   Take 90 mg by mouth daily.         . cloNIDine (CATAPRES) 0.1 MG tablet   Oral   Take 0.1 mg by mouth 2 (two) times daily.         Marland Kitchen gabapentin (NEURONTIN) 100 MG capsule   Oral   Take 100 mg by mouth at bedtime as  needed (nerve pain).          . Multiple Vitamins-Minerals (MULTIVITAMIN WITH MINERALS) tablet   Oral   Take 1 tablet by mouth daily.         . sevelamer (RENAGEL) 800 MG tablet   Oral   Take 800 mg by mouth 3 (three) times daily with meals.          BP 149/67  Pulse 83  Temp(Src) 98.2 F (36.8 C) (Oral)  Resp 16  Wt 320 lb (145.151 kg)  SpO2 95% Physical Exam  Nursing note and vitals reviewed. Constitutional: He is oriented to person, place, and time. He appears well-developed and well-nourished.  HENT:  Head: Normocephalic and atraumatic.  Eyes: EOM are normal. Pupils are equal, round, and reactive to light.  Neck: Normal range of motion.  Cardiovascular: Normal rate, regular rhythm and  intact distal pulses.   Hypertensive  Pulmonary/Chest: Effort normal. No respiratory distress. He has no wheezes. He exhibits no tenderness.  Crackles in bases  Abdominal: Soft. He exhibits no distension. There is no tenderness. There is no rebound and no guarding.  Musculoskeletal: Normal range of motion.  Neurological: He is alert and oriented to person, place, and time. No cranial nerve deficit. He exhibits normal muscle tone. Coordination normal.  Patient states he feels unstable. However neurological exam normal with no focal neuro deficits. Motor sensation and coordination are intact  Skin: Skin is warm and dry. No rash noted.  Psychiatric: He has a normal mood and affect.    ED Course  Procedures (including critical care time) Labs Review Labs Reviewed  CBC - Abnormal; Notable for the following:    RBC 3.75 (*)    Hemoglobin 11.1 (*)    HCT 32.6 (*)    All other components within normal limits  BASIC METABOLIC PANEL - Abnormal; Notable for the following:    Sodium 132 (*)    Potassium >7.5 (*)    Chloride 93 (*)    BUN 91 (*)    Creatinine, Ser 13.92 (*)    Calcium 8.1 (*)    GFR calc non Af Amer 3 (*)    GFR calc Af Amer 4 (*)    All other components within normal limits  PRO B NATRIURETIC PEPTIDE - Abnormal; Notable for the following:    Pro B Natriuretic peptide (BNP) 3892.0 (*)    All other components within normal limits  POCT I-STAT TROPONIN I   Imaging Review Dg Chest 2 View  12/02/2013   CLINICAL DATA:  Chest pain, shortness of Breath  EXAM: CHEST  2 VIEW  COMPARISON:  10/15/2013  FINDINGS: Cardiomegaly again noted. No segmental infiltrate or pulmonary edema. Lung bases are obscured by overlying abdominal wall tissue.  IMPRESSION: No segmental infiltrate. Suboptimal study as described above. No pulmonary edema.   Electronically Signed   By: Natasha Mead M.D.   On: 12/02/2013 18:45    EKG Interpretation   None       MDM   1. Hyperkalemia   2. SOB  (shortness of breath)   3. ESRD (end stage renal disease)     Renal patient who missed dialysis comes in with shortness of breath bilateral leg weakness. Likely secondary to fluid overload and metabolic abnormalities. Labs significant for hyperkalemia, elevated BNP. EKG changes consistent with hyperkalemia. Patient will require emergent dialysis. Patient received calcium gluconate was emergently transferred to dialysis unit. Ordered insulin D50 and Kayexalate however dialysis available emergently for definitive hyperkalemia treatment  and these were not given. Nephrology was consulted and has taken patient to dialysis. Hospitalist are aware of the patient and will admit afterward for observation. No further acute issues in emergency department  Bridgett Larsson, MD 12/02/13 2234

## 2013-12-02 NOTE — ED Notes (Signed)
Pt transferred to Hemodialysis by this RN. Report given to [facility].

## 2013-12-02 NOTE — H&P (Signed)
[facility] History and Physical  [name] [name] ZOX:[id number] DOB: 05-18-1961 DOA: 12/02/2013  Referring physician: ED PCP: Pcp Not In System  Chief Complaint: Generalized muscle weakness  HPI: [name] [name] is a 52 y.o. male ESRD with dialysis M/W/F who presents to the ED with c/o generalized muscle weakness.  Symptoms have been progressively worsening over the past few days, he missed his last dialysis session (last dialysis Friday).  He has been slightly short of breath due to fluid overload but not enough to bring him in to the ED.  In the ED his potassium was > 7.5.  I am seeing the patient in the HD unit at this time.  Review of Systems: 12 systems reviewed and otherwise negative.  Past Medical History  Diagnosis Date  . Hypertension   . End stage renal disease     a. MWF dialysis  . Arthritis   . Polysubstance abuse     a. tobacco and cocaine  . Chest pain     a. negative MV in 2004  . Abnormal ECG   . LVH (left ventricular hypertrophy)     a. 01/2013 Echo: EF 60-65%, sev concentric LVH, nl wall motion, sev dil LA.  Marland Kitchen Hyperkalemia 10/15/2013   Past Surgical History  Procedure Laterality Date  . Av fistula placement      Left x 2  . Cholecystectomy    . Total knee arthroplasty  09/20/2012    Procedure: TOTAL KNEE ARTHROPLASTY;  Surgeon: [name], MD;  Location: [facility] OR;  Service: Orthopedics;  Laterality: Right;  right total knee arthroplasty  . Joint replacement Right 09/20/12   Social History:  reports that he has been smoking Cigarettes.  He has a 9.3 pack-year smoking history. He has never used smokeless tobacco. He reports that he uses illicit drugs (Cocaine). He reports that he does not drink alcohol.   No Known Allergies  Family History  Problem Relation Age of Onset  . Kidney failure Brother   . Hypertension Brother   . Kidney failure Brother   . Hypertension Mother   . Diabetes Mother   . Hypertension Father   . Hypertension Sister      Prior to Admission medications   Medication Sig Start Date End Date Taking? Authorizing Provider  amLODipine (NORVASC) 5 MG tablet Take 5 mg by mouth daily.   Yes Historical Provider, MD  cinacalcet (SENSIPAR) 90 MG tablet Take 90 mg by mouth daily.   Yes Historical Provider, MD  cloNIDine (CATAPRES) 0.1 MG tablet Take 0.1 mg by mouth 2 (two) times daily.   Yes Historical Provider, MD  gabapentin (NEURONTIN) 100 MG capsule Take 100 mg by mouth at bedtime as needed (nerve pain).    Yes Historical Provider, MD  Multiple Vitamins-Minerals (MULTIVITAMIN WITH MINERALS) tablet Take 1 tablet by mouth daily.   Yes Historical Provider, MD  sevelamer (RENAGEL) 800 MG tablet Take 800 mg by mouth 3 (three) times daily with meals.   Yes Historical Provider, MD   Physical Exam: Filed Vitals:   12/02/13 2258  BP: 166/114  Pulse: 77  Temp:   Resp: 23    General:  NAD, resting comfortably in bed Eyes: PEERLA EOMI ENT: mucous membranes moist Neck: supple w/o JVD Cardiovascular: RRR w/o MRG Respiratory: CTA B Abdomen: soft, nt, nd, bs+ Skin: no rash nor lesion Musculoskeletal: MAE, full ROM all 4 extremities Psychiatric: normal tone and affect Neurologic: AAOx3, grossly non-focal  Labs on Admission:  Basic Metabolic Panel:  Recent Labs Lab 12/02/13 2011  NA 132*  K >7.5*  CL 93*  CO2 19  GLUCOSE 72  BUN 91*  CREATININE 13.92*  CALCIUM 8.1*   Liver Function Tests: No results found for this basename: AST, ALT, ALKPHOS, BILITOT, PROT, ALBUMIN,  in the last 168 hours No results found for this basename: LIPASE, AMYLASE,  in the last 168 hours No results found for this basename: AMMONIA,  in the last 168 hours CBC:  Recent Labs Lab 12/02/13 2011  WBC 8.9  HGB 11.1*  HCT 32.6*  MCV 86.9  PLT 204   Cardiac Enzymes: No results found for this basename: CKTOTAL, CKMB, CKMBINDEX, TROPONINI,  in the last 168 hours  BNP (last 3 results)  Recent Labs  05/27/13 2129  12/02/13 2011  PROBNP 4004.0* 3892.0*   CBG: No results found for this basename: GLUCAP,  in the last 168 hours  Radiological Exams on Admission: Dg Chest 2 View  12/02/2013   CLINICAL DATA:  Chest pain, shortness of Breath  EXAM: CHEST  2 VIEW  COMPARISON:  10/15/2013  FINDINGS: Cardiomegaly again noted. No segmental infiltrate or pulmonary edema. Lung bases are obscured by overlying abdominal wall tissue.  IMPRESSION: No segmental infiltrate. Suboptimal study as described above. No pulmonary edema.   Electronically Signed   By: Natasha Mead M.D.   On: 12/02/2013 18:45    EKG: Independently reviewed.  Assessment/Plan Principal Problem:   Hyperkalemia, diminished renal excretion Active Problems:   End stage renal disease   Noncompliance of patient with renal dialysis   Muscle weakness (generalized)   Hyperkalemia   1. Hyperkalemia - due to missed dialysis session with ESRD, plan: on dialysis right now, thankfully his EKG changes are already improving after just 30 mins on the monitor.  Repeat BMP in AM.  Had a long (50+ min) discussion with the patient about this, and about how his muscle weakness episodes were also likely due to hyperkalemia.  Specifically noted the high risk of sudden death without warning from hyperkalemia, and that he should not use the muscle weakness associated with this as his only indicator of hyperkalemia.  Again emphasized the critical and potentially fatal consequences of missing even a single dialysis session even if he feels fine!  Will have PT evaluate him in AM to see if muscle weakness still present, if so then needs work up for this, but if it is indeed secondary to hyperkalemia then it should correct within a couple of hours.  Dr. Lowell Guitar (nephrology) is at bedside here in the HD unit.  Code Status: Full (must indicate code status--if unknown or must be presumed, indicate so) Family Communication: No family present (indicate person spoken with, if  applicable, with phone number if by telephone) Disposition Plan: Admit to inpatient (indicate anticipated LOS)  Time spent: 70 min  Stan Cantave M. [facility] Pager [phone]  If 7PM-7AM, please contact night-coverage [email or web address] Password [email or web address] 12/02/2013, 11:28 PM

## 2013-12-02 NOTE — ED Notes (Signed)
Pt here c/o SOB and CP starting today with trouble walking; pt sts did not go to dialysis yesterday so last dialysis was Friday

## 2013-12-02 NOTE — Consult Note (Signed)
Patient is a 52 year old male with ESRD on dialysis Monday Wednesday Friday at the [facility] presents with the chief complaint of leg weakness and shortness of breath. Patient states his last treatment was Friday. Has missed yesterday.  Today is Tuesday.  For the past 24 hours patient has been feeling short of breath.  It is worse with exertion.  He denies chest pain. No other associated symptoms. He also complains of bilateral lower tremor he weakness and feels as though he may fall. He denies any falls. He had similar leg weakness when he was admitted with hyperkalemia in the past.  In the ED potassium was >7.5 meq/l.  On 10/22 K was 7.61meq/l and he had leg weakness at that time as well.  Past Medical History  Diagnosis Date  . Hypertension   . End stage renal disease     a. MWF dialysis  . Arthritis   . Polysubstance abuse     a. tobacco and cocaine  . Chest pain     a. negative MV in 2004  . Abnormal ECG   . LVH (left ventricular hypertrophy)     a. 01/2013 Echo: EF 60-65%, sev concentric LVH, nl wall motion, sev dil LA.  Marland Kitchen Hyperkalemia 10/15/2013   Past Surgical History  Procedure Laterality Date  . Av fistula placement      Left x 2  . Cholecystectomy    . Total knee arthroplasty  09/20/2012    Procedure: TOTAL KNEE ARTHROPLASTY;  Surgeon: [name], MD;  Location: [facility] OR;  Service: Orthopedics;  Laterality: Right;  right total knee arthroplasty  . Joint replacement Right 09/20/12   Social History:  reports that he has been smoking Cigarettes.  He has a 9.3 pack-year smoking history. He has never used smokeless tobacco. He reports that he uses illicit drugs (Cocaine). He reports that he does not drink alcohol. Allergies: No Known Allergies Family History  Problem Relation Age of Onset  . Kidney failure Brother   . Hypertension Brother   . Kidney failure Brother   . Hypertension Mother   . Diabetes Mother   . Hypertension Father   . Hypertension Sister      Medications:  Scheduled: . [START ON 12/03/2013] amLODipine  5 mg Oral Daily  . [START ON 12/03/2013] cinacalcet  90 mg Oral Daily  . cloNIDine  0.1 mg Oral BID  . heparin  5,000 Units Subcutaneous Q8H  . [START ON 12/03/2013] multivitamin with minerals  1 tablet Oral Daily  . [START ON 12/03/2013] sevelamer carbonate  800 mg Oral TID WC  . sodium chloride  3 mL Intravenous Q12H    ROS: as per HPI  Blood pressure 172/102, pulse 81, temperature 97.6 F (36.4 C), temperature source Oral, resp. rate 17, weight 145.151 kg (320 lb), SpO2 95.00%.  General appearance: alert and cooperative Head: Normocephalic, without obvious abnormality, atraumatic Eyes: negative Ears: normal TM's and external ear canals both ears Nose: Nares normal. Septum midline. Mucosa normal. No drainage or sinus tenderness. Throat: lips, mucosa, and tongue normal; teeth and gums normal Resp: clear to auscultation bilaterally Chest wall: no tenderness Cardio: regular rate and rhythm, S1, S2 normal, no murmur, click, rub or gallop GI: soft, non-tender; bowel sounds normal; no masses,  no organomegaly Extremities: edema 1+  Left arm with very large tortuous AVF Neurologic: nonfocal leg weakness not tested Results for orders placed during the hospital encounter of 12/02/13 (from the past 48 hour(s))  CBC  Status: Abnormal   Collection Time    12/02/13  8:11 PM      Result Value Range   WBC 8.9  4.0 - 10.5 K/uL   RBC 3.75 (*) 4.22 - 5.81 MIL/uL   Hemoglobin 11.1 (*) 13.0 - 17.0 g/dL   HCT 82.9 (*) 56.2 - 13.0 %   MCV 86.9  78.0 - 100.0 fL   MCH 29.6  26.0 - 34.0 pg   MCHC 34.0  30.0 - 36.0 g/dL   RDW 86.5  78.4 - 69.6 %   Platelets 204  150 - 400 K/uL  BASIC METABOLIC PANEL     Status: Abnormal   Collection Time    12/02/13  8:11 PM      Result Value Range   Sodium 132 (*) 135 - 145 mEq/L   Potassium >7.5 (*) 3.5 - 5.1 mEq/L   Comment: REPEATED TO VERIFY     NO VISIBLE HEMOLYSIS     E GENTILE,RN  2111 12/02/13 WBOND     CRITICAL RESULT CALLED TO, READ BACK BY AND VERIFIED WITH:   Chloride 93 (*) 96 - 112 mEq/L   CO2 19  19 - 32 mEq/L   Glucose, Bld 72  70 - 99 mg/dL   BUN 91 (*) 6 - 23 mg/dL   Creatinine, Ser 29.52 (*) 0.50 - 1.35 mg/dL   Calcium 8.1 (*) 8.4 - 10.5 mg/dL   GFR calc non Af Amer 3 (*) >90 mL/min   GFR calc Af Amer 4 (*) >90 mL/min   Comment: (NOTE)     The eGFR has been calculated using the CKD EPI equation.     This calculation has not been validated in all clinical situations.     eGFR's persistently <90 mL/min signify possible Chronic Kidney     Disease.  PRO B NATRIURETIC PEPTIDE     Status: Abnormal   Collection Time    12/02/13  8:11 PM      Result Value Range   Pro B Natriuretic peptide (BNP) 3892.0 (*) 0 - 125 pg/mL  POCT I-STAT TROPONIN I     Status: None   Collection Time    12/02/13  8:24 PM      Result Value Range   Troponin i, poc 0.06  0.00 - 0.08 ng/mL   Comment 3            Comment: Due to the release kinetics of cTnI,     a negative result within the first hours     of the onset of symptoms does not rule out     myocardial infarction with certainty.     If myocardial infarction is still suspected,     repeat the test at appropriate intervals.   Dg Chest 2 View  12/02/2013   CLINICAL DATA:  Chest pain, shortness of Breath  EXAM: CHEST  2 VIEW  COMPARISON:  10/15/2013  FINDINGS: Cardiomegaly again noted. No segmental infiltrate or pulmonary edema. Lung bases are obscured by overlying abdominal wall tissue.  IMPRESSION: No segmental infiltrate. Suboptimal study as described above. No pulmonary edema.   Electronically Signed   By: Natasha Mead M.D.   On: 12/02/2013 18:45    Assessment:  1 Severe Hyperkalemia, symptomatic 2  Missed hemodialysis/ ESRD 3  LUE AVF aneurysms Plan: 1 Emergent hemodialysis and patient education 2  Dialysis again in AM to get back on schedule MWF 3  Dietician to see Re K foods 4  Rotate needle sites; ask  VVS to  reevaluate(last seen for this on 12/04/12 by Dr. Arbie Cookey)  Casimiro Needle C 12/02/2013, 11:36 PM

## 2013-12-03 LAB — BASIC METABOLIC PANEL
BUN: 38 mg/dL — ABNORMAL HIGH (ref 6–23)
Calcium: 8.8 mg/dL (ref 8.4–10.5)
GFR calc Af Amer: 9 mL/min — ABNORMAL LOW (ref >90)
GFR calc non Af Amer: 8 mL/min — ABNORMAL LOW (ref >90)
Glucose, Bld: 98 mg/dL (ref 70–99)
Potassium: 3.5 mEq/L (ref 3.5–5.1)
Sodium: 134 mEq/L — ABNORMAL LOW (ref 135–145)

## 2013-12-03 MED ORDER — ALTEPLASE 2 MG IJ SOLR
2.0000 mg | Freq: Once | INTRAMUSCULAR | Status: DC | PRN
  Filled 2013-12-03: qty 2

## 2013-12-03 MED ORDER — SODIUM CHLORIDE 0.9 % IV SOLN: 100.0000 mL | INTRAVENOUS | Status: DC | PRN

## 2013-12-03 MED ORDER — LIDOCAINE-PRILOCAINE 2.5-2.5 % EX CREA
1.0000 "application " | TOPICAL_CREAM | CUTANEOUS | Status: DC | PRN
  Filled 2013-12-03: qty 5

## 2013-12-03 MED ORDER — HEPARIN SODIUM (PORCINE) 1000 UNIT/ML DIALYSIS: 1000.0000 [IU] | INTRAMUSCULAR | Status: DC | PRN

## 2013-12-03 MED ORDER — LIDOCAINE HCL (PF) 1 % IJ SOLN: 5.0000 mL | INTRAMUSCULAR | Status: DC | PRN

## 2013-12-03 MED ORDER — NEPRO/CARBSTEADY PO LIQD
237.0000 mL | ORAL | Status: DC | PRN
  Filled 2013-12-03: qty 237

## 2013-12-03 MED ORDER — HEPARIN SODIUM (PORCINE) 1000 UNIT/ML DIALYSIS: 20.0000 [IU]/kg | INTRAMUSCULAR | Status: DC | PRN

## 2013-12-03 MED ORDER — HEPARIN SODIUM (PORCINE) 1000 UNIT/ML DIALYSIS
1000.0000 [IU] | INTRAMUSCULAR | Status: DC | PRN
  Filled 2013-12-03: qty 1

## 2013-12-03 MED ORDER — LIDOCAINE-PRILOCAINE 2.5-2.5 % EX CREA: 1.0000 "application " | TOPICAL_CREAM | CUTANEOUS | Status: DC | PRN

## 2013-12-03 MED ORDER — PENTAFLUOROPROP-TETRAFLUOROETH EX AERO: 1.0000 "application " | INHALATION_SPRAY | CUTANEOUS | Status: DC | PRN

## 2013-12-03 MED ORDER — HEPARIN SODIUM (PORCINE) 1000 UNIT/ML DIALYSIS
20.0000 [IU]/kg | INTRAMUSCULAR | Status: DC | PRN
  Filled 2013-12-03: qty 3

## 2013-12-03 NOTE — Progress Notes (Signed)
Patient refused bed alarm. Educated on the reasons why it is necessary. Call light within reach, will continue to monitor.

## 2013-12-03 NOTE — Evaluation (Signed)
Occupational Therapy Evaluation Patient Details Name: [name] [name] MRN: [id number] DOB: 10-25-1961 Today's Date: 12/03/2013 Time: 2725-3664 OT Time Calculation (min): 7 min  OT Assessment / Plan / Recommendation History of present illness 52 y.o. male ESRD with dialysis M/W/F who presents to the ED with c/o generalized muscle weakness.  Symptoms have been progressively worsening over the past few days, he missed his last dialysis session (last dialysis Friday).  He has been slightly short of breath due to fluid overload but not enough to bring him in to the ED.  Potassium >7.5   Clinical Impression   Pt admitted with above.  Pt with very limited participation in OT eval, but appears able to perform BADLs at modified independent level.  Pt states "I'm fine".  He denies h/o falls, becomes irritable when asked about DME at home and why would he need it.  Pt is ambulating in room modified independently.   Pt is not receptive to further OT intervention.  He appears close to his baseline - unable to assess balance fully due to pt's refusal to fully participate. Will sign off.     OT Assessment  Patient does not need any further OT services    Follow Up Recommendations  No OT follow up    Barriers to Discharge      Equipment Recommendations  None recommended by OT    Recommendations for Other Services    Frequency       Precautions / Restrictions     Pertinent Vitals/Pain     ADL  Eating/Feeding: Independent Where Assessed - Eating/Feeding: Edge of bed Grooming: Wash/dry hands;Wash/dry face;Teeth care;Modified independent Where Assessed - Grooming: Unsupported standing Upper Body Bathing: Set up Where Assessed - Upper Body Bathing: Unsupported sitting Lower Body Bathing: Modified independent Where Assessed - Lower Body Bathing: Unsupported sit to stand Upper Body Dressing: Modified independent Where Assessed - Upper Body Dressing: Unsupported sitting Lower Body Dressing:  Modified independent Where Assessed - Lower Body Dressing: Unsupported sit to stand Toilet Transfer: Modified independent Toilet Transfer Method: Sit to stand;Stand pivot Acupuncturist: Comfort height toilet Toileting - Clothing Manipulation and Hygiene: Modified independent Where Assessed - Toileting Clothing Manipulation and Hygiene: Sit to stand from 3-in-1 or toilet Transfers/Ambulation Related to ADLs: Modified independent in room  ADL Comments: Pt is insistant that he is fine.  He was able to don/doff Lt sock, but refused further.  Pt ambulated past therapist to use BR.  No LOB balance noted and pt states "I keep telling you I'm fine, I don't know why you are here"    OT Diagnosis:    OT Problem List:   OT Treatment Interventions:     OT Goals(Current goals can be found in the care plan section)    Visit Information  Last OT Received On: 12/03/13 History of Present Illness: 52 y.o. male ESRD with dialysis M/W/F who presents to the ED with c/o generalized muscle weakness.  Symptoms have been progressively worsening over the past few days, he missed his last dialysis session (last dialysis Friday).  He has been slightly short of breath due to fluid overload but not enough to bring him in to the ED.  Potassium >7.5       Prior Functioning     Home Living Family/patient expects to be discharged to:: Private residence Living Arrangements: Alone Type of Home: Apartment Home Access: Level entry Home Layout: One level Home Equipment: Cane - single point Prior Function Level of Independence: Independent  Comments: Pt with very limited interation and only answers questions with prompting - is somewhat evasive and becomes irritable when clarification to answers needed Communication Communication: No difficulties         Vision/Perception     Cognition  Cognition Arousal/Alertness: Awake/alert Behavior During Therapy: Flat affect Overall Cognitive Status: No  family/caregiver present to determine baseline cognitive functioning (Pt with decreased judement at baseline per chart review)    Extremity/Trunk Assessment Upper Extremity Assessment Upper Extremity Assessment: Overall WFL for tasks assessed (gross assesment via observation ) Lower Extremity Assessment Lower Extremity Assessment: Defer to PT evaluation     Mobility Bed Mobility Bed Mobility: Not assessed Transfers Transfers: Sit to Stand;Stand to Sit Sit to Stand: 6: Modified independent (Device/Increase time) Stand to Sit: 6: Modified independent (Device/Increase time)     Exercise     Balance     End of Session OT - End of Session Activity Tolerance: Other (comment) (Pt refusal to participate in full eval ) Patient left: in bed;with call bell/phone within reach Nurse Communication: Mobility status  GO     [name] [name] M 12/03/2013, 10:51 AM

## 2013-12-03 NOTE — Discharge Summary (Signed)
[facility]                                                                                   [name] [name], is a 52 y.o. male  DOB May 01, 1961  MRN [id number].  Admission date:  12/02/2013  Admitting Physician  Hillary Bow, DO  Discharge Date:  12/03/2013   Primary MD  Pcp Not In System  Recommendations for primary care physician for things to follow:   Follow clinically, repeat BMP next visit in a week   Admission Diagnosis  End stage renal disease [585.6] Hyperkalemia [276.7] SOB (shortness of breath) [786.05] ESRD (end stage renal disease) [585.6] Noncompliance of patient with renal dialysis [V45.12]  Discharge Diagnosis   hyperkalemia, noncompliance with dialysis in the ESRD patient, continued smoking and cocaine abuse  Principal Problem:   Hyperkalemia, diminished renal excretion Active Problems:   End stage renal disease   Noncompliance of patient with renal dialysis   Muscle weakness (generalized)   Hyperkalemia      Past Medical History  Diagnosis Date  . Hypertension   . End stage renal disease     a. MWF dialysis  . Arthritis   . Polysubstance abuse     a. tobacco and cocaine  . Chest pain     a. negative MV in 2004  . Abnormal ECG   . LVH (left ventricular hypertrophy)     a. 01/2013 Echo: EF 60-65%, sev concentric LVH, nl wall motion, sev dil LA.  Marland Kitchen Hyperkalemia 10/15/2013    Past Surgical History  Procedure Laterality Date  . Av fistula placement      Left x 2  . Cholecystectomy    . Total knee arthroplasty  09/20/2012    Procedure: TOTAL KNEE ARTHROPLASTY;  Surgeon: [name], MD;  Location: [facility];  Service: Orthopedics;  Laterality: Right;  right total knee arthroplasty  . Joint replacement Right 09/20/12     Discharge Condition: Stable but poor long term prognosis due to continued noncompliance with dialysis schedule and continued abuse of cocaine along with smoking.   Follow-up Information   Follow up with PCP. Schedule an  appointment as soon as possible for a visit in 1 week.        Consults obtained - renal   Discharge Medications      Medication List         amLODipine 5 MG tablet  Commonly known as:  NORVASC  Take 5 mg by mouth daily.     cinacalcet 90 MG tablet  Commonly known as:  SENSIPAR  Take 90 mg by mouth daily.     cloNIDine 0.1 MG tablet  Commonly known as:  CATAPRES  Take 0.1 mg by mouth 2 (two) times daily.     gabapentin 100 MG capsule  Commonly known as:  NEURONTIN  Take 100 mg by mouth at bedtime as needed (nerve pain).     multivitamin with minerals tablet  Take 1 tablet by mouth daily.     sevelamer 800 MG tablet  Commonly known as:  RENAGEL  Take 800 mg by mouth 3 (three) times daily with meals.  Diet and Activity recommendation: See Discharge Instructions below   Discharge Instructions     Follow with Primary MD Pcp Not In System in 7 days   Get CBC, CMP, checked 7 days by Primary MD and again as instructed by your Primary MD.   Get Medicines reviewed and adjusted.  Please request your Prim.MD to go over all Hospital Tests and Procedure/Radiological results at the follow up, please get all Hospital records sent to your Prim MD by signing hospital release before you go home.  Activity: As tolerated with Full fall precautions use walker/cane & assistance as needed   Diet: Renal diet with 1.2 L fluid restriction per day  Check your Weight same time everyday, if you gain over 2 pounds, or you develop in leg swelling, experience more shortness of breath or chest pain, call your Primary MD immediately. Follow Cardiac Low Salt Diet and 1.2 lit/day fluid restriction.  Disposition Home    If you experience worsening of your admission symptoms, develop shortness of breath, life threatening emergency, suicidal or homicidal thoughts you must seek medical attention immediately by calling 911 or calling your MD immediately  if symptoms less severe.  You  Must read complete instructions/literature along with all the possible adverse reactions/side effects for all the Medicines you take and that have been prescribed to you. Take any new Medicines after you have completely understood and accpet all the possible adverse reactions/side effects.   Do not drive and provide baby sitting services if your were admitted for syncope or siezures until you have seen by Primary MD or a Neurologist and advised to do so again.  Do not drive when taking Pain medications.    Do not take more than prescribed Pain, Sleep and Anxiety Medications  Special Instructions: If you have smoked or chewed Tobacco  in the last 2 yrs please stop smoking, stop any regular Alcohol  and or any Recreational drug use.  Wear Seat belts while driving.   Please note  You were cared for by a hospitalist during your hospital stay. If you have any questions about your discharge medications or the care you received while you were in the hospital after you are discharged, you can call the unit and asked to speak with the hospitalist on call if the hospitalist that took care of you is not available. Once you are discharged, your primary care physician will handle any further medical issues. Please note that NO REFILLS for any discharge medications will be authorized once you are discharged, as it is imperative that you return to your primary care physician (or establish a relationship with a primary care physician if you do not have one) for your aftercare needs so that they can reassess your need for medications and monitor your lab values.  Major procedures and Radiology Reports - PLEASE review detailed and final reports for all details, in brief -       Dg Chest 2 View  12/02/2013   CLINICAL DATA:  Chest pain, shortness of Breath  EXAM: CHEST  2 VIEW  COMPARISON:  10/15/2013  FINDINGS: Cardiomegaly again noted. No segmental infiltrate or pulmonary edema. Lung bases are obscured by  overlying abdominal wall tissue.  IMPRESSION: No segmental infiltrate. Suboptimal study as described above. No pulmonary edema.   Electronically Signed   By: Natasha Mead M.D.   On: 12/02/2013 18:45    Micro Results      No results found for this or any previous visit (from the  past 240 hour(s)).   History of present illness and  Hospital Course:     Kindly see H&P for history of present illness and admission details, please review complete Labs, Consult reports and Test reports for all details in brief [name] [name], is a 52 y.o. male, patient with history of  ESRD on Monday, Wednesday, Friday dialysis schedule who has long-standing history of being noncompliant with his dialysis regimen, who continues unfortunately to abuse cocaine and cigarettes presented to the ER with lower extremity weakness after missing his dialysis treatment. He was found to be hyperkalemic and in mild pulmonary edema. He was seen by renal urgently at midnight and underwent urgent dialysis session. He's now symptom-free. Plan is to dialyze him again this morning thereafter he wants to be discharged home. He will continue his home medications unchanged. He has been extensively counseled and warned on not missing scheduled dialysis treatments and not to indulge anymore and cocaine or cigarette smoking. Voices understanding what appears to be extremely Demotivated. He has been warned of consequences which include disability and death. He will be seen by social work prior to discharge.   Post Discharge will resume his home medications unchanged as underlying problems which include ESRD, hypertension, chronic diastolic CHF.   Today   Subjective:   [name] [name] today has no headache,no chest abdominal pain,no new weakness tingling or numbness, feels much better wants to go home today.    Objective:   Blood pressure 157/89, pulse 82, temperature 98 F (36.7 C), temperature source Oral, resp. rate 18, height 5\' 11"  (1.803  m), weight 141.9 kg (312 lb 13.3 oz), SpO2 98.00%.   Intake/Output Summary (Last 24 hours) at 12/03/13 1041 Last data filed at 12/03/13 0840  Gross per 24 hour  Intake    400 ml  Output   5900 ml  Net  -5500 ml    Exam Awake Alert, Oriented *3, No new F.N deficits, Normal affect [place].AT,PERRAL Supple Neck,No JVD, No cervical lymphadenopathy appriciated.  Symmetrical Chest wall movement, Good air movement bilaterally, CTAB RRR,No Gallops,Rubs or new Murmurs, No Parasternal Heave +ve B.Sounds, Abd Soft, Non tender, No organomegaly appriciated, No rebound -guarding or rigidity. No Cyanosis, Clubbing or edema, No new Rash or bruise  Data Review   CBC w Diff: Lab Results  Component Value Date   WBC 8.9 12/02/2013   HGB 11.1* 12/02/2013   HCT 32.6* 12/02/2013   PLT 204 12/02/2013   LYMPHOPCT 18 10/15/2013   MONOPCT 6 10/15/2013   EOSPCT 2 10/15/2013   BASOPCT 0 10/15/2013    CMP: Lab Results  Component Value Date   NA 134* 12/03/2013   K 3.5 12/03/2013   CL 95* 12/03/2013   CO2 25 12/03/2013   BUN 38* 12/03/2013   CREATININE 6.89* 12/03/2013   PROT 7.7 10/15/2013   ALBUMIN 3.7 10/15/2013   BILITOT 0.3 10/15/2013   ALKPHOS 89 10/15/2013   AST 14 10/15/2013   ALT 11 10/15/2013  .   Total Time in preparing paper work, data evaluation and todays exam - 35 minutes  Leroy Sea M.D on 12/03/2013 at 10:41 AM  [facility] Office  [phone]

## 2013-12-03 NOTE — Progress Notes (Signed)
Patient has completed HD session. Patient is stable. Went over discharge instructions with the patient. Patient has no additional questions or concerns related to discharge. IV D/C'd and patient taken off the monitor. Patient ready for discharge. [name] [name]

## 2013-12-03 NOTE — ED Provider Notes (Signed)
This patient was seen in conjunction with the Resident Physician, Dr. Arlie Solomons. The documentation is accurate, and reflective of the encounter.  On my exam,  This patient appeared calm.  However, with his notable history of end-stage renal disease, polysubstance abuse, prior elevated troponins, and his endorsement of not having attended dialysis as scheduled yesterday, there was immediate suspicion for significant electrolyte abnormalities.  The patient's evaluation is most notable for significant hyperkalemia.  With this hyperkalemia, the patient's dyspnea, we arranged for emergent dialysis.  Patient's EKG is abnormal, but not otherwise changed from baseline.  Throughout the patient's care, Dr. Arlie Solomons and I discussed his care (his need for compliance with HD, and his condition on multiple occasions.) Please see my interpretation of the ecg in our electronic medical record.  CRITICAL CARE Performed by: Gerhard Munch Total critical care time: 35 Critical care time was exclusive of separately billable procedures and treating other patients. Critical care was necessary to treat or prevent imminent or life-threatening deterioration. Critical care was time spent personally by me on the following activities: development of treatment plan with patient and/or surrogate as well as nursing, discussions with consultants, evaluation of patient's response to treatment, examination of patient, obtaining history from patient or surrogate, ordering and performing treatments and interventions, ordering and review of laboratory studies, ordering and review of radiographic studies, pulse oximetry and re-evaluation of patient's condition.    Gerhard Munch, MD 12/03/13 [phone]

## 2013-12-03 NOTE — Progress Notes (Signed)
PT Cancellation Note  Patient Details Name: [name] [name] MRN: [id number] DOB: 06-27-1961   Cancelled Treatment:    Reason Eval/Treat Not Completed: PT screened, no needs identified, will sign off; noted pt with limited participation with OT.  Spoke with OT who reports pt without PT needs at this time and only minimally cooperative.  Will sign off.   Karlon Schlafer,CYNDI 12/03/2013, 11:41 AM

## 2013-12-03 NOTE — Progress Notes (Signed)
Nutrition Consult/Brief Note  RD consulted for renal diet education.    Patient briefly seen in HD session.  Stated "what is it?" upon RD arrival and thereafter showed no interest in education.  Left Food Pyramid for Healthy Eating with Kidney Disease handout on tray table.   Wt Readings from Last 15 Encounters:  12/03/13 318 lb 12.6 oz (144.6 kg)  10/15/13 324 lb 15.3 oz (147.4 kg)  09/12/13 309 lb 1.4 oz (140.2 kg)  05/30/13 325 lb 6.4 oz (147.6 kg)  05/30/13 325 lb 6.4 oz (147.6 kg)  03/04/13 333 lb (151.048 kg)  02/04/13 322 lb 12.8 oz (146.421 kg)  12/03/12 313 lb (141.976 kg)  09/23/12 293 lb 14 oz (133.3 kg)  09/20/12 305 lb 8.9 oz (138.6 kg)  09/23/12 293 lb 14 oz (133.3 kg)  09/12/12 138 lb 6.4 oz (62.778 kg)    Body mass index is 44.48 kg/(m^2). Patient meets criteria for Obesity Class III based on current BMI.   Current diet order is Renal 60/70-01-26-1199 ml , patient is consuming approximately 100% of meals at this time. Labs and medications reviewed.   No nutrition interventions warranted at this time. If nutrition issues arise, please consult RD.   Maureen Chatters, RD, LDN Pager #: [phone] After-Hours Pager #: [phone]

## 2014-02-11 ENCOUNTER — Other Ambulatory Visit: Payer: Self-pay | Admitting: *Deleted

## 2014-02-11 DIAGNOSIS — T82598A Other mechanical complication of other cardiac and vascular devices and implants, initial encounter: Secondary | ICD-10-CM

## 2014-02-23 ENCOUNTER — Encounter: Payer: Self-pay | Admitting: Vascular Surgery

## 2014-02-24 ENCOUNTER — Inpatient Hospital Stay (HOSPITAL_COMMUNITY): Admission: RE | Admit: 2014-02-24 | Payer: Medicare Other | Source: Ambulatory Visit

## 2014-02-24 ENCOUNTER — Ambulatory Visit: Payer: Medicare Other | Admitting: Vascular Surgery

## 2014-02-26 ENCOUNTER — Ambulatory Visit: Payer: Medicare Other | Admitting: Vascular Surgery

## 2014-02-26 ENCOUNTER — Other Ambulatory Visit (HOSPITAL_COMMUNITY): Payer: Medicare Other

## 2014-03-12 ENCOUNTER — Ambulatory Visit (HOSPITAL_COMMUNITY)
Admission: RE | Admit: 2014-03-12 | Discharge: 2014-03-12 | Disposition: A | Discharge status: Home / Self Care | Payer: Medicare Other | Source: Ambulatory Visit | Attending: Vascular Surgery | Admitting: Vascular Surgery

## 2014-03-12 DIAGNOSIS — Y841 Kidney dialysis as the cause of abnormal reaction of the patient, or of later complication, without mention of misadventure at the time of the procedure: Secondary | ICD-10-CM | POA: Insufficient documentation

## 2014-03-12 DIAGNOSIS — T82598A Other mechanical complication of other cardiac and vascular devices and implants, initial encounter: Secondary | ICD-10-CM | POA: Insufficient documentation

## 2014-03-16 ENCOUNTER — Encounter: Payer: Self-pay | Admitting: Vascular Surgery

## 2014-03-17 ENCOUNTER — Ambulatory Visit (INDEPENDENT_AMBULATORY_CARE_PROVIDER_SITE_OTHER): Payer: Medicare Other | Admitting: Vascular Surgery

## 2014-03-17 ENCOUNTER — Encounter: Payer: Self-pay | Admitting: Vascular Surgery

## 2014-03-17 VITALS — BP 160/92 | HR 74 | Resp 18 | Ht 71.5 in | Wt 312.5 lb

## 2014-03-17 DIAGNOSIS — N186 End stage renal disease: Secondary | ICD-10-CM

## 2014-03-17 NOTE — Progress Notes (Signed)
Here today for evaluation of his left upper arm AV fistula. As was placed in 2006 and these had very good use of her since that time. He did have an area of erosion to the skin with impending rupture in 2012 and underwent resection of this portion with continued use since that time. He reports that he is having extremely good flow in the dialysis unit with no difficulty in accessing and no difficulty with flow on hemodialysis. He has no history of bleeding issues  Past Medical History  Diagnosis Date  . Hypertension   . End stage renal disease     a. MWF dialysis  . Arthritis   . Polysubstance abuse     a. tobacco and cocaine  . Chest pain     a. negative MV in 2004  . Abnormal ECG   . LVH (left ventricular hypertrophy)     a. 01/2013 Echo: EF 60-65%, sev concentric LVH, nl wall motion, sev dil LA.  Marland Kitchen. Hyperkalemia 10/15/2013    History  Substance Use Topics  . Smoking status: Current Every Day Smoker -- 0.30 packs/day for 31 years    Types: Cigarettes  . Smokeless tobacco: Never Used     Comment: 2-4 cigarettes a day  . Alcohol Use: No    Family History  Problem Relation Age of Onset  . Kidney failure Brother   . Hypertension Brother   . Kidney failure Brother   . Hypertension Mother   . Diabetes Mother   . Hypertension Father   . Hypertension Sister     No Known Allergies  Current outpatient prescriptions:amLODipine (NORVASC) 5 MG tablet, Take 5 mg by mouth daily., Disp: , Rfl: ;  cinacalcet (SENSIPAR) 90 MG tablet, Take 90 mg by mouth daily., Disp: , Rfl: ;  Multiple Vitamins-Minerals (MULTIVITAMIN WITH MINERALS) tablet, Take 1 tablet by mouth daily., Disp: , Rfl: ;  sevelamer (RENAGEL) 800 MG tablet, Take 800 mg by mouth 3 (three) times daily with meals., Disp: , Rfl:  cloNIDine (CATAPRES) 0.1 MG tablet, Take 0.1 mg by mouth 2 (two) times daily., Disp: , Rfl: ;  gabapentin (NEURONTIN) 100 MG capsule, Take 100 mg by mouth at bedtime as needed (nerve pain). , Disp: , Rfl:    BP 160/92  Pulse 74  Resp 18  Ht 5' 11.5" (1.816 m)  Wt 312 lb 8 oz (141.749 kg)  BMI 42.98 kg/m2  Body mass index is 42.98 kg/(m^2).       On physical exam he does have a very large dilatation of the entire upper arm cephalic vein fistula on the left. This is from the antecubital space all the way up to as near shoulder. There is some tortuosity. He does not have any evidence of skin breakdown or ulceration.  He underwent a venous shunt duplex evaluation on 03/12/2014. I discussed this with him. This does show a dilatation but no evidence of stenotic flow or other evidence of narrowing in the graft.  Impression and plan: Large dilatation of cephalic vein fistula with no evidence of impending rupture or skin erosion. I have discussed this at length the patient. His lungs he is having his skin intact would recommend continued use of this as he. He was reassured this discussion will see us again on an as-needed basis

## 2014-03-19 ENCOUNTER — Ambulatory Visit: Payer: Medicare Other | Admitting: Vascular Surgery

## 2014-03-19 ENCOUNTER — Other Ambulatory Visit (HOSPITAL_COMMUNITY): Payer: Medicare Other

## 2014-08-14 ENCOUNTER — Other Ambulatory Visit: Payer: Self-pay | Admitting: *Deleted

## 2014-08-14 DIAGNOSIS — T82598A Other mechanical complication of other cardiac and vascular devices and implants, initial encounter: Secondary | ICD-10-CM

## 2014-09-07 ENCOUNTER — Encounter: Payer: Self-pay | Admitting: Vascular Surgery

## 2014-09-08 ENCOUNTER — Ambulatory Visit (INDEPENDENT_AMBULATORY_CARE_PROVIDER_SITE_OTHER): Payer: Medicare Other | Admitting: Vascular Surgery

## 2014-09-08 ENCOUNTER — Encounter: Payer: Self-pay | Admitting: Vascular Surgery

## 2014-09-08 ENCOUNTER — Ambulatory Visit (HOSPITAL_COMMUNITY)
Admission: RE | Admit: 2014-09-08 | Discharge: 2014-09-08 | Disposition: A | Discharge status: Home / Self Care | Payer: Medicare Other | Source: Ambulatory Visit | Attending: Vascular Surgery | Admitting: Vascular Surgery

## 2014-09-08 VITALS — BP 192/123 | HR 69 | Resp 14 | Ht 71.0 in | Wt 292.0 lb

## 2014-09-08 DIAGNOSIS — T82598A Other mechanical complication of other cardiac and vascular devices and implants, initial encounter: Secondary | ICD-10-CM | POA: Diagnosis not present

## 2014-09-08 DIAGNOSIS — N186 End stage renal disease: Secondary | ICD-10-CM

## 2014-09-08 NOTE — Progress Notes (Signed)
Here today for evaluation of left arm AV fistula. This was initially placed by myself and 2008 with upper arm AV fistula. Is extremely good views of the. Did have one episode 12. Erosion of an area over the fistula and had surgical repair of this. Hasn't no other episodes of clotting or bleeding or problems with his fistula. He recently was seen by nephrology and had further evaluation recommended. She will discuss this with me before proceeding he denies any pain associated with the fistula. He denies prolonged bleeding. He denies any difficulty with access of the fistula and reports no difficulty as far as his dialysis run.  Past Medical History  Diagnosis Date  . Hypertension   . End stage renal disease     a. MWF dialysis  . Arthritis   . Polysubstance abuse     a. tobacco and cocaine  . Chest pain     a. negative MV in 2004  . Abnormal ECG   . LVH (left ventricular hypertrophy)     a. 01/2013 Echo: EF 60-65%, sev concentric LVH, nl wall motion, sev dil LA.  Marland Kitchen Hyperkalemia 10/15/2013    History  Substance Use Topics  . Smoking status: Current Every Day Smoker -- 0.30 packs/day for 31 years    Types: Cigarettes  . Smokeless tobacco: Never Used     Comment: 2-4 cigarettes a day  . Alcohol Use: No    Family History  Problem Relation Age of Onset  . Kidney failure Brother   . Hypertension Brother   . Kidney failure Brother   . Hypertension Mother   . Diabetes Mother   . Hypertension Father   . Hypertension Sister     No Known Allergies  Current outpatient prescriptions:amLODipine (NORVASC) 5 MG tablet, Take 5 mg by mouth daily., Disp: , Rfl: ;  cinacalcet (SENSIPAR) 90 MG tablet, Take 90 mg by mouth daily., Disp: , Rfl: ;  cloNIDine (CATAPRES) 0.1 MG tablet, Take 0.1 mg by mouth 2 (two) times daily., Disp: , Rfl: ;  gabapentin (NEURONTIN) 100 MG capsule, Take 100 mg by mouth at bedtime as needed (nerve pain). , Disp: , Rfl:  Multiple Vitamins-Minerals (MULTIVITAMIN WITH  MINERALS) tablet, Take 1 tablet by mouth daily., Disp: , Rfl: ;  sevelamer (RENAGEL) 800 MG tablet, Take 800 mg by mouth 3 (three) times daily with meals., Disp: , Rfl:   BP 192/123  Pulse 69  Resp 14  Ht  (1.803 m)  Wt 292 lb (132.45 kg)  BMI 40.74 kg/m2  Body mass index is 40.74 kg/(m^2).       Physical exam a well-developed gentleman acute distress Grossly intact neurologically His left arm is noted for scorer for a radiocephalic fistula attempt. He has a very large with aneurysmal dilatation left upper arm AV fistula. There are several areas of hypopigmentation where he has been accessed multiple times. There is no skin breakdown. There is no thinning of the skin. He has an excellent thrill in this.   Impression and plan: Long-standing fistula for approximately 7 years with excellent long-term access. Discussed concerns regarding skin breakdown which is not present. He understands to keep an eye on this. I explained that he might potentially have central venous stenosis as the cause of this of more firm and enlarged there's been present for many years with excellent function. I feel completely comfortable with continued use of the fistula and evaluation only should he have either pain or skin loss or inadequate dialysis access.  He is comfortable with this decision will see [place] in as-needed basis '

## 2014-12-03 ENCOUNTER — Encounter (HOSPITAL_COMMUNITY): Payer: Self-pay | Admitting: Cardiovascular Disease

## 2015-01-09 ENCOUNTER — Inpatient Hospital Stay (HOSPITAL_COMMUNITY): Payer: Medicare Other

## 2015-01-09 ENCOUNTER — Encounter (HOSPITAL_COMMUNITY)
Admission: EM | Disposition: A | Discharge status: Nursing Home (Includes ICF) | Payer: Self-pay | Source: Home / Self Care | Attending: Pulmonary Disease

## 2015-01-09 ENCOUNTER — Encounter (HOSPITAL_COMMUNITY): Payer: Self-pay | Admitting: Physical Medicine and Rehabilitation

## 2015-01-09 ENCOUNTER — Emergency Department (HOSPITAL_COMMUNITY): Payer: Medicare Other

## 2015-01-09 ENCOUNTER — Inpatient Hospital Stay (HOSPITAL_COMMUNITY)
Admission: EM | Admit: 2015-01-09 | Discharge: 2015-01-21 | DRG: 917 | Disposition: A | Discharge status: Other Acute Inpatient Hospital | Payer: Medicare Other | Attending: Pulmonary Disease | Admitting: Pulmonary Disease

## 2015-01-09 DIAGNOSIS — Z590 Homelessness: Secondary | ICD-10-CM

## 2015-01-09 DIAGNOSIS — Z9119 Patient's noncompliance with other medical treatment and regimen: Secondary | ICD-10-CM | POA: Diagnosis present

## 2015-01-09 DIAGNOSIS — N186 End stage renal disease: Secondary | ICD-10-CM | POA: Diagnosis present

## 2015-01-09 DIAGNOSIS — E162 Hypoglycemia, unspecified: Secondary | ICD-10-CM | POA: Diagnosis not present

## 2015-01-09 DIAGNOSIS — E875 Hyperkalemia: Secondary | ICD-10-CM | POA: Diagnosis not present

## 2015-01-09 DIAGNOSIS — D631 Anemia in chronic kidney disease: Secondary | ICD-10-CM | POA: Diagnosis present

## 2015-01-09 DIAGNOSIS — I440 Atrioventricular block, first degree: Secondary | ICD-10-CM | POA: Diagnosis not present

## 2015-01-09 DIAGNOSIS — I2510 Atherosclerotic heart disease of native coronary artery without angina pectoris: Secondary | ICD-10-CM | POA: Diagnosis present

## 2015-01-09 DIAGNOSIS — K567 Ileus, unspecified: Secondary | ICD-10-CM | POA: Diagnosis not present

## 2015-01-09 DIAGNOSIS — R4020 Unspecified coma: Secondary | ICD-10-CM

## 2015-01-09 DIAGNOSIS — I468 Cardiac arrest due to other underlying condition: Secondary | ICD-10-CM | POA: Diagnosis present

## 2015-01-09 DIAGNOSIS — Z841 Family history of disorders of kidney and ureter: Secondary | ICD-10-CM

## 2015-01-09 DIAGNOSIS — I953 Hypotension of hemodialysis: Secondary | ICD-10-CM | POA: Diagnosis present

## 2015-01-09 DIAGNOSIS — Z6838 Body mass index (BMI) 38.0-38.9, adult: Secondary | ICD-10-CM

## 2015-01-09 DIAGNOSIS — Z833 Family history of diabetes mellitus: Secondary | ICD-10-CM

## 2015-01-09 DIAGNOSIS — I469 Cardiac arrest, cause unspecified: Secondary | ICD-10-CM | POA: Diagnosis present

## 2015-01-09 DIAGNOSIS — I472 Ventricular tachycardia: Secondary | ICD-10-CM | POA: Diagnosis present

## 2015-01-09 DIAGNOSIS — R402112 Coma scale, eyes open, never, at arrival to emergency department: Secondary | ICD-10-CM | POA: Diagnosis present

## 2015-01-09 DIAGNOSIS — E2740 Unspecified adrenocortical insufficiency: Secondary | ICD-10-CM | POA: Diagnosis present

## 2015-01-09 DIAGNOSIS — I5032 Chronic diastolic (congestive) heart failure: Secondary | ICD-10-CM | POA: Diagnosis present

## 2015-01-09 DIAGNOSIS — I252 Old myocardial infarction: Secondary | ICD-10-CM | POA: Diagnosis not present

## 2015-01-09 DIAGNOSIS — Y929 Unspecified place or not applicable: Secondary | ICD-10-CM | POA: Diagnosis not present

## 2015-01-09 DIAGNOSIS — T405X4A Poisoning by cocaine, undetermined, initial encounter: Principal | ICD-10-CM | POA: Diagnosis present

## 2015-01-09 DIAGNOSIS — I517 Cardiomegaly: Secondary | ICD-10-CM | POA: Diagnosis present

## 2015-01-09 DIAGNOSIS — G931 Anoxic brain damage, not elsewhere classified: Secondary | ICD-10-CM | POA: Diagnosis present

## 2015-01-09 DIAGNOSIS — F1410 Cocaine abuse, uncomplicated: Secondary | ICD-10-CM | POA: Diagnosis present

## 2015-01-09 DIAGNOSIS — R570 Cardiogenic shock: Secondary | ICD-10-CM | POA: Diagnosis present

## 2015-01-09 DIAGNOSIS — R739 Hyperglycemia, unspecified: Secondary | ICD-10-CM | POA: Diagnosis present

## 2015-01-09 DIAGNOSIS — Z9289 Personal history of other medical treatment: Secondary | ICD-10-CM

## 2015-01-09 DIAGNOSIS — I4901 Ventricular fibrillation: Secondary | ICD-10-CM | POA: Diagnosis present

## 2015-01-09 DIAGNOSIS — R1310 Dysphagia, unspecified: Secondary | ICD-10-CM | POA: Diagnosis present

## 2015-01-09 DIAGNOSIS — I10 Essential (primary) hypertension: Secondary | ICD-10-CM | POA: Diagnosis present

## 2015-01-09 DIAGNOSIS — F1910 Other psychoactive substance abuse, uncomplicated: Secondary | ICD-10-CM | POA: Diagnosis present

## 2015-01-09 DIAGNOSIS — Z992 Dependence on renal dialysis: Secondary | ICD-10-CM

## 2015-01-09 DIAGNOSIS — R402312 Coma scale, best motor response, none, at arrival to emergency department: Secondary | ICD-10-CM | POA: Diagnosis present

## 2015-01-09 DIAGNOSIS — Z66 Do not resuscitate: Secondary | ICD-10-CM | POA: Diagnosis not present

## 2015-01-09 DIAGNOSIS — R402212 Coma scale, best verbal response, none, at arrival to emergency department: Secondary | ICD-10-CM | POA: Diagnosis present

## 2015-01-09 DIAGNOSIS — R451 Restlessness and agitation: Secondary | ICD-10-CM | POA: Diagnosis not present

## 2015-01-09 DIAGNOSIS — Z8249 Family history of ischemic heart disease and other diseases of the circulatory system: Secondary | ICD-10-CM | POA: Diagnosis not present

## 2015-01-09 DIAGNOSIS — Z4659 Encounter for fitting and adjustment of other gastrointestinal appliance and device: Secondary | ICD-10-CM

## 2015-01-09 DIAGNOSIS — J9811 Atelectasis: Secondary | ICD-10-CM | POA: Diagnosis present

## 2015-01-09 DIAGNOSIS — I4891 Unspecified atrial fibrillation: Secondary | ICD-10-CM | POA: Diagnosis not present

## 2015-01-09 DIAGNOSIS — J9690 Respiratory failure, unspecified, unspecified whether with hypoxia or hypercapnia: Secondary | ICD-10-CM

## 2015-01-09 DIAGNOSIS — F17210 Nicotine dependence, cigarettes, uncomplicated: Secondary | ICD-10-CM | POA: Diagnosis present

## 2015-01-09 DIAGNOSIS — I120 Hypertensive chronic kidney disease with stage 5 chronic kidney disease or end stage renal disease: Secondary | ICD-10-CM | POA: Diagnosis present

## 2015-01-09 DIAGNOSIS — E669 Obesity, unspecified: Secondary | ICD-10-CM | POA: Diagnosis present

## 2015-01-09 DIAGNOSIS — J9600 Acute respiratory failure, unspecified whether with hypoxia or hypercapnia: Secondary | ICD-10-CM

## 2015-01-09 DIAGNOSIS — J9601 Acute respiratory failure with hypoxia: Secondary | ICD-10-CM | POA: Diagnosis present

## 2015-01-09 DIAGNOSIS — E876 Hypokalemia: Secondary | ICD-10-CM | POA: Diagnosis present

## 2015-01-09 DIAGNOSIS — T80219A Unspecified infection due to central venous catheter, initial encounter: Secondary | ICD-10-CM

## 2015-01-09 DIAGNOSIS — D649 Anemia, unspecified: Secondary | ICD-10-CM

## 2015-01-09 DIAGNOSIS — R4182 Altered mental status, unspecified: Secondary | ICD-10-CM

## 2015-01-09 DIAGNOSIS — Z96651 Presence of right artificial knee joint: Secondary | ICD-10-CM | POA: Diagnosis present

## 2015-01-09 DIAGNOSIS — R140 Abdominal distension (gaseous): Secondary | ICD-10-CM

## 2015-01-09 DIAGNOSIS — N2581 Secondary hyperparathyroidism of renal origin: Secondary | ICD-10-CM | POA: Diagnosis present

## 2015-01-09 DIAGNOSIS — Z9115 Patient's noncompliance with renal dialysis: Secondary | ICD-10-CM | POA: Diagnosis present

## 2015-01-09 DIAGNOSIS — I509 Heart failure, unspecified: Secondary | ICD-10-CM

## 2015-01-09 HISTORY — PX: LEFT HEART CATH: SHX5478

## 2015-01-09 LAB — POCT I-STAT, CHEM 8
BUN: 41 mg/dL — ABNORMAL HIGH (ref 6–23)
BUN: 40 mg/dL — ABNORMAL HIGH (ref 6–23)
BUN: 41 mg/dL — ABNORMAL HIGH (ref 6–23)
BUN: 38 mg/dL — ABNORMAL HIGH (ref 6–23)
CALCIUM ION: 1.22 mmol/L (ref 1.12–1.23)
CHLORIDE: 104 meq/L (ref 96–112)
CREATININE: 7.6 mg/dL — AB (ref 0.50–1.35)
Calcium, Ion: 1.18 mmol/L (ref 1.12–1.23)
Calcium, Ion: 1.18 mmol/L (ref 1.12–1.23)
Calcium, Ion: 1.07 mmol/L — ABNORMAL LOW (ref 1.12–1.23)
Chloride: 101 mEq/L (ref 96–112)
Chloride: 102 mEq/L (ref 96–112)
Chloride: 110 mEq/L (ref 96–112)
Creatinine, Ser: 8.2 mg/dL — ABNORMAL HIGH (ref 0.50–1.35)
Creatinine, Ser: 8.3 mg/dL — ABNORMAL HIGH (ref 0.50–1.35)
Creatinine, Ser: 8.1 mg/dL — ABNORMAL HIGH (ref 0.50–1.35)
GLUCOSE: 228 mg/dL — AB (ref 70–99)
GLUCOSE: 241 mg/dL — AB (ref 70–99)
Glucose, Bld: 229 mg/dL — ABNORMAL HIGH (ref 70–99)
Glucose, Bld: 238 mg/dL — ABNORMAL HIGH (ref 70–99)
HCT: 41 % (ref 39.0–52.0)
HCT: 43 % (ref 39.0–52.0)
HCT: 44 % (ref 39.0–52.0)
HCT: 43 % (ref 39.0–52.0)
HEMOGLOBIN: 13.9 g/dL (ref 13.0–17.0)
HEMOGLOBIN: 14.6 g/dL (ref 13.0–17.0)
Hemoglobin: 15 g/dL (ref 13.0–17.0)
Hemoglobin: 14.6 g/dL (ref 13.0–17.0)
POTASSIUM: 4.5 mmol/L (ref 3.5–5.1)
POTASSIUM: 3.7 mmol/L (ref 3.5–5.1)
POTASSIUM: 3.3 mmol/L — AB (ref 3.5–5.1)
Potassium: 3.5 mmol/L (ref 3.5–5.1)
SODIUM: 136 mmol/L (ref 135–145)
SODIUM: 140 mmol/L (ref 135–145)
SODIUM: 140 mmol/L (ref 135–145)
Sodium: 137 mmol/L (ref 135–145)
TCO2: 22 mmol/L (ref 0–100)
TCO2: 20 mmol/L (ref 0–100)
TCO2: 21 mmol/L (ref 0–100)
TCO2: 19 mmol/L (ref 0–100)

## 2015-01-09 LAB — I-STAT ARTERIAL BLOOD GAS, ED
Acid-base deficit: 3 mmol/L — ABNORMAL HIGH (ref 0.0–2.0)
Bicarbonate: 23.1 mEq/L (ref 20.0–24.0)
O2 Saturation: 100 %
PH ART: 7.327 — AB (ref 7.350–7.450)
Patient temperature: 98.6
TCO2: 24 mmol/L (ref 0–100)
pCO2 arterial: 44 mmHg (ref 35.0–45.0)
pO2, Arterial: 273 mmHg — ABNORMAL HIGH (ref 80.0–100.0)

## 2015-01-09 LAB — BASIC METABOLIC PANEL
ANION GAP: 10 (ref 5–15)
ANION GAP: 16 — AB (ref 5–15)
BUN: 38 mg/dL — ABNORMAL HIGH (ref 6–23)
BUN: 45 mg/dL — ABNORMAL HIGH (ref 6–23)
CALCIUM: 8.8 mg/dL (ref 8.4–10.5)
CHLORIDE: 99 meq/L (ref 96–112)
CHLORIDE: 99 meq/L (ref 96–112)
CO2: 29 mmol/L (ref 19–32)
CO2: 21 mmol/L (ref 19–32)
CREATININE: 8.87 mg/dL — AB (ref 0.50–1.35)
CREATININE: 8.51 mg/dL — AB (ref 0.50–1.35)
Calcium: 9.2 mg/dL (ref 8.4–10.5)
GFR calc Af Amer: 7 mL/min — ABNORMAL LOW (ref >90)
GFR, EST AFRICAN AMERICAN: 7 mL/min — AB (ref >90)
GFR, EST NON AFRICAN AMERICAN: 6 mL/min — AB (ref >90)
GFR, EST NON AFRICAN AMERICAN: 6 mL/min — AB (ref >90)
Glucose, Bld: 225 mg/dL — ABNORMAL HIGH (ref 70–99)
Glucose, Bld: 245 mg/dL — ABNORMAL HIGH (ref 70–99)
Potassium: 4.6 mmol/L (ref 3.5–5.1)
Potassium: 3.4 mmol/L — ABNORMAL LOW (ref 3.5–5.1)
SODIUM: 136 mmol/L (ref 135–145)
Sodium: 138 mmol/L (ref 135–145)

## 2015-01-09 LAB — GLUCOSE, CAPILLARY
GLUCOSE-CAPILLARY: 209 mg/dL — AB (ref 70–99)
GLUCOSE-CAPILLARY: 234 mg/dL — AB (ref 70–99)
GLUCOSE-CAPILLARY: 217 mg/dL — AB (ref 70–99)
Glucose-Capillary: 207 mg/dL — ABNORMAL HIGH (ref 70–99)

## 2015-01-09 LAB — TROPONIN I: Troponin I: 0.8 ng/mL (ref <0.031)

## 2015-01-09 LAB — DIFFERENTIAL
BASOS ABS: 0 10*3/uL (ref 0.0–0.1)
Basophils Relative: 0 % (ref 0–1)
EOS ABS: 0.2 10*3/uL (ref 0.0–0.7)
EOS PCT: 2 % (ref 0–5)
Lymphocytes Relative: 33 % (ref 12–46)
Lymphs Abs: 3 10*3/uL (ref 0.7–4.0)
MONO ABS: 0.5 10*3/uL (ref 0.1–1.0)
MONOS PCT: 6 % (ref 3–12)
NEUTROS ABS: 5.4 10*3/uL (ref 1.7–7.7)
NEUTROS PCT: 59 % (ref 43–77)

## 2015-01-09 LAB — PROTIME-INR
INR: 1.19 (ref 0.00–1.49)
INR: 1.24 (ref 0.00–1.49)
INR: 1.23 (ref 0.00–1.49)
PROTHROMBIN TIME: 15.7 s — AB (ref 11.6–15.2)
PROTHROMBIN TIME: 15.6 s — AB (ref 11.6–15.2)
Prothrombin Time: 15.2 seconds (ref 11.6–15.2)

## 2015-01-09 LAB — CBC
HCT: 36.9 % — ABNORMAL LOW (ref 39.0–52.0)
Hemoglobin: 11.9 g/dL — ABNORMAL LOW (ref 13.0–17.0)
MCH: 29.1 pg (ref 26.0–34.0)
MCHC: 32.2 g/dL (ref 30.0–36.0)
MCV: 90.2 fL (ref 78.0–100.0)
PLATELETS: 175 10*3/uL (ref 150–400)
RBC: 4.09 MIL/uL — ABNORMAL LOW (ref 4.22–5.81)
RDW: 14.3 % (ref 11.5–15.5)
WBC: 9.1 10*3/uL (ref 4.0–10.5)

## 2015-01-09 LAB — MAGNESIUM: MAGNESIUM: 2.3 mg/dL (ref 1.5–2.5)

## 2015-01-09 LAB — CBG MONITORING, ED: Glucose-Capillary: 215 mg/dL — ABNORMAL HIGH (ref 70–99)

## 2015-01-09 LAB — POCT I-STAT TROPONIN I: Troponin i, poc: 0.07 ng/mL (ref 0.00–0.08)

## 2015-01-09 LAB — APTT
APTT: 34 s (ref 24–37)
aPTT: 32 seconds (ref 24–37)
aPTT: 33 seconds (ref 24–37)

## 2015-01-09 LAB — PHOSPHORUS: Phosphorus: 6.4 mg/dL — ABNORMAL HIGH (ref 2.3–4.6)

## 2015-01-09 LAB — MRSA PCR SCREENING: MRSA by PCR: NEGATIVE

## 2015-01-09 SURGERY — LEFT HEART CATH
Anesthesia: Moderate Sedation | Laterality: Bilateral

## 2015-01-09 MED ORDER — SODIUM CHLORIDE 0.9 % IV SOLN
1.0000 mg/h | INTRAVENOUS | Status: DC
  Administered 2015-01-09: 1 mg/h via INTRAVENOUS
  Administered 2015-01-09: 3 mg/h via INTRAVENOUS
  Administered 2015-01-10 – 2015-01-11 (×2): 4 mg/h via INTRAVENOUS
  Administered 2015-01-11 – 2015-01-13 (×2): 2 mg/h via INTRAVENOUS
  Administered 2015-01-14: 1 mg/h via INTRAVENOUS
  Filled 2015-01-09 (×7): qty 10

## 2015-01-09 MED ORDER — DEXTROSE 5 % IV SOLN
0.0000 ug/min | INTRAVENOUS | Status: DC
  Administered 2015-01-09: 5 ug/min via INTRAVENOUS
  Administered 2015-01-14: 10 ug/min via INTRAVENOUS
  Filled 2015-01-09 (×3): qty 16

## 2015-01-09 MED ORDER — FENTANYL CITRATE 0.05 MG/ML IJ SOLN: 100.0000 ug | Freq: Once | INTRAMUSCULAR | Status: DC

## 2015-01-09 MED ORDER — SODIUM CHLORIDE 0.9 % IV SOLN
INTRAVENOUS | Status: DC
  Administered 2015-01-09 – 2015-01-21 (×5): via INTRAVENOUS

## 2015-01-09 MED ORDER — CHLORHEXIDINE GLUCONATE 0.12 % MT SOLN
15.0000 mL | Freq: Once | OROMUCOSAL | Status: DC
  Filled 2015-01-09: qty 15

## 2015-01-09 MED ORDER — AMIODARONE IV BOLUS ONLY 150 MG/100ML
150.0000 mg | Freq: Once | INTRAVENOUS | Status: AC
  Administered 2015-01-09: 150 mg via INTRAVENOUS
  Filled 2015-01-09: qty 100

## 2015-01-09 MED ORDER — NICARDIPINE HCL IN NACL 20-0.86 MG/200ML-% IV SOLN
3.0000 mg/h | INTRAVENOUS | Status: DC
  Administered 2015-01-09 (×2): 15 mg/h via INTRAVENOUS
  Administered 2015-01-09: 5 mg/h via INTRAVENOUS
  Filled 2015-01-09 (×4): qty 200

## 2015-01-09 MED ORDER — SODIUM CHLORIDE 0.9 % IV SOLN
INTRAVENOUS | Status: DC
  Administered 2015-01-09: 1.6 [IU]/h via INTRAVENOUS
  Filled 2015-01-09: qty 2.5

## 2015-01-09 MED ORDER — CISATRACURIUM BOLUS VIA INFUSION
0.0500 mg/kg | INTRAVENOUS | Status: DC | PRN
  Filled 2015-01-09: qty 7

## 2015-01-09 MED ORDER — MIDAZOLAM BOLUS VIA INFUSION
2.0000 mg | INTRAVENOUS | Status: DC | PRN
  Administered 2015-01-09: 2 mg via INTRAVENOUS
  Filled 2015-01-09 (×2): qty 2

## 2015-01-09 MED ORDER — PANTOPRAZOLE SODIUM 40 MG IV SOLR
40.0000 mg | INTRAVENOUS | Status: DC
  Administered 2015-01-09 – 2015-01-11 (×3): 40 mg via INTRAVENOUS
  Filled 2015-01-09 (×3): qty 40

## 2015-01-09 MED ORDER — POTASSIUM CHLORIDE 10 MEQ/50ML IV SOLN
10.0000 meq | INTRAVENOUS | Status: AC
  Administered 2015-01-09 (×2): 10 meq via INTRAVENOUS
  Filled 2015-01-09 (×2): qty 50

## 2015-01-09 MED ORDER — ASPIRIN 300 MG RE SUPP
300.0000 mg | RECTAL | Status: AC
  Filled 2015-01-09: qty 1

## 2015-01-09 MED ORDER — CETYLPYRIDINIUM CHLORIDE 0.05 % MT LIQD
7.0000 mL | Freq: Four times a day (QID) | OROMUCOSAL | Status: DC
  Administered 2015-01-10 – 2015-01-20 (×44): 7 mL via OROMUCOSAL

## 2015-01-09 MED ORDER — ARTIFICIAL TEARS OP OINT
1.0000 "application " | TOPICAL_OINTMENT | Freq: Three times a day (TID) | OPHTHALMIC | Status: DC
  Administered 2015-01-09 – 2015-01-11 (×5): 1 via OPHTHALMIC
  Filled 2015-01-09: qty 3.5

## 2015-01-09 MED ORDER — FENTANYL BOLUS VIA INFUSION
50.0000 ug | INTRAVENOUS | Status: DC | PRN
  Administered 2015-01-09: 50 ug via INTRAVENOUS
  Filled 2015-01-09: qty 50

## 2015-01-09 MED ORDER — CHLORHEXIDINE GLUCONATE 0.12 % MT SOLN
15.0000 mL | Freq: Two times a day (BID) | OROMUCOSAL | Status: DC
  Administered 2015-01-09 – 2015-01-21 (×24): 15 mL via OROMUCOSAL
  Filled 2015-01-09 (×22): qty 15

## 2015-01-09 MED ORDER — SODIUM CHLORIDE 0.9 % IV SOLN: 2000.0000 mL | Freq: Once | INTRAVENOUS | Status: DC

## 2015-01-09 MED ORDER — NOREPINEPHRINE BITARTRATE 1 MG/ML IV SOLN
0.0000 ug/min | INTRAVENOUS | Status: DC
  Filled 2015-01-09: qty 4

## 2015-01-09 MED ORDER — INSULIN ASPART 100 UNIT/ML ~~LOC~~ SOLN: 2.0000 [IU] | SUBCUTANEOUS | Status: DC

## 2015-01-09 MED ORDER — CISATRACURIUM BOLUS VIA INFUSION
0.1000 mg/kg | Freq: Once | INTRAVENOUS | Status: AC
  Administered 2015-01-09: 13.3 mg via INTRAVENOUS
  Filled 2015-01-09: qty 14

## 2015-01-09 MED ORDER — NICARDIPINE HCL IN NACL 40-0.83 MG/200ML-% IV SOLN
3.0000 mg/h | INTRAVENOUS | Status: DC
  Administered 2015-01-09: 15 mg/h via INTRAVENOUS
  Administered 2015-01-09: 5 mg/h via INTRAVENOUS
  Administered 2015-01-10: 3 mg/h via INTRAVENOUS
  Filled 2015-01-09 (×4): qty 200

## 2015-01-09 MED ORDER — ROCURONIUM BROMIDE 50 MG/5ML IV SOLN
INTRAVENOUS | Status: AC
  Filled 2015-01-09: qty 2

## 2015-01-09 MED ORDER — ROCURONIUM BROMIDE 50 MG/5ML IV SOLN
50.0000 mg | Freq: Once | INTRAVENOUS | Status: DC
  Filled 2015-01-09: qty 5

## 2015-01-09 MED ORDER — SODIUM CHLORIDE 0.9 % IV SOLN
25.0000 ug/h | INTRAVENOUS | Status: DC
  Administered 2015-01-09: 25 ug/h via INTRAVENOUS
  Administered 2015-01-09: 75 ug/h via INTRAVENOUS
  Administered 2015-01-10: 300 ug/h via INTRAVENOUS
  Administered 2015-01-10: 250 ug/h via INTRAVENOUS
  Administered 2015-01-10: 350 ug/h via INTRAVENOUS
  Administered 2015-01-11: 225 ug/h via INTRAVENOUS
  Administered 2015-01-11: 200 ug/h via INTRAVENOUS
  Administered 2015-01-12 – 2015-01-13 (×2): 250 ug/h via INTRAVENOUS
  Administered 2015-01-13: 150 ug/h via INTRAVENOUS
  Administered 2015-01-14: 25 ug/h via INTRAVENOUS
  Filled 2015-01-09 (×10): qty 50

## 2015-01-09 MED ORDER — ETOMIDATE 2 MG/ML IV SOLN
INTRAVENOUS | Status: AC
Start: 2015-01-09 — End: 2015-01-10
  Filled 2015-01-09: qty 20

## 2015-01-09 MED ORDER — CALCIUM CHLORIDE 10 % IV SOLN
INTRAVENOUS | Status: DC | PRN
  Administered 2015-01-09: 1 g via INTRAVENOUS

## 2015-01-09 MED ORDER — SODIUM CHLORIDE 0.9 % IV BOLUS (SEPSIS)
2000.0000 mL | Freq: Once | INTRAVENOUS | Status: AC
  Administered 2015-01-09: 2000 mL via INTRAVENOUS

## 2015-01-09 MED ORDER — ASPIRIN 300 MG RE SUPP
300.0000 mg | Freq: Once | RECTAL | Status: AC
  Administered 2015-01-09: 300 mg via RECTAL
  Filled 2015-01-09: qty 1

## 2015-01-09 MED ORDER — SODIUM CHLORIDE 0.9 % IV SOLN
1.0000 ug/kg/min | INTRAVENOUS | Status: DC
  Administered 2015-01-09: 1 ug/kg/min via INTRAVENOUS
  Administered 2015-01-10 (×2): 1.5 ug/kg/min via INTRAVENOUS
  Filled 2015-01-09 (×3): qty 20

## 2015-01-09 MED ORDER — MIDAZOLAM HCL 2 MG/2ML IJ SOLN
2.0000 mg | Freq: Once | INTRAMUSCULAR | Status: AC
  Administered 2015-01-09: 2 mg via INTRAVENOUS

## 2015-01-09 MED ORDER — LIDOCAINE HCL (CARDIAC) 20 MG/ML IV SOLN
INTRAVENOUS | Status: AC
  Filled 2015-01-09: qty 5

## 2015-01-09 MED ORDER — SUCCINYLCHOLINE CHLORIDE 20 MG/ML IJ SOLN
INTRAMUSCULAR | Status: AC
  Filled 2015-01-09: qty 1

## 2015-01-09 MED ORDER — CHLORHEXIDINE GLUCONATE 0.12 % MT SOLN
OROMUCOSAL | Status: AC
  Filled 2015-01-09: qty 15

## 2015-01-09 NOTE — ED Notes (Signed)
First contact with patient. Artic sun initiated with goal of 33. Close monitoring continued.

## 2015-01-09 NOTE — Consult Note (Signed)
CARDIOLOGY CONSULT NOTE      Patient ID: [name] [name] MRN: [id number] DOB/AGE: [birth year] 54 y.o.  Admit date: 01/09/2015 Referring PhysicianWesam Santa Genera, MD; [facility] Primary PhysicianPcp Not In System Primary Cardiologist Dr. Antoine Poche Reason for Consultation  Cardiac arrest; possible STEMI  HPI: 54 year old man with end-stage renal disease and a history of polysubstance abuse. In 2014, handwritten cardiac catheterization after a non-STEMI in the setting of cocaine abuse. He underwent diagnostic catheterization showing a stenosis in a diagonal branch. This was not intervened upon due to patient noncompliance with dialysis and drug abuse. He was medically managed. He had normal LV function at that time.  Earlier today, he was sitting in a chair and slumped over. He was unresponsive. EMS was called. It is estimated that he went 10 minutes before receiving any CPR. He eventually was resuscitated with defibrillation. He then arrested again and required epinephrine. In the emergency room, he had ventricular tachycardia and was defibrillated again. He was intubated. ECG from EMS revealed inferior ST elevations and some lateral ST segment depressions. A code STEMI was called.  The patient was intubated in the ER. At that time, he required no sedation. He has not received any sedation at this point and has not tried to bite on the tube or made any purposeful movements. We have been unable to obtain any meaningful history.  Review of systems complete and found to be negative unless listed above   Past Medical History  Diagnosis Date  . Hypertension   . End stage renal disease     a. MWF dialysis  . Arthritis   . Polysubstance abuse     a. tobacco and cocaine  . Chest pain     a. negative MV in 2004  . Abnormal ECG   . LVH (left ventricular hypertrophy)     a. 01/2013 Echo: EF 60-65%, sev concentric LVH, nl wall motion, sev dil LA.  Marland Kitchen Hyperkalemia 10/15/2013    Family History  Problem  Relation Age of Onset  . Kidney failure Brother   . Hypertension Brother   . Kidney failure Brother   . Hypertension Mother   . Diabetes Mother   . Hypertension Father   . Hypertension Sister     History   Social History  . Marital Status: Single    Spouse Name: N/A    Number of Children: N/A  . Years of Education: N/A   Occupational History  . Not on file.   Social History Main Topics  . Smoking status: Current Every Day Smoker -- 0.30 packs/day for 31 years    Types: Cigarettes  . Smokeless tobacco: Never Used     Comment: 2-4 cigarettes a day  . Alcohol Use: No  . Drug Use: Yes    Special: Cocaine     Comment: h/o cocain abuse.  quit late 2013 but resumed 05/2013.  Marland Kitchen Sexual Activity: Not on file   Other Topics Concern  . Not on file   Social History Narrative   Was living with a friend in [place] but was kicked out on Friday and has been living "on the street" since.  He plans to go to a homeless shelter in [facility] when he leaves here.    Past Surgical History  Procedure Laterality Date  . Av fistula placement      Left x 2  . Cholecystectomy    . Total knee arthroplasty  09/20/2012    Procedure: TOTAL KNEE ARTHROPLASTY;  Surgeon: Molli Hazard  Rosalia Hammers Olin, MD;  Location: [facility] OR;  Service: Orthopedics;  Laterality: Right;  right total knee arthroplasty  . Joint replacement Right 09/20/12  . Left heart catheterization with coronary angiogram N/A 05/29/2013    Procedure: LEFT HEART CATHETERIZATION WITH CORONARY ANGIOGRAM;  Surgeon: Tonny BollmanMichael Cooper, MD;  [facility];  Service: Cardiovascular;  Laterality: N/A;     Prescriptions prior to admission  Medication Sig Dispense Refill Last Dose  . amLODipine (NORVASC) 5 MG tablet Take 5 mg by mouth daily.   Taking  . cinacalcet (SENSIPAR) 90 MG tablet Take 90 mg by mouth daily.   Taking  . cloNIDine (CATAPRES) 0.1 MG tablet Take 0.1 mg by mouth 2 (two) times daily.   Taking  . gabapentin (NEURONTIN) 100 MG capsule Take 100 mg by  mouth at bedtime as needed (nerve pain).    Taking  . Multiple Vitamins-Minerals (MULTIVITAMIN WITH MINERALS) tablet Take 1 tablet by mouth daily.   Taking  . sevelamer (RENAGEL) 800 MG tablet Take 800 mg by mouth 3 (three) times daily with meals.   Taking    Physical Exam: Vitals:   Filed Vitals:   01/09/15 1312 01/09/15 1328 01/09/15 1340  BP: 112/42 149/85 122/57  Pulse: 86 80 77  Temp: 98.1 F (36.7 C)    TempSrc: Rectal    Resp: 14 14 14   SpO2: 99% 100% 99%   I&O's:  No intake or output data in the 24 hours ending 01/09/15 1414 Physical exam: Intubated [place]/AT Eyes closed No JVD,  RRR   No wheezing Obese No spontaneous movement Unable to assess affect  Labs:   Lab Results  Component Value Date   WBC 8.9 12/02/2013   HGB 11.1* 12/02/2013   HCT 32.6* 12/02/2013   MCV 86.9 12/02/2013   PLT 204 12/02/2013   No results for input(s): NA, K, CL, CO2, BUN, CREATININE, CALCIUM, PROT, BILITOT, ALKPHOS, ALT, AST, GLUCOSE in the last 168 hours.  Invalid input(s): LABALBU Lab Results  Component Value Date   CKTOTAL 97 03/06/2008   CKMB 3.1 03/06/2008   TROPONINI <0.30 09/10/2013    Lab Results  Component Value Date   CHOL 159 05/29/2013   Lab Results  Component Value Date   HDL 32* 05/29/2013   Lab Results  Component Value Date   LDLCALC 107* 05/29/2013   Lab Results  Component Value Date   TRIG 101 05/29/2013   Lab Results  Component Value Date   CHOLHDL 5.0 05/29/2013   No results found for: LDLDIRECT     EKG: Normal sinus rhythm, inferior Q waves with ST elevation. ST depression in the high lateral leads.  ASSESSMENT AND PLAN:  Active Problems:   Cardiac arrest   Acute respiratory failure with hypoxemia   Cardiogenic shock   Anoxic encephalopathy   Altered mental status  I discussed the case at length with the ER physician and with critical care medicine. I reviewed his old ECGs. He has a similar pattern of inferior Q waves and ST elevation  inferiorly on his old ECGs. There is also some high lateral ST depression as well. We discussed cardiac catheterization at length. Initially we thought about performing cath, but after comparing his old ECGs and realizing that there is some doubt about whether he will make a neurologic recovery, it seemed that head CT was more important test. We'll also need to see electrolytes. Hyperkalemia could be a possible cause of this. His RCA does not show any significant disease on the  old cath from 1994. I personally reviewed the films. He had some diagonal disease which would've been unlikely to cause a cardiac arrest.  Rather than emergent cath, would favor assessment of his neurologic status. If he does show signs of recovery and there is objective evidence of ischemia, would then reconsider cardiac cath. I discussed the plan with critical care. They are agreeable.  Will follow.  Critical care time 55 minutes Signed:   Fredric Mare, MD, [facility] 01/09/2015, 2:14 PM

## 2015-01-09 NOTE — ED Notes (Signed)
nike tennis shoes, black socks, underwear and wallet with ID at bedside.

## 2015-01-09 NOTE — Procedures (Signed)
Central Venous Catheter Insertion Procedure Note Elfredia Nevinsugene N Tutterow [id number] 1961/05/18  Procedure: Insertion of Central Venous Catheter Indications: Assessment of intravascular volume, Drug and/or fluid administration and Frequent blood sampling  Procedure Details Consent: Unable to obtain consent because of emergent medical necessity. Time Out: Verified patient identification, verified procedure, site/side was marked, verified correct patient position, special equipment/implants available, medications/allergies/relevent history reviewed, required imaging and test results available.  Performed  Maximum sterile technique was used including antiseptics, cap, gloves, gown, hand hygiene, mask and sheet. Skin prep: Chlorhexidine; local anesthetic administered A antimicrobial bonded/coated triple lumen catheter was placed in the right internal jugular vein using the Seldinger technique.  Evaluation Blood flow good Complications: No apparent complications Patient did tolerate procedure well. Chest X-ray ordered to verify placement.  CXR: pending.  U/S used in placement.  Kadien Lineman 01/09/2015, 2:44 PM

## 2015-01-09 NOTE — ED Notes (Addendum)
Ventricular Tachycardia noted on monitor, EDP at bedside. Pt defibrillated 120J x1, NSR noted on cardiac monitor.

## 2015-01-09 NOTE — ED Notes (Signed)
Pt placed on zoll pads, monitor, continuous pulse oximetry and blood pressure cuff; upon arrival pt was already intubated by [facility]; RT maintaining airway at this time

## 2015-01-09 NOTE — Progress Notes (Signed)
Receiving several phone calls wanting information on [name]. No password established, no family present at bedside. This nurse contacted Mrs. Aurther Lofterry Speed at [phone] who was listed as emergency contact. She was updated on patient's condition and discussed the volume of phone call that this unit was receiving. We created a password of "[phone]". I encouraged her to try to reach patient's family. She said that she would be working on getting his family notified.

## 2015-01-09 NOTE — ED Notes (Addendum)
Pt presents to department via [facility] from home for evaluation of cardiac arrest. Pt was slumped over in chair upon fire arrival, CPR started, defibrillated x1, received x1 epinephrine, x1 D50 per EMS.  8.0 ET tube, 18g RAC. ST elevation noted on EKG, BP 250/130. Received x1 NS Bolus per EMS.

## 2015-01-09 NOTE — Progress Notes (Addendum)
CRITICAL VALUE ALERT  Critical value received:  Troponin 0.8  Date of notification: 01/09/15  Time of notification: 0939  Critical value read back: yes  Nurse who received alert:  MN  MD notified (1st page):  Deterding  Time of first page:  2315  Responding MD: Deterding  Time MD responded:  2315   *Troponin drawn at 2000, resulted at 2139, documented in results review by lab for 1359

## 2015-01-09 NOTE — Progress Notes (Signed)
eLink Physician-Brief Progress Note Patient Name: [name] [name] Persons DOB: 07-18-1961 MRN: [id number]   Date of Service  01/09/2015  HPI/Events of Note  Pt with persistent hyperglycemia  eICU Interventions  Insulin drip      Intervention Category Major Interventions: Hyperglycemia - active titration of insulin therapy  [name] 01/09/2015, 8:17 PM

## 2015-01-09 NOTE — Progress Notes (Signed)
   01/09/15 1300  Clinical Encounter Type  Visited With Health care provider  Visit Type Initial;Code;ED   Chaplain was paged to the ED for a Code Stemi at 1:04 PM. Chaplain was further notified that patient came into the ED post-CPR. EMS indicated that their were witnesses present when the patient became unresponsive but none of them identified themselves as family. Chaplain contacted the patient's emergency contact who was listed as a friend. Another friend, not identified as the contact answered the phone. This person indicated that they knew the patient and that the emergency contact the chaplain was trying to reach was her mother. Person on phone indicated that her family has been friends with the patient for a long time and are the closest to family that the patient has. Another person looking for the patient called the hospital and chaplain spoke with them. This person identified as the patient's fiancee. Patient's fiancee demanded more information regarding the patient but patient was in Cath Lab receiving care. Chaplain encouraged patient's fiancee to come to the hospital if she wished to find out more information regarding the patient's condition. Page Merrilyn Puman-Call chaplain if patient or patient's friends/family need more support. [place] NeighborStrother, Tal Kempker R, Chaplain  2:06 PM

## 2015-01-09 NOTE — ED Notes (Signed)
Critical care paged in relation to bp

## 2015-01-09 NOTE — Progress Notes (Signed)
Called ELink and had MD Shan LevansPatrick Wright camera in to observe bounding and bulging fistulas. 2 most prominent fistulas (with gauze and tape) located on left AC, 1 smaller fistula on left wrist, and 1 smaller fistula on left clavicle area of patient. MD made aware and will continue to monitor.

## 2015-01-09 NOTE — ED Provider Notes (Signed)
CSN: [id number]     Arrival date & time 01/09/15  1304 History   First MD Initiated Contact with Patient 01/09/15 1324     Chief Complaint  Patient presents with  . Cardiac Arrest     (Consider location/radiation/quality/duration/timing/severity/associated sxs/prior Treatment) HPI Comments: 54 yo male with history of CAD and ESRD who presents after a cardiac arrest. Per EMS report, patient was seen to slump over in his chair and become unresponsive. Fire department showed up approximately 10 minutes later, and found him to be pulseless. They found a shockable rhythm and defibrillated. He regained pulses briefly. On EMS arrival, he lost pulses again, and required 2 rounds of CPR and 1 epinephrine. Subsequently, he regained pulses again. During this second code, he was intubated by EMS.  No other history is available due to patient's unresponsiveness. Level V caveat applies.   Past Medical History  Diagnosis Date  . Hypertension   . End stage renal disease     a. MWF dialysis  . Arthritis   . Polysubstance abuse     a. tobacco and cocaine  . Chest pain     a. negative MV in 2004  . Abnormal ECG   . LVH (left ventricular hypertrophy)     a. 01/2013 Echo: EF 60-65%, sev concentric LVH, nl wall motion, sev dil LA.  Marland Kitchen. Hyperkalemia 10/15/2013   Past Surgical History  Procedure Laterality Date  . Av fistula placement      Left x 2  . Cholecystectomy    . Total knee arthroplasty  09/20/2012    Procedure: TOTAL KNEE ARTHROPLASTY;  Surgeon: Shelda PalMatthew D Olin, MD;  [facility];  Service: Orthopedics;  Laterality: Right;  right total knee arthroplasty  . Joint replacement Right 09/20/12  . Left heart catheterization with coronary angiogram N/A 05/29/2013    Procedure: LEFT HEART CATHETERIZATION WITH CORONARY ANGIOGRAM;  Surgeon: Tonny BollmanMichael Cooper, MD;  [facility];  Service: Cardiovascular;  Laterality: N/A;   Family History  Problem Relation Age of Onset  . Kidney failure Brother    . Hypertension Brother   . Kidney failure Brother   . Hypertension Mother   . Diabetes Mother   . Hypertension Father   . Hypertension Sister    History  Substance Use Topics  . Smoking status: Current Every Day Smoker -- 0.30 packs/day for 31 years    Types: Cigarettes  . Smokeless tobacco: Never Used     Comment: 2-4 cigarettes a day  . Alcohol Use: No    Review of Systems  Unable to perform ROS: Patient unresponsive      Allergies  Review of patient's allergies indicates no known allergies.  Home Medications   Prior to Admission medications   Medication Sig Start Date End Date Taking? Authorizing Provider  amLODipine (NORVASC) 5 MG tablet Take 5 mg by mouth daily.    Historical Provider, MD  cinacalcet (SENSIPAR) 90 MG tablet Take 90 mg by mouth daily.    Historical Provider, MD  cloNIDine (CATAPRES) 0.1 MG tablet Take 0.1 mg by mouth 2 (two) times daily.    Historical Provider, MD  gabapentin (NEURONTIN) 100 MG capsule Take 100 mg by mouth at bedtime as needed (nerve pain).     Historical Provider, MD  Multiple Vitamins-Minerals (MULTIVITAMIN WITH MINERALS) tablet Take 1 tablet by mouth daily.    Historical Provider, MD  sevelamer (RENAGEL) 800 MG tablet Take 800 mg by mouth 3 (three) times daily with meals.  Historical Provider, MD   BP 122/57 mmHg  Pulse 77  Temp(Src) 98.1 F (36.7 C) (Rectal)  Resp 14  SpO2 99% Physical Exam  Constitutional: He appears well-developed and well-nourished.  HENT:  Head: Normocephalic and atraumatic.  Eyes: Conjunctivae are normal. Pupils are equal, round, and reactive to light. No scleral icterus.  Neck: Neck supple.  Dialysis fistula extends into left neck with palpable thrill  Cardiovascular: Normal rate, regular rhythm, normal heart sounds and intact distal pulses.   No murmur heard. Pulmonary/Chest: No stridor.  Mechanical breath sounds  Abdominal: Soft. He exhibits no distension. There is no rebound and no guarding.   Musculoskeletal: Normal range of motion. He exhibits no edema.  Neurological: He is unresponsive. GCS eye subscore is 1. GCS verbal subscore is 1. GCS motor subscore is 1.  Skin: Skin is warm and dry. No rash noted.  Psychiatric:  Unable to test  Nursing note and vitals reviewed.   ED Course  CARDIOVERSION Date/Time: 01/09/2015 2:34 PM Performed by: Orson Gear DAVID Authorized by: Orson Gear DAVID Consent: The procedure was performed in an emergent situation. Patient sedated: No., patient unresponsive. Cardioversion basis: emergent Pre-procedure rhythm: ventricular tachycardia Chest area: chest area exposed Electrodes: pads Electrodes placed: anterior-posterior Number of attempts: 1 Attempt 1 mode: synchronous Attempt 1 waveform: biphasic Attempt 1 shock (in Joules): 120 Attempt 1 outcome: conversion to normal sinus rhythm Post-procedure rhythm: normal sinus rhythm Complications: no complications Patient tolerance: Patient tolerated the procedure well with no immediate complications  CRITICAL CARE Performed by: Orson Gear DAVID Authorized by: Orson Gear DAVID Total critical care time: 40 minutes Critical care time was exclusive of separately billable procedures and treating other patients. Critical care was necessary to treat or prevent imminent or life-threatening deterioration of the following conditions: cardiac failure and CNS failure or compromise. Critical care was time spent personally by me on the following activities: development of treatment plan with patient or surrogate, discussions with consultants, evaluation of patient's response to treatment, examination of patient, obtaining history from patient or surrogate, ordering and performing treatments and interventions, ordering and review of laboratory studies, ordering and review of radiographic studies, pulse oximetry, re-evaluation of patient's condition and review of old charts.    (including critical care time) Labs Review Labs Reviewed  CBC - Abnormal; Notable for the following:    RBC 4.09 (*)    Hemoglobin 11.9 (*)    HCT 36.9 (*)    All other components within normal limits  CBG MONITORING, ED - Abnormal; Notable for the following:    Glucose-Capillary 215 (*)    All other components within normal limits  I-STAT ARTERIAL BLOOD GAS, ED - Abnormal; Notable for the following:    pH, Arterial 7.327 (*)    pO2, Arterial 273.0 (*)    Acid-base deficit 3.0 (*)    All other components within normal limits  POCT I-STAT, CHEM 8 - Abnormal; Notable for the following:    BUN 41 (*)    Creatinine, Ser 8.20 (*)    Glucose, Bld 229 (*)    All other components within normal limits  DIFFERENTIAL  PROTIME-INR  APTT  BASIC METABOLIC PANEL  DRUGS OF ABUSE SCREEN W/O ALC, ROUTINE URINE  MAGNESIUM  PHOSPHORUS  TROPONIN I  TROPONIN I  TROPONIN I  BASIC METABOLIC PANEL  BASIC METABOLIC PANEL  BASIC METABOLIC PANEL  BASIC METABOLIC PANEL  BASIC METABOLIC PANEL  BASIC METABOLIC PANEL  BASIC METABOLIC PANEL  PROTIME-INR  PROTIME-INR  APTT  APTT  BLOOD GAS, ARTERIAL  I-STAT TROPOININ, ED  I-STAT CHEM 8, ED    Imaging Review Ct Head Wo Contrast  01/09/2015   CLINICAL DATA:  Post code.  Question anoxic brain injury.  EXAM: CT HEAD WITHOUT CONTRAST  TECHNIQUE: Contiguous axial images were obtained from the base of the skull through the vertex without intravenous contrast.  COMPARISON:  None.  FINDINGS: No acute intracranial abnormality. Specifically, no hemorrhage, hydrocephalus, mass lesion, acute infarction, or significant intracranial injury. No acute calvarial abnormality. No current CT evidence or not brain injury.  Extensive soft tissue thickening within the nasal airway and ethmoid air cells as well as maxillary sinuses and sphenoid sinus. Air-fluid levels in the sphenoid sinuses. Mastoid air cells are clear.  IMPRESSION: No acute intracranial abnormality.    Electronically Signed   By: Charlett Nose M.D.   On: 01/09/2015 14:40   Dg Chest Port 1 View  01/09/2015   CLINICAL DATA:  Central line placement  EXAM: PORTABLE CHEST - 1 VIEW  COMPARISON:  01/09/2015  FINDINGS: Right internal jugular central line is in place with the tip in the SVC. No pneumothorax. Endotracheal tube and NG tube remain in place, unchanged. Cardiomegaly. Vascular congestion. No real change since prior study.  IMPRESSION: Right central line tip in the SVC.  No pneumothorax.   Electronically Signed   By: Charlett Nose M.D.   On: 01/09/2015 15:20   Dg Chest Portable 1 View  01/09/2015   CLINICAL DATA:  Cardiac arrest.  Intubated.  EXAM: PORTABLE CHEST - 1 VIEW  COMPARISON:  12/02/2013.  FINDINGS: Endotracheal tube in satisfactory position. Nasogastric tube extending into the stomach. Stable enlarged cardiac silhouette. Support apparatus obscuring portions of the chest with no gross lung infiltrates. There is a linear lucency overlying the mediastinum on the left. No definite pneumothorax seen.  IMPRESSION: Limited examination with a linear artifact or pneumomediastinum noted on the left.   Electronically Signed   By: Gordan Payment M.D.   On: 01/09/2015 14:27  All radiology studies independently viewed by me.      EKG Interpretation   Date/Time:  Saturday January 09 2015 13:10:25 EST Ventricular Rate:  164 PR Interval:  128 QRS Duration: 123 QT Interval:  324 QTC Calculation: 535 R Axis:   -97 Text Interpretation:  Ventricular tachycardia Baseline wander in lead(s)  V2 compared to prior, now has v tach Confirmed by [facility]  MD, TREY ([id number])  on 01/09/2015 4:24:29 PM      MDM   Final diagnoses:  Cardiac arrest  Altered mental status, unspecified altered mental status type    54 year old male presenting after cardiac arrest. EMS checked and EKG prior to arrival, and called a code STEMI from prehospital setting. Shortly after arrival to the ED, he developed ventricular  tachycardia. I synchronized cardioverted him successfully. I had multiple discussions with Dr. Eldridge Dace regarding his EKG.  It does not appear that his EKG is acutely changed today compared to prior.  He considered cardiac catheterization, but elected to defer for now.  Discussed case with Dr. Molli Knock who will admit to critical care. He was given cold saline and ice packs in ED and decision to cool him further will be made by critical care.    Candyce Churn III, MD 01/09/15 [phone]

## 2015-01-09 NOTE — Progress Notes (Signed)
eLink Physician-Brief Progress Note Patient Name: [name] Nevinsugene N Martinec DOB: 12/05/1961 MRN: [id number]   Date of Service  01/09/2015  HPI/Events of Note  Pt remains hypertensive  eICU Interventions  Will start Cardene drip for goal MAP </= 85mmHg     Intervention Category Major Interventions: Hypertension - evaluation and management  Shan Levansatrick Eowyn Tabone 01/09/2015, 3:47 PM

## 2015-01-09 NOTE — ED Notes (Signed)
Pt found unresponsive at home, CPR started by fire, able to palpate peripheral pulses upon arrival to ED, ST elevation noted on EKG, skin dry. Hemodialysis fistula to R upper arm.

## 2015-01-09 NOTE — Procedures (Signed)
Arterial Catheter Insertion Procedure Note Elfredia Nevinsugene N Rosevear [id number] [birth year]  Procedure: Insertion of Arterial Catheter  Indications: Blood pressure monitoring  Procedure Details Consent: Unable to obtain consent because of emergent medical necessity. Time Out: Verified patient identification, verified procedure, site/side was marked, verified correct patient position, special equipment/implants available, medications/allergies/relevent history reviewed, required imaging and test results available.  Performed  Maximum sterile technique was used including antiseptics, cap, gloves, gown, hand hygiene, mask and sheet. Skin prep: Chlorhexidine; local anesthetic administered 20 gauge catheter was inserted into right radial artery using the Seldinger technique.  Evaluation Blood flow good; BP tracing good. Complications: No apparent complications.   Koren BoundYACOUB,Thomasene Dubow 01/09/2015

## 2015-01-09 NOTE — ED Notes (Signed)
Pt remains in ED. Hypothermia protocol initiated. CCM at bedside attempting to establish central line.

## 2015-01-09 NOTE — Progress Notes (Signed)
Patient came in intubated by EMS with a 8.0 ETT taped at 27 cm at lip MD ordered the ETT advanced to 29 cm at lip due to chest X-Ray, BBS rhonchus with large amount of tan, yellow secretions suctioned from ETT, placed on above settings CCM aware.

## 2015-01-09 NOTE — H&P (Signed)
PULMONARY / CRITICAL CARE MEDICINE   Name: [name] [name] MRN: [id number] DOB: 11-26-1961    ADMISSION DATE:  01/09/2015   REFERRING MD :  ED  CHIEF COMPLAINT:  VT arrest  INITIAL PRESENTATION:Intubated post arrest  STUDIES:  1/16 CT head>> 1/16 cardiac cath>>  SIGNIFICANT EVENTS: 1/16 VT/V F arrest   HISTORY OF PRESENT ILLNESS:   54 yo ESRD , poorly compliant with HD MWF who noted to be slumped over at home. EMS activated and 10 minutes of CPR along with one shock applied. Intubated in the field and transported to [facility] ED and his deemed a hypothermia candidate and taken to Cath lab urgently.  PAST MEDICAL HISTORY :   has a past medical history of Hypertension; End stage renal disease; Arthritis; Polysubstance abuse; Chest pain; Abnormal ECG; LVH (left ventricular hypertrophy); and Hyperkalemia (10/15/2013).  has past surgical history that includes AV fistula placement; Cholecystectomy; Total knee arthroplasty (09/20/2012); Joint replacement (Right, 09/20/12); and left heart catheterization with coronary angiogram (N/A, 05/29/2013). Prior to Admission medications   Medication Sig Start Date End Date Taking? Authorizing Provider  amLODipine (NORVASC) 5 MG tablet Take 5 mg by mouth daily.    Historical Provider, MD  cinacalcet (SENSIPAR) 90 MG tablet Take 90 mg by mouth daily.    Historical Provider, MD  cloNIDine (CATAPRES) 0.1 MG tablet Take 0.1 mg by mouth 2 (two) times daily.    Historical Provider, MD  gabapentin (NEURONTIN) 100 MG capsule Take 100 mg by mouth at bedtime as needed (nerve pain).     Historical Provider, MD  Multiple Vitamins-Minerals (MULTIVITAMIN WITH MINERALS) tablet Take 1 tablet by mouth daily.    Historical Provider, MD  sevelamer (RENAGEL) 800 MG tablet Take 800 mg by mouth 3 (three) times daily with meals.    Historical Provider, MD   No Known Allergies  FAMILY HISTORY:  indicated that his mother is alive. He indicated that his father is deceased.   SOCIAL HISTORY:  reports that he has been smoking Cigarettes.  He has a 9.3 pack-year smoking history. He has never used smokeless tobacco. He reports that he uses illicit drugs (Cocaine). He reports that he does not drink alcohol.  REVIEW OF SYSTEMS: NA  SUBJECTIVE:   VITAL SIGNS: Temp:  [98.1 F (36.7 C)] 98.1 F (36.7 C) (01/16 1312) Pulse Rate:  [77-86] 77 (01/16 1340) Resp:  [14] 14 (01/16 1340) BP: (112-149)/(42-85) 122/57 mmHg (01/16 1340) SpO2:  [99 %-100 %] 99 % (01/16 1340) FiO2 (%):  [100 %] 100 % (01/16 1340) HEMODYNAMICS:   VENTILATOR SETTINGS: Vent Mode:  [-]  FiO2 (%):  [100 %] 100 % INTAKE / OUTPUT: No intake or output data in the 24 hours ending 01/09/15 1354  PHYSICAL EXAMINATION: General:  Obese AAM Neuro:  obtunded HEENT: Left ij av graft Cardiovascular:  HSR Lungs:  Coarse rhonchi Abdomen:  Obese + b s Musculoskeletal:  intact Skin:  Left arm av graft x 2  LABS:  CBC No results for input(s): WBC, HGB, HCT, PLT in the last 168 hours. Coag's No results for input(s): APTT, INR in the last 168 hours. BMET No results for input(s): NA, K, CL, CO2, BUN, CREATININE, GLUCOSE in the last 168 hours. Electrolytes No results for input(s): CALCIUM, MG, PHOS in the last 168 hours. Sepsis Markers No results for input(s): LATICACIDVEN, PROCALCITON, O2SATVEN in the last 168 hours. ABG  Recent Labs Lab 01/09/15 1325  PHART 7.327*  PCO2ART 44.0  PO2ART 273.0*   Liver Enzymes  No results for input(s): AST, ALT, ALKPHOS, BILITOT, ALBUMIN in the last 168 hours. Cardiac Enzymes No results for input(s): TROPONINI, PROBNP in the last 168 hours. Glucose  Recent Labs Lab 01/09/15 1315  GLUCAP 215*    Imaging No results found.   ASSESSMENT / PLAN:  PULMONARY OETT 1/16 per ems>> A: VDRF post VT arrest  P:   Vent bundle  CARDIOVASCULAR CVL A:  Post arrest VT/VF shocked x 3 Hx of CAD P:  Hypothermia protocol if head ct ok Follow  protocol Cards consult  RENAL A:   ESRD HD M W F P:   Renal consult  GASTROINTESTINAL A:   GI protection P:   PPI  HEMATOLOGIC A:   No acute issue P:    INFECTIOUS A:   No infectious process P:     ENDOCRINE A:    No acute issue P:     NEUROLOGIC A:   Post arrest non responsive. P:   RASS goal: o CT head when stable   FAMILY  - Updates:   - Inter-disciplinary family meet or Palliative Care meeting due by:  \day 7    TODAY'S SUMMARY:  54 yo ESRD , poorly compliant with HD MWF who noted to be slumped over at home. EMS activated and 10 minutes of CPR along with one shock applied. Intubated in the field and transported to [facility] ED and his deemed a hypothermia candidate and taken to Cath lab urgently.    Brett Canales Minor ACNP Ad[facility] Pollack PCCM Pager [phone] till 3 pm If no answer page [phone] 01/09/2015, 1:58 PM  Will perform a head CT, if negative then to cath lab and begin hypothermia protocol.  Prognosis is guarded at this point.  If head CT is negative will call cardiology for cath.  The patient is critically ill with multiple organ systems failure and requires high complexity decision making for assessment and support, frequent evaluation and titration of therapies, application of advanced monitoring technologies and extensive interpretation of multiple databases.   Critical Care Time devoted to patient care services described in this note is  60  Minutes. This time reflects time of care of this signee Dr Koren Bound. This critical care time does not reflect procedure time, or teaching time or supervisory time of PA/NP/Med student/Med Resident etc but could involve care discussion time.  Alyson Reedy, M.D. [facility] Pulmonary/Critical Care Medicine. Pager: [phone]. After hours pager: [phone].

## 2015-01-09 NOTE — Progress Notes (Signed)
Pt transported to 2H via vent w/ no apparent complications.  Unit RT aware.  Pt placed back on 60% fio2 per prev. Vent setting, sat 100%.

## 2015-01-10 ENCOUNTER — Inpatient Hospital Stay (HOSPITAL_COMMUNITY): Payer: Medicare Other

## 2015-01-10 DIAGNOSIS — R4182 Altered mental status, unspecified: Secondary | ICD-10-CM

## 2015-01-10 LAB — GLUCOSE, CAPILLARY
GLUCOSE-CAPILLARY: 100 mg/dL — AB (ref 70–99)
GLUCOSE-CAPILLARY: 87 mg/dL (ref 70–99)
GLUCOSE-CAPILLARY: 96 mg/dL (ref 70–99)
GLUCOSE-CAPILLARY: 96 mg/dL (ref 70–99)
Glucose-Capillary: 143 mg/dL — ABNORMAL HIGH (ref 70–99)
Glucose-Capillary: 194 mg/dL — ABNORMAL HIGH (ref 70–99)
Glucose-Capillary: 68 mg/dL — ABNORMAL LOW (ref 70–99)
Glucose-Capillary: 73 mg/dL (ref 70–99)
Glucose-Capillary: 93 mg/dL (ref 70–99)
Glucose-Capillary: 82 mg/dL (ref 70–99)
Glucose-Capillary: 78 mg/dL (ref 70–99)
Glucose-Capillary: 79 mg/dL (ref 70–99)
Glucose-Capillary: 80 mg/dL (ref 70–99)
Glucose-Capillary: 73 mg/dL (ref 70–99)
Glucose-Capillary: 71 mg/dL (ref 70–99)

## 2015-01-10 LAB — BASIC METABOLIC PANEL
ANION GAP: 13 (ref 5–15)
Anion gap: 13 (ref 5–15)
Anion gap: 11 (ref 5–15)
Anion gap: 11 (ref 5–15)
Anion gap: 10 (ref 5–15)
Anion gap: 11 (ref 5–15)
BUN: 46 mg/dL — ABNORMAL HIGH (ref 6–23)
BUN: 49 mg/dL — AB (ref 6–23)
BUN: 52 mg/dL — AB (ref 6–23)
BUN: 54 mg/dL — ABNORMAL HIGH (ref 6–23)
BUN: 56 mg/dL — ABNORMAL HIGH (ref 6–23)
BUN: 58 mg/dL — AB (ref 6–23)
CALCIUM: 8.9 mg/dL (ref 8.4–10.5)
CALCIUM: 9.2 mg/dL (ref 8.4–10.5)
CALCIUM: 9.3 mg/dL (ref 8.4–10.5)
CALCIUM: 9.3 mg/dL (ref 8.4–10.5)
CHLORIDE: 102 meq/L (ref 96–112)
CHLORIDE: 104 meq/L (ref 96–112)
CO2: 22 mmol/L (ref 19–32)
CO2: 25 mmol/L (ref 19–32)
CO2: 25 mmol/L (ref 19–32)
CO2: 22 mmol/L (ref 19–32)
CO2: 23 mmol/L (ref 19–32)
CO2: 23 mmol/L (ref 19–32)
CREATININE: 9.02 mg/dL — AB (ref 0.50–1.35)
CREATININE: 9.41 mg/dL — AB (ref 0.50–1.35)
Calcium: 9.3 mg/dL (ref 8.4–10.5)
Calcium: 8.9 mg/dL (ref 8.4–10.5)
Chloride: 103 mEq/L (ref 96–112)
Chloride: 103 mEq/L (ref 96–112)
Chloride: 104 mEq/L (ref 96–112)
Chloride: 106 mEq/L (ref 96–112)
Creatinine, Ser: 8.86 mg/dL — ABNORMAL HIGH (ref 0.50–1.35)
Creatinine, Ser: 9.23 mg/dL — ABNORMAL HIGH (ref 0.50–1.35)
Creatinine, Ser: 9.46 mg/dL — ABNORMAL HIGH (ref 0.50–1.35)
Creatinine, Ser: 9.74 mg/dL — ABNORMAL HIGH (ref 0.50–1.35)
GFR calc Af Amer: 7 mL/min — ABNORMAL LOW (ref >90)
GFR calc Af Amer: 7 mL/min — ABNORMAL LOW (ref >90)
GFR calc Af Amer: 6 mL/min — ABNORMAL LOW (ref >90)
GFR calc Af Amer: 6 mL/min — ABNORMAL LOW (ref >90)
GFR calc Af Amer: 6 mL/min — ABNORMAL LOW (ref >90)
GFR calc non Af Amer: 6 mL/min — ABNORMAL LOW (ref >90)
GFR calc non Af Amer: 6 mL/min — ABNORMAL LOW (ref >90)
GFR calc non Af Amer: 5 mL/min — ABNORMAL LOW (ref >90)
GFR, EST AFRICAN AMERICAN: 7 mL/min — AB (ref >90)
GFR, EST NON AFRICAN AMERICAN: 6 mL/min — AB (ref >90)
GFR, EST NON AFRICAN AMERICAN: 6 mL/min — AB (ref >90)
GFR, EST NON AFRICAN AMERICAN: 6 mL/min — AB (ref >90)
GLUCOSE: 86 mg/dL (ref 70–99)
GLUCOSE: 102 mg/dL — AB (ref 70–99)
GLUCOSE: 79 mg/dL (ref 70–99)
GLUCOSE: 68 mg/dL — AB (ref 70–99)
Glucose, Bld: 156 mg/dL — ABNORMAL HIGH (ref 70–99)
Glucose, Bld: 91 mg/dL (ref 70–99)
POTASSIUM: 4.6 mmol/L (ref 3.5–5.1)
POTASSIUM: 4.4 mmol/L (ref 3.5–5.1)
POTASSIUM: 6.1 mmol/L — AB (ref 3.5–5.1)
Potassium: 3.9 mmol/L (ref 3.5–5.1)
Potassium: 4.9 mmol/L (ref 3.5–5.1)
Potassium: 6.2 mmol/L (ref 3.5–5.1)
SODIUM: 137 mmol/L (ref 135–145)
SODIUM: 139 mmol/L (ref 135–145)
SODIUM: 139 mmol/L (ref 135–145)
Sodium: 139 mmol/L (ref 135–145)
Sodium: 139 mmol/L (ref 135–145)
Sodium: 138 mmol/L (ref 135–145)

## 2015-01-10 LAB — CBC
HCT: 37.6 % — ABNORMAL LOW (ref 39.0–52.0)
Hemoglobin: 12.5 g/dL — ABNORMAL LOW (ref 13.0–17.0)
MCH: 29.1 pg (ref 26.0–34.0)
MCHC: 33.2 g/dL (ref 30.0–36.0)
MCV: 87.6 fL (ref 78.0–100.0)
PLATELETS: 163 10*3/uL (ref 150–400)
RBC: 4.29 MIL/uL (ref 4.22–5.81)
RDW: 14.5 % (ref 11.5–15.5)
WBC: 15.5 10*3/uL — ABNORMAL HIGH (ref 4.0–10.5)

## 2015-01-10 LAB — BLOOD GAS, ARTERIAL
ACID-BASE DEFICIT: 3.2 mmol/L — AB (ref 0.0–2.0)
Bicarbonate: 21.8 mEq/L (ref 20.0–24.0)
Drawn by: 347621
FIO2: 0.6 %
MECHVT: 600 mL
O2 Saturation: 93.5 %
PEEP: 5 cmH2O
PH ART: 7.329 — AB (ref 7.350–7.450)
Patient temperature: 98.6
RATE: 20 resp/min
TCO2: 23.1 mmol/L (ref 0–100)
pCO2 arterial: 42.7 mmHg (ref 35.0–45.0)
pO2, Arterial: 78.7 mmHg — ABNORMAL LOW (ref 80.0–100.0)

## 2015-01-10 LAB — MAGNESIUM: MAGNESIUM: 2.3 mg/dL (ref 1.5–2.5)

## 2015-01-10 LAB — TROPONIN I
TROPONIN I: 0.78 ng/mL — AB (ref <0.031)
Troponin I: 0.69 ng/mL (ref <0.031)

## 2015-01-10 LAB — PHOSPHORUS: Phosphorus: 5.1 mg/dL — ABNORMAL HIGH (ref 2.3–4.6)

## 2015-01-10 MED ORDER — DEXTROSE 50 % IV SOLN
13.0000 mL | Freq: Once | INTRAVENOUS | Status: AC
  Administered 2015-01-10: 13 mL via INTRAVENOUS

## 2015-01-10 MED ORDER — DOXERCALCIFEROL 4 MCG/2ML IV SOLN
14.0000 ug | INTRAVENOUS | Status: DC
  Administered 2015-01-11 – 2015-01-19 (×4): 14 ug via INTRAVENOUS
  Filled 2015-01-10 (×8): qty 8

## 2015-01-10 MED ORDER — INSULIN ASPART 100 UNIT/ML ~~LOC~~ SOLN: 0.0000 [IU] | SUBCUTANEOUS | Status: DC

## 2015-01-10 MED ORDER — DEXTROSE 50 % IV SOLN
INTRAVENOUS | Status: AC
  Filled 2015-01-10: qty 50

## 2015-01-10 MED ORDER — HYDRALAZINE HCL 20 MG/ML IJ SOLN
20.0000 mg | Freq: Once | INTRAMUSCULAR | Status: AC
  Administered 2015-01-10: 20 mg via INTRAVENOUS
  Filled 2015-01-10: qty 1

## 2015-01-10 NOTE — Progress Notes (Signed)
  Echocardiogram 2D Echocardiogram has been performed.  Delcie RochENNINGTON, Sunya Humbarger 01/10/2015, 9:13 AM

## 2015-01-10 NOTE — Progress Notes (Signed)
CRITICAL VALUE ALERT  Critical value received: K: 6.2  Date of notification:  01/10/2015  Time of notification:  1938  Critical value read back:Yes.    Nurse who received alert:  Deniece ReeMary Alexes Menchaca  MD notified (1st page):  Dr. Dema SeverinMungal  Time of first page:  [phone]  Responding MD:  Dr. Dema SeverinMungal  Time MD responded: [phone]

## 2015-01-10 NOTE — Progress Notes (Signed)
PULMONARY / CRITICAL CARE MEDICINE   Name: Elfredia Nevinsugene N Hino MRN: [id number] DOB: 15-May-1961    ADMISSION DATE:  01/09/2015   REFERRING MD :  ED  CHIEF COMPLAINT:  VT arrest  INITIAL PRESENTATION:Intubated post arrest  PATIENT SUMMARY:   54 yo ESRD , poorly compliant with HD MWF admitted after a cardia arrest on 1/16. He had 10 minutes of CPR along with one shock applied. Intubated in the field. Hypothermia initiated on arrival and taken to Cath lab urgently.  STUDIES:  1/16 CT head>> no acute abnormalities   SIGNIFICANT EVENTS: 1/16 VT/V F arrest 1/16 Hypothermia >> 1/16 CVL right IJ >> 1/16 A-line rt radial >>   SUBJECTIVE:  Sedated. No overnight events.   VITAL SIGNS: Temp:  [89.1 F (31.7 C)-98.1 F (36.7 C)] 91.6 F (33.1 C) (01/17 0600) Pulse Rate:  [60-105] 65 (01/17 0600) Resp:  [0-24] 20 (01/17 0600) BP: (112-259)/(42-128) 167/80 mmHg (01/17 0425) SpO2:  [84 %-100 %] 100 % (01/17 0600) Arterial Line BP: (121-205)/(61-96) 174/79 mmHg (01/17 0600) FiO2 (%):  [60 %-100 %] 60 % (01/17 0425) Weight:  [292 lb 1.8 oz (132.5 kg)-302 lb 11.1 oz (137.3 kg)] 298 lb 4.5 oz (135.3 kg) (01/17 0300) HEMODYNAMICS: CVP:  [7 mmHg-13 mmHg] 7 mmHg VENTILATOR SETTINGS: Vent Mode:  [-] PRVC FiO2 (%):  [60 %-100 %] 60 % Set Rate:  [20 bmp] 20 bmp Vt Set:  [600 mL] 600 mL PEEP:  [5 cmH20] 5 cmH20 Plateau Pressure:  [18 cmH20-26 cmH20] 21 cmH20 INTAKE / OUTPUT:  Intake/Output Summary (Last 24 hours) at 01/10/15 0718 Last data filed at 01/10/15 0600  Gross per 24 hour  Intake 1323.19 ml  Output    590 ml  Net 733.19 ml    PHYSICAL EXAMINATION: General:  Obese AAM Neuro:  obtunded HEENT: Left ij av graft Cardiovascular:  HSR Lungs:  Coarse rhonchi Abdomen:  Obese + b s Musculoskeletal:  Intact,  Skin:  Left arm av graft x 2, protuberant pulsatile. No signs of infecttion LABS:  CBC  Recent Labs Lab 01/09/15 1315  01/09/15 1820 01/09/15 2049 01/10/15 0359   WBC 9.1  --   --   --  15.5*  HGB 11.9*  < > 15.0 14.6 12.5*  HCT 36.9*  < > 44.0 43.0 37.6*  PLT 175  --   --   --  163  < > = values in this interval not displayed. Coag's  Recent Labs Lab 01/09/15 1315 01/09/15 2109 01/09/15 2309  APTT 32 34 33  INR 1.19 1.24 1.23   BMET  Recent Labs Lab 01/09/15 1315 01/09/15 1359  01/09/15 1820 01/09/15 2049 01/09/15 2300  NA 138 136  < > 140 137 137  K 4.6 3.4*  < > 3.7 3.3* 3.9  CL 99 99  < > 102 110 102  CO2 29 21  --   --   --  22  BUN 38* 45*  < > 41* 38* 46*  CREATININE 8.87* 8.51*  < > 8.30* 8.10* 8.86*  GLUCOSE 225* 245*  < > 241* 238* 156*  < > = values in this interval not displayed. Electrolytes  Recent Labs Lab 01/09/15 1315 01/09/15 1359 01/09/15 2300 01/10/15 0359  CALCIUM 9.2 8.8 8.9  --   MG  --  2.3  --  2.3  PHOS  --  6.4*  --  5.1*   Sepsis Markers No results for input(s): LATICACIDVEN, PROCALCITON, O2SATVEN in the last 168 hours. ABG  Recent Labs Lab 01/09/15 1325 01/10/15 0444  PHART 7.327* 7.329*  PCO2ART 44.0 42.7  PO2ART 273.0* 78.7*   Liver Enzymes No results for input(s): AST, ALT, ALKPHOS, BILITOT, ALBUMIN in the last 168 hours. Cardiac Enzymes  Recent Labs Lab 01/09/15 1359 01/10/15 0159  TROPONINI 0.80* 0.78*   Glucose  Recent Labs Lab 01/10/15 0003 01/10/15 0054 01/10/15 0157 01/10/15 0215 01/10/15 0303 01/10/15 0422  GLUCAP 93 73 68* 100* 87 82    Imaging Ct Head Wo Contrast  01/09/2015   CLINICAL DATA:  Post code.  Question anoxic brain injury.  EXAM: CT HEAD WITHOUT CONTRAST  TECHNIQUE: Contiguous axial images were obtained from the base of the skull through the vertex without intravenous contrast.  COMPARISON:  None.  FINDINGS: No acute intracranial abnormality. Specifically, no hemorrhage, hydrocephalus, mass lesion, acute infarction, or significant intracranial injury. No acute calvarial abnormality. No current CT evidence or not brain injury.  Extensive soft  tissue thickening within the nasal airway and ethmoid air cells as well as maxillary sinuses and sphenoid sinus. Air-fluid levels in the sphenoid sinuses. Mastoid air cells are clear.  IMPRESSION: No acute intracranial abnormality.   Electronically Signed   By: Charlett Nose M.D.   On: 01/09/2015 14:40   Dg Chest Port 1 View  01/09/2015   CLINICAL DATA:  Central line placement  EXAM: PORTABLE CHEST - 1 VIEW  COMPARISON:  01/09/2015  FINDINGS: Right internal jugular central line is in place with the tip in the SVC. No pneumothorax. Endotracheal tube and NG tube remain in place, unchanged. Cardiomegaly. Vascular congestion. No real change since prior study.  IMPRESSION: Right central line tip in the SVC.  No pneumothorax.   Electronically Signed   By: Charlett Nose M.D.   On: 01/09/2015 15:20   Dg Chest Portable 1 View  01/09/2015   CLINICAL DATA:  Cardiac arrest.  Intubated.  EXAM: PORTABLE CHEST - 1 VIEW  COMPARISON:  12/02/2013.  FINDINGS: Endotracheal tube in satisfactory position. Nasogastric tube extending into the stomach. Stable enlarged cardiac silhouette. Support apparatus obscuring portions of the chest with no gross lung infiltrates. There is a linear lucency overlying the mediastinum on the left. No definite pneumothorax seen.  IMPRESSION: Limited examination with a linear artifact or pneumomediastinum noted on the left.   Electronically Signed   By: Gordan Payment M.D.   On: 01/09/2015 14:27     ASSESSMENT / PLAN:  PULMONARY OETT 1/16 per ems>> A: VDRF post VT arrest Hypothermia protocol   P:   Vent bundle  CARDIOVASCULAR  A:  Post arrest VT/VF shocked x 3 HTN > improving on Cardene gtt Hx of CAD inferior Q waves and ST elevation inferiorly on old ECGs Mild trop elevation 0.8 >0.78 P:  On Cardene gtt, BP improved Off amiodarone  Hypothermia protocol Follow protocol Card cath deferred pending neurologic improvement Trop elevation stable  Cards following   RENAL A:   ESRD  HD M W F Hypokalemia >> improved. P:   Renal consult Monitor renal function   GASTROINTESTINAL A:   GI protection P:   No active issues PPI for SUP  HEMATOLOGIC A:   Leukocytosis of 15.5 k P:  No interventions. Infections less likely  INFECTIOUS A:   No infectious process.  Leukocytosis is likely WBC demargination P:   No abx for now   ENDOCRINE A:    No acute issue P:   SSI per glycemia protocol  NEUROLOGIC A:   Post arrest non responsive.  Head ct scan 1/16 nl  P:   RASS goal: o  Signed:  Dow Adolph, MD PGY-3 Internal Medicine Teaching Service Pager: [phone] 01/10/2015, 7:46 AM   Continue hypothermia protocol.  Patient sedated and intubated.  Labs all reviewed.  HD planned for today.  Rewarm to be started at 8 PM on 1/17.  Will need a thorough neuro assessment in AM.  No family present bedside.  Continue full code for now.  Cardene for BP control.  TF in AM after warming.    The patient is critically ill with multiple organ systems failure and requires high complexity decision making for assessment and support, frequent evaluation and titration of therapies, application of advanced monitoring technologies and extensive interpretation of multiple databases.   Critical Care Time devoted to patient care services described in this note is  35  Minutes. This time reflects time of care of this signee Dr Koren Bound. This critical care time does not reflect procedure time, or teaching time or supervisory time of PA/NP/Med student/Med Resident etc but could involve care discussion time.  Alyson Reedy, M.D. [facility] Pulmonary/Critical Care Medicine. Pager: [phone]. After hours pager: [phone].

## 2015-01-10 NOTE — Progress Notes (Signed)
UR Completed.  [phone]  

## 2015-01-10 NOTE — Progress Notes (Signed)
eLink Physician-Brief Progress Note Patient Name: [name] [name] DOB: November 08, 1961 MRN: [id number]   Date of Service  01/10/2015  HPI/Events of Note  HTN, sbp in 160s  eICU Interventions  Hydralazine 20mg  IV     Intervention Category Intermediate Interventions: Hypertension - evaluation and management  Victorino Fatzinger 01/10/2015, 8:18 PM

## 2015-01-10 NOTE — Progress Notes (Signed)
Noted on AM assessment patient has a large fistula in left arm that is present with bruit/ thrill. Four large anuryesms noted in left arm at fistula site.

## 2015-01-10 NOTE — Progress Notes (Signed)
Patient Name: [name] [name] Date of Encounter: 01/10/2015     Active Problems:   Cardiac arrest   Acute respiratory failure with hypoxemia   Cardiogenic shock   Anoxic encephalopathy   Altered mental status   DNR (do not resuscitate)    SUBJECTIVE  Patient remains sedated on vent.  [facility] protocol. On Cardene drip for hypertension. Rhythm is normal sinus rhythm with first-degree AV block  CURRENT MEDS . sodium chloride  2,000 mL Intravenous Once  . antiseptic oral rinse  7 mL Mouth Rinse QID  . artificial tears  1 application Both Eyes 3 times per day  . aspirin  300 mg Rectal NOW  . chlorhexidine  15 mL Mouth Rinse BID  . chlorhexidine  15 mL Mouth/Throat Once  . fentaNYL  100 mcg Intravenous Once  . insulin aspart  0-15 Units Subcutaneous 6 times per day  . pantoprazole (PROTONIX) IV  40 mg Intravenous Q24H  . rocuronium  50 mg Intravenous Once    OBJECTIVE  Filed Vitals:   01/10/15 0600 01/10/15 0700 01/10/15 0740 01/10/15 0800  BP:   145/89   Pulse: 65 65 66   Temp: 91.6 F (33.1 C) 92.5 F (33.6 C)  92.7 F (33.7 C)  TempSrc: Core (Comment) Core (Comment)  Core (Comment)  Resp: Height:      Weight:      SpO2: 100% 98% 98%     Intake/Output Summary (Last 24 hours) at 01/10/15 0918 Last data filed at 01/10/15 0600  Gross per 24 hour  Intake 1323.19 ml  Output    590 ml  Net 733.19 ml   Filed Weights   01/09/15 1340 01/09/15 1715 01/10/15 0300  Weight: 292 lb 1.8 oz (132.5 kg) 302 lb 11.1 oz (137.3 kg) 298 lb 4.5 oz (135.3 kg)    PHYSICAL EXAM  General: Unresponsive, intubated.  Sedated.  Neck: Supple without bruits or JVD. Lungs:  Resp regular and unlabored, clear to auscultation anteriorly Heart: RRR no s3, s4, or murmurs. Abdomen: Soft, non-tender, non-distended, BS + x 4.  Extremities: No clubbing, cyanosis or edema. DP/PT/Radials 2+ and equal bilaterally.  Accessory Clinical Findings  CBC  Recent Labs   01/09/15 1315  01/09/15 2049 01/10/15 0359  WBC 9.1  --   --  15.5*  NEUTROABS 5.4  --   --   --   HGB 11.9*  < > 14.6 12.5*  HCT 36.9*  < > 43.0 37.6*  MCV 90.2  --   --  87.6  PLT 175  --   --  163  < > = values in this interval not displayed. Basic Metabolic Panel  Recent Labs  01/09/15 1359  01/09/15 2300 01/10/15 0359 01/10/15 0630  NA 136  < > 137  --  139  K 3.4*  < > 3.9  --  4.6  CL 99  < > 102  --  103  CO2 21  --  22  --  25  GLUCOSE 245*  < > 156*  --  86  BUN 45*  < > 46*  --  49*  CREATININE 8.51*  < > 8.86*  --  9.02*  CALCIUM 8.8  --  8.9  --  9.2  MG 2.3  --   --  2.3  --   PHOS 6.4*  --   --  5.1*  --   < > = values in this interval not displayed. Liver  Function Tests No results for input(s): AST, ALT, ALKPHOS, BILITOT, PROT, ALBUMIN in the last 72 hours. No results for input(s): LIPASE, AMYLASE in the last 72 hours. Cardiac Enzymes  Recent Labs  01/09/15 1359 01/10/15 0159  TROPONINI 0.80* 0.78*   BNP Invalid input(s): POCBNP D-Dimer No results for input(s): DDIMER in the last 72 hours. Hemoglobin A1C No results for input(s): HGBA1C in the last 72 hours. Fasting Lipid Panel No results for input(s): CHOL, HDL, LDLCALC, TRIG, CHOLHDL, LDLDIRECT in the last 72 hours. Thyroid Function Tests No results for input(s): TSH, T4TOTAL, T3FREE, THYROIDAB in the last 72 hours.  Invalid input(s): FREET3  TELE  Normal sinus rhythm with first degree AV block.  ECG  10-Jan-2015 07:43:19 [facility]-CCU ROUTINE RECORD Sinus rhythm with 1st degree A-V block Left axis deviation Inferior infarct , age undetermined Possible Anterolateral infarct , age undetermined Abnormal ECG  Radiology/Studies  Ct Head Wo Contrast  01/09/2015   CLINICAL DATA:  Post code.  Question anoxic brain injury.  EXAM: CT HEAD WITHOUT CONTRAST  TECHNIQUE: Contiguous axial images were obtained from the base of the skull through the vertex without intravenous  contrast.  COMPARISON:  None.  FINDINGS: No acute intracranial abnormality. Specifically, no hemorrhage, hydrocephalus, mass lesion, acute infarction, or significant intracranial injury. No acute calvarial abnormality. No current CT evidence or not brain injury.  Extensive soft tissue thickening within the nasal airway and ethmoid air cells as well as maxillary sinuses and sphenoid sinus. Air-fluid levels in the sphenoid sinuses. Mastoid air cells are clear.  IMPRESSION: No acute intracranial abnormality.   Electronically Signed   By: Charlett NoseKevin  Dover M.D.   On: 01/09/2015 14:40   Dg Chest Port 1 View  01/09/2015   CLINICAL DATA:  Central line placement  EXAM: PORTABLE CHEST - 1 VIEW  COMPARISON:  01/09/2015  FINDINGS: Right internal jugular central line is in place with the tip in the SVC. No pneumothorax. Endotracheal tube and NG tube remain in place, unchanged. Cardiomegaly. Vascular congestion. No real change since prior study.  IMPRESSION: Right central line tip in the SVC.  No pneumothorax.   Electronically Signed   By: Charlett NoseKevin  Dover M.D.   On: 01/09/2015 15:20   Dg Chest Portable 1 View  01/09/2015   CLINICAL DATA:  Cardiac arrest.  Intubated.  EXAM: PORTABLE CHEST - 1 VIEW  COMPARISON:  12/02/2013.  FINDINGS: Endotracheal tube in satisfactory position. Nasogastric tube extending into the stomach. Stable enlarged cardiac silhouette. Support apparatus obscuring portions of the chest with no gross lung infiltrates. There is a linear lucency overlying the mediastinum on the left. No definite pneumothorax seen.  IMPRESSION: Limited examination with a linear artifact or pneumomediastinum noted on the left.   Electronically Signed   By: Gordan PaymentSteve  Reid M.D.   On: 01/09/2015 14:27    ASSESSMENT AND PLAN 1.  Cardiac arrest.  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  His cardiac enzymes are only minimally elevated. 2.  Acute respiratory failure with hypoxemia 3.  Anoxic encephalopathy 4.  End-stage renal  disease 5.  Hypertension on Cardene drip  Plan: [facility]. Assess neurologic status.  The patient had a prolonged down time prior to resuscitation. As per Dr. Hoyle BarrVaranasi's note,If he does show signs of recovery and there is objective evidence of ischemia, would then reconsider cardiac cath.   Signed, Cassell Clementhomas Adarryl Goldammer MD

## 2015-01-10 NOTE — Consult Note (Signed)
Renal Service Consult Note [facility]  [name] [name] 01/10/2015 [name] [name] D Requesting Physician:  Dr [name] [name]  Reason for Consult:  ESRD pt sp arrest HPI: The patient is a 54 y.o. year-old with hist of HTN, substance abuse, ESRD found unresponsive at home and was resuscitated by EMS.  Is on cooling protocol now, K ok today. Asked to see for dialysis. Usual HD MWF.  Does not miss HD usually.   Past Medical History  Past Medical History  Diagnosis Date  . Hypertension   . End stage renal disease     a. MWF dialysis  . Arthritis   . Polysubstance abuse     a. tobacco and cocaine  . Chest pain     a. negative MV in 2004  . Abnormal ECG   . LVH (left ventricular hypertrophy)     a. 01/2013 Echo: EF 60-65%, sev concentric LVH, nl wall motion, sev dil LA.  Marland Kitchen Hyperkalemia 10/15/2013   Past Surgical History  Past Surgical History  Procedure Laterality Date  . Av fistula placement      Left x 2  . Cholecystectomy    . Total knee arthroplasty  09/20/2012    Procedure: TOTAL KNEE ARTHROPLASTY;  Surgeon: [name] Pal, MD;  Location: [facility] OR;  Service: Orthopedics;  Laterality: Right;  right total knee arthroplasty  . Joint replacement Right 09/20/12  . Left heart catheterization with coronary angiogram N/A 05/29/2013    Procedure: LEFT HEART CATHETERIZATION WITH CORONARY ANGIOGRAM;  Surgeon: [name] Bollman, MD;  Location: [facility] CATH LAB;  Service: Cardiovascular;  Laterality: N/A;  . Left heart cath Bilateral 01/09/2015    Procedure: LEFT HEART CATH;  Surgeon: [name] Crafts, MD;  Location: [facility] CATH LAB;  Service: Cardiovascular;  Laterality: Bilateral;   Family History  Family History  Problem Relation Age of Onset  . Kidney failure Brother   . Hypertension Brother   . Kidney failure Brother   . Hypertension Mother   . Diabetes Mother   . Hypertension Father   . Hypertension Sister    Social History  reports that he has been smoking Cigarettes.  He has a 9.3  pack-year smoking history. He has never used smokeless tobacco. He reports that he uses illicit drugs (Cocaine). He reports that he does not drink alcohol. Allergies No Known Allergies Home medications Prior to Admission medications   Medication Sig Start Date End Date Taking? Authorizing Provider  amLODipine (NORVASC) 5 MG tablet Take 5 mg by mouth daily.    Historical Provider, MD  cinacalcet (SENSIPAR) 90 MG tablet Take 90 mg by mouth daily.    Historical Provider, MD  cloNIDine (CATAPRES) 0.1 MG tablet Take 0.1 mg by mouth 2 (two) times daily.    Historical Provider, MD  gabapentin (NEURONTIN) 100 MG capsule Take 100 mg by mouth at bedtime as needed (nerve pain).     Historical Provider, MD  Multiple Vitamins-Minerals (MULTIVITAMIN WITH MINERALS) tablet Take 1 tablet by mouth daily.    Historical Provider, MD  sevelamer (RENAGEL) 800 MG tablet Take 800 mg by mouth 3 (three) times daily with meals.    Historical Provider, MD   Liver Function Tests No results for input(s): AST, ALT, ALKPHOS, BILITOT, PROT, ALBUMIN in the last 168 hours. No results for input(s): LIPASE, AMYLASE in the last 168 hours. CBC  Recent Labs Lab 01/09/15 1315  01/09/15 1820 01/09/15 2049 01/10/15 0359  WBC 9.1  --   --   --  15.5*  NEUTROABS 5.4  --   --   --   --   HGB 11.9*  < > 15.0 14.6 12.5*  HCT 36.9*  < > 44.0 43.0 37.6*  MCV 90.2  --   --   --  87.6  PLT 175  --   --   --  163  < > = values in this interval not displayed. Basic Metabolic Panel  Recent Labs Lab 01/09/15 1315 01/09/15 1359 01/09/15 1412 01/09/15 1738 01/09/15 1820 01/09/15 2049 01/09/15 2300 01/10/15 0359 01/10/15 0630  NA 138 136 136 140 140 137 137  --  139  K 4.6 3.4* 4.5 3.5 3.7 3.3* 3.9  --  4.6  CL 99 99 101 104 102 110 102  --  103  CO2 29 21  --   --   --   --  22  --  25  GLUCOSE 225* 245* 229* 228* 241* 238* 156*  --  86  BUN 38* 45* 41* 40* 41* 38* 46*  --  49*  CREATININE 8.87* 8.51* 8.20* 7.60* 8.30* 8.10*  8.86*  --  9.02*  CALCIUM 9.2 8.8  --   --   --   --  8.9  --  9.2  PHOS  --  6.4*  --   --   --   --   --  5.1*  --     Filed Vitals:   01/10/15 0740 01/10/15 0800 01/10/15 0900 01/10/15 1000  BP: 145/89 160/89    Pulse: 66 68    Temp:  92.7 F (33.7 C) 92.3 F (33.5 C) 91.4 F (33 C)  TempSrc:  Core (Comment) Core (Comment) Core (Comment)  Resp: 22 22    Height:      Weight:      SpO2: 98% 99%     Exam Unresponsive, sedated & paralyzed, on vent, on Cardene drip  + JVD Lungs cleear bilaterally RRR without murmur or rub Abdomen obese, soft, decreased BS No edema upper & lower ezxtremities  Foley catheter in place Aneurysmal AVF @ LUA No rash, cyanosis or gangrene Neuro sedated on vent  HD: MWF [place] 4.5h   500/800  129.5kg   2/2.0 Bath  Heparin 9000    LUA AVF No aranesp or Fe, Hectorol 14 ug tiw   Assessment: 1. S/P cardiac arrest - on vent, cooling protocol, sedated/paralyzed 2. HTN'sive urgency - on Cardene gtt 3. Volume - up 6kg and edema on films 4. ESRD on HD MWF 5. Anemia Hb good, no esa or Fe 6. MBD cont vit D for now, last phos 5, pth 489   Plan- will plan HD later today due to volume overload and pulm edema [name] Moselleob [name] Vanwinkle MD (pgr) [phone]    [phone]) [phone] 01/10/2015, 10:43 AM

## 2015-01-10 NOTE — Progress Notes (Signed)
BMET sent at 0200, still not received results. Called lab twice. Next BMET sent at 0615, awaiting results.

## 2015-01-10 NOTE — Progress Notes (Signed)
CRITICAL VALUE ALERT  Critical value received:  K: 6.1  Date of notification:  01/10/2015  Time of notification:  11:06 PM  Critical value read back:Yes.    Nurse who received alert:  Deniece ReeMary Brenin Heidelberger   Value less than previously drawn critical value (k: 6.2)

## 2015-01-11 ENCOUNTER — Other Ambulatory Visit (HOSPITAL_COMMUNITY): Payer: Medicare Other

## 2015-01-11 ENCOUNTER — Inpatient Hospital Stay (HOSPITAL_COMMUNITY): Payer: Medicare Other

## 2015-01-11 LAB — POCT I-STAT 3, ART BLOOD GAS (G3+)
ACID-BASE DEFICIT: 5 mmol/L — AB (ref 0.0–2.0)
ACID-BASE EXCESS: 1 mmol/L (ref 0.0–2.0)
Bicarbonate: 23.5 mEq/L (ref 20.0–24.0)
Bicarbonate: 28.5 mEq/L — ABNORMAL HIGH (ref 20.0–24.0)
O2 SAT: 94 %
O2 Saturation: 96 %
PCO2 ART: 54.9 mmHg — AB (ref 35.0–45.0)
PH ART: 7.239 — AB (ref 7.350–7.450)
TCO2: 25 mmol/L (ref 0–100)
TCO2: 30 mmol/L (ref 0–100)
pCO2 arterial: 54.9 mmHg — ABNORMAL HIGH (ref 35.0–45.0)
pH, Arterial: 7.323 — ABNORMAL LOW (ref 7.350–7.450)
pO2, Arterial: 97 mmHg (ref 80.0–100.0)
pO2, Arterial: 77 mmHg — ABNORMAL LOW (ref 80.0–100.0)

## 2015-01-11 LAB — GLUCOSE, CAPILLARY
GLUCOSE-CAPILLARY: 58 mg/dL — AB (ref 70–99)
GLUCOSE-CAPILLARY: 75 mg/dL (ref 70–99)
Glucose-Capillary: 102 mg/dL — ABNORMAL HIGH (ref 70–99)
Glucose-Capillary: 33 mg/dL — CL (ref 70–99)
Glucose-Capillary: 42 mg/dL — CL (ref 70–99)
Glucose-Capillary: 49 mg/dL — ABNORMAL LOW (ref 70–99)
Glucose-Capillary: 58 mg/dL — ABNORMAL LOW (ref 70–99)
Glucose-Capillary: 67 mg/dL — ABNORMAL LOW (ref 70–99)
Glucose-Capillary: 68 mg/dL — ABNORMAL LOW (ref 70–99)
Glucose-Capillary: 72 mg/dL (ref 70–99)
Glucose-Capillary: 90 mg/dL (ref 70–99)

## 2015-01-11 LAB — CBC
HCT: 38.4 % — ABNORMAL LOW (ref 39.0–52.0)
Hemoglobin: 13 g/dL (ref 13.0–17.0)
MCH: 29.6 pg (ref 26.0–34.0)
MCHC: 33.9 g/dL (ref 30.0–36.0)
MCV: 87.5 fL (ref 78.0–100.0)
PLATELETS: 188 10*3/uL (ref 150–400)
RBC: 4.39 MIL/uL (ref 4.22–5.81)
RDW: 14.5 % (ref 11.5–15.5)
WBC: 15.3 10*3/uL — ABNORMAL HIGH (ref 4.0–10.5)

## 2015-01-11 LAB — BASIC METABOLIC PANEL
ANION GAP: 7 (ref 5–15)
ANION GAP: 7 (ref 5–15)
Anion gap: 9 (ref 5–15)
BUN: 58 mg/dL — ABNORMAL HIGH (ref 6–23)
BUN: 25 mg/dL — AB (ref 6–23)
BUN: 36 mg/dL — ABNORMAL HIGH (ref 6–23)
CALCIUM: 9.2 mg/dL (ref 8.4–10.5)
CALCIUM: 8.5 mg/dL (ref 8.4–10.5)
CALCIUM: 9.1 mg/dL (ref 8.4–10.5)
CHLORIDE: 100 meq/L (ref 96–112)
CHLORIDE: 98 meq/L (ref 96–112)
CO2: 26 mmol/L (ref 19–32)
CO2: 30 mmol/L (ref 19–32)
CO2: 30 mmol/L (ref 19–32)
CREATININE: 9.68 mg/dL — AB (ref 0.50–1.35)
Chloride: 103 mEq/L (ref 96–112)
Creatinine, Ser: 5.41 mg/dL — ABNORMAL HIGH (ref 0.50–1.35)
Creatinine, Ser: 7.81 mg/dL — ABNORMAL HIGH (ref 0.50–1.35)
GFR calc Af Amer: 13 mL/min — ABNORMAL LOW (ref >90)
GFR calc Af Amer: 8 mL/min — ABNORMAL LOW (ref >90)
GFR calc non Af Amer: 5 mL/min — ABNORMAL LOW (ref >90)
GFR, EST AFRICAN AMERICAN: 6 mL/min — AB (ref >90)
GFR, EST NON AFRICAN AMERICAN: 11 mL/min — AB (ref >90)
GFR, EST NON AFRICAN AMERICAN: 7 mL/min — AB (ref >90)
GLUCOSE: 75 mg/dL (ref 70–99)
GLUCOSE: 77 mg/dL (ref 70–99)
GLUCOSE: 80 mg/dL (ref 70–99)
POTASSIUM: 5.1 mmol/L (ref 3.5–5.1)
Potassium: 5.7 mmol/L — ABNORMAL HIGH (ref 3.5–5.1)
Potassium: 4.4 mmol/L (ref 3.5–5.1)
SODIUM: 136 mmol/L (ref 135–145)
Sodium: 137 mmol/L (ref 135–145)
Sodium: 137 mmol/L (ref 135–145)

## 2015-01-11 MED ORDER — DEXTROSE 50 % IV SOLN
INTRAVENOUS | Status: AC
  Administered 2015-01-11: 50 mL
  Filled 2015-01-11: qty 50

## 2015-01-11 MED ORDER — HYDRALAZINE HCL 20 MG/ML IJ SOLN
10.0000 mg | Freq: Four times a day (QID) | INTRAMUSCULAR | Status: DC | PRN
  Administered 2015-01-12: 10 mg via INTRAVENOUS
  Filled 2015-01-11: qty 1

## 2015-01-11 MED ORDER — LIDOCAINE HCL (PF) 1 % IJ SOLN: 5.0000 mL | INTRAMUSCULAR | Status: DC | PRN

## 2015-01-11 MED ORDER — DEXTROSE 50 % IV SOLN
25.0000 mL | Freq: Once | INTRAVENOUS | Status: AC
  Administered 2015-01-11: 25 mL via INTRAVENOUS

## 2015-01-11 MED ORDER — DEXTROSE 10 % IV SOLN
INTRAVENOUS | Status: DC
  Administered 2015-01-11 – 2015-01-14 (×8): via INTRAVENOUS

## 2015-01-11 MED ORDER — NEPRO/CARBSTEADY PO LIQD
237.0000 mL | ORAL | Status: DC | PRN
  Filled 2015-01-11: qty 237

## 2015-01-11 MED ORDER — ASPIRIN 300 MG RE SUPP
300.0000 mg | Freq: Every day | RECTAL | Status: DC
  Administered 2015-01-11 – 2015-01-13 (×3): 300 mg via RECTAL
  Filled 2015-01-11 (×3): qty 1

## 2015-01-11 MED ORDER — ASPIRIN 81 MG PO CHEW: 81.0000 mg | CHEWABLE_TABLET | Freq: Every day | ORAL | Status: DC

## 2015-01-11 MED ORDER — SODIUM CHLORIDE 0.9 % IV SOLN: 100.0000 mL | INTRAVENOUS | Status: DC | PRN

## 2015-01-11 MED ORDER — HEPARIN SODIUM (PORCINE) 1000 UNIT/ML DIALYSIS: 1000.0000 [IU] | INTRAMUSCULAR | Status: DC | PRN

## 2015-01-11 MED ORDER — ALTEPLASE 2 MG IJ SOLR
2.0000 mg | Freq: Once | INTRAMUSCULAR | Status: AC | PRN
  Filled 2015-01-11: qty 2

## 2015-01-11 MED ORDER — LIDOCAINE-PRILOCAINE 2.5-2.5 % EX CREA
1.0000 "application " | TOPICAL_CREAM | CUTANEOUS | Status: DC | PRN
  Filled 2015-01-11: qty 5

## 2015-01-11 MED ORDER — SODIUM CHLORIDE 0.9 % IV SOLN
8.0000 mg/h | INTRAVENOUS | Status: DC
  Administered 2015-01-12: 8 mg/h via INTRAVENOUS
  Filled 2015-01-11 (×3): qty 80

## 2015-01-11 MED ORDER — DEXTROSE 50 % IV SOLN
25.0000 mL | Freq: Once | INTRAVENOUS | Status: AC
  Administered 2015-01-11: 25 mL via INTRAVENOUS
  Filled 2015-01-11: qty 50

## 2015-01-11 MED ORDER — DEXTROSE 50 % IV SOLN
INTRAVENOUS | Status: AC
  Administered 2015-01-11: 25 mL via INTRAVENOUS
  Filled 2015-01-11: qty 50

## 2015-01-11 MED ORDER — HEPARIN SODIUM (PORCINE) 1000 UNIT/ML DIALYSIS: 3000.0000 [IU] | INTRAMUSCULAR | Status: DC | PRN

## 2015-01-11 MED ORDER — PENTAFLUOROPROP-TETRAFLUOROETH EX AERO: 1.0000 "application " | INHALATION_SPRAY | CUTANEOUS | Status: DC | PRN

## 2015-01-11 NOTE — Progress Notes (Signed)
Pt HD treatment ended 30 mins early. Due to unstable vss and bigeminy on monitor. 02 sat dropped to 90, map to 60, bp to 70/40. Primary RN and MD present. HD MD notified and advised to end pt treatment.

## 2015-01-11 NOTE — Progress Notes (Signed)
RT called by nurse due to patient's sats dropping in the high 70's. Nurse informed RT that they had bathed patient when this occurred. RT increased FIO2 to 100% patient's sats remained at 89. RT contacted the box and Dr. Dema SeverinMungal was told of what had happened. Per Dr. Magdalene Mollyrder RT left FIO2 at 100% and increased PEEP from 5 to 10. Will get a blood gas in 20 min.

## 2015-01-11 NOTE — Progress Notes (Signed)
Patient desaturating after bath per RT - pulse ox low despite peep 8  Plan  increase peep  To 10 and then check abg Check cxr  Dr. Kalman ShanMurali Honesti Seaberg, M.D., [facility]C.P Pulmonary and Critical Care Medicine Staff Physician [facility] [place] [facility] Pager: [phone], If no answer or between  15:00h - 7:00h: call [phone]  01/11/2015 2:21 AM

## 2015-01-11 NOTE — Progress Notes (Signed)
CRITICAL VALUE ALERT  Critical value received:  CBG -59  Date of notification:  01/11/2015  Time of notification:  0730  Critical value read back: yes  Nurse who received alert:C. Rochell Mabie  MD notified (1st page):  At bedside- CICU Resident  Time of first page:   MD notified (2nd page):  Time of second page:  Responding MD:  CICU Resident- Richard  Time MD responded: 0730

## 2015-01-11 NOTE — Progress Notes (Signed)
Dr. Delford FieldWright notified of og drainage appearance of brown, particulate matter, smells like stool. Also notified of dissynchrony with ventilator with attempts to wean sedation.  RASS  goal currently ordered at 0.  Orders received.  Will continue to monitor pt closely.

## 2015-01-11 NOTE — Progress Notes (Signed)
PULMONARY / CRITICAL CARE MEDICINE   Name: DEANO TOMASZEWSKI MRN: [id number] DOB: [birth year]    ADMISSION DATE:  01/09/2015   REFERRING MD :  ED  CHIEF COMPLAINT:  VT arrest  INITIAL PRESENTATION: Intubated post arrest  PATIENT SUMMARY:   54 yo ESRD , poorly compliant with HD MWF admitted after a cardia arrest on 1/16. He had 10 minutes of CPR along with one shock applied. Intubated in the field. Hypothermia initiated on arrival and taken to Cath lab urgently.  STUDIES:  1/16 CT head>> no acute abnormalities   SIGNIFICANT EVENTS: 1/16 VT/V F arrest 1/16 Hypothermia >> 1/16 CVL right IJ >> 1/16 A-line rt radial >>  1/18 HD attempted; did not tolerate it very well due to hypotension, did have some volume removal   SUBJECTIVE:  Desaturated last night 1/17 and PEEP increased.  Oxygen level dropped to as low as 70%.  He was not able to tolerate HD to completion due to low BP. 2L removed.  Started rewarming to complete at 9am  VITAL SIGNS: Temp:  [90 F (32.2 C)-100 F (37.8 C)] 100 F (37.8 C) (01/18 0900) Pulse Rate:  [60-106] 106 (01/18 0900) Resp:  [0-25] 19 (01/18 0900) BP: (102-170)/(58-108) 149/98 mmHg (01/18 0800) SpO2:  [85 %-100 %] 97 % (01/18 0900) Arterial Line BP: (103-174)/(61-96) 137/75 mmHg (01/18 0900) FiO2 (%):  [60 %-100 %] 60 % (01/18 0900) Weight:  [135.3 kg (298 lb 4.5 oz)] 135.3 kg (298 lb 4.5 oz) (01/18 0250) HEMODYNAMICS: CVP:  [7 mmHg-10 mmHg] 9 mmHg VENTILATOR SETTINGS: Vent Mode:  [-] PRVC FiO2 (%):  [60 %-100 %] 60 % Set Rate:  [20 bmp] 20 bmp Vt Set:  [600 mL] 600 mL PEEP:  [5 cmH20-10 cmH20] 5 cmH20 Pressure Support:  [8 cmH20-10 cmH20] 8 cmH20 Plateau Pressure:  [18 cmH20-29 cmH20] 18 cmH20 INTAKE / OUTPUT:  Intake/Output Summary (Last 24 hours) at 01/11/15 0929 Last data filed at 01/11/15 0900  Gross per 24 hour  Intake 1235.35 ml  Output   2866 ml  Net -1630.65 ml    PHYSICAL EXAMINATION: General:  Obese AAM Neuro:   obtunded HEENT: Left ij av graft Cardiovascular:  HSR  Lungs:  Coarse rhonchi, with vent dysychrony at times. Changed to pressure support.  Abdomen:  Obese + b s. OGT with increased secretions this AM Musculoskeletal:  Intact,  Skin:  Left arm av graft x 2, protuberant pulsatile. No signs of infecttion LABS:  CBC  Recent Labs Lab 01/09/15 1315  01/09/15 2049 01/10/15 0359 01/11/15 0230  WBC 9.1  --   --  15.5* 15.3*  HGB 11.9*  < > 14.6 12.5* 13.0  HCT 36.9*  < > 43.0 37.6* 38.4*  PLT 175  --   --  163 188  < > = values in this interval not displayed. Coag's  Recent Labs Lab 01/09/15 1315 01/09/15 2109 01/09/15 2309  APTT 32 34 33  INR 1.19 1.24 1.23   BMET  Recent Labs Lab 01/10/15 2230 01/11/15 0230 01/11/15 0700  NA 138 136 137  K 6.1* 5.7* 4.4  CL 104 103 100  CO2 BUN 58* 58* 25*  CREATININE 9.74* 9.68* 5.41*  GLUCOSE 68* 75 77   Electrolytes  Recent Labs Lab 01/09/15 1359  01/10/15 0359  01/10/15 2230 01/11/15 0230 01/11/15 0700  CALCIUM 8.8  < >  --   < > 8.9 9.2 8.5  MG 2.3  --  2.3  --   --   --   --  PHOS 6.4*  --  5.1*  --   --   --   --   < > = values in this interval not displayed. Sepsis Markers No results for input(s): LATICACIDVEN, PROCALCITON, O2SATVEN in the last 168 hours. ABG  Recent Labs Lab 01/09/15 1325 01/10/15 0444 01/11/15 0243  PHART 7.327* 7.329* 7.239*  PCO2ART 44.0 42.7 54.9*  PO2ART 273.0* 78.7* 97.0   Liver Enzymes No results for input(s): AST, ALT, ALKPHOS, BILITOT, ALBUMIN in the last 168 hours. Cardiac Enzymes  Recent Labs Lab 01/09/15 1359 01/10/15 0159 01/10/15 0637  TROPONINI 0.80* 0.78* 0.69*   Glucose  Recent Labs Lab 01/10/15 1604 01/10/15 1953 01/10/15 2356 01/11/15 0026 01/11/15 0404 01/11/15 0426  GLUCAP 73 71 58* 90 67* 102*    Imaging Dg Chest Port 1 View  01/10/2015   CLINICAL DATA:  Subsequent encounter for altered mental status. History of hypertension.  EXAM:  PORTABLE CHEST - 1 VIEW  COMPARISON:  01/09/2015  FINDINGS: Endotracheal tube difficult to visualize centrally. Nasogastric extends beyond the inferior aspect of the film. Right internal jugular line tip mid SVC.  Numerous leads and wires project over the chest. Cardiomegaly accentuated by AP portable technique. Developing left greater than right pleural effusions. No pneumothorax. Interstitial edema is moderate. Progressive.  IMPRESSION: Worsened aeration, with increased congestive heart failure, developing pleural effusions, and left greater than right bibasilar airspace disease.   Electronically Signed   By: Jeronimo Greaves M.D.   On: 01/10/2015 09:30     ASSESSMENT / PLAN:  PULMONARY OETT 1/16 per ems>> Increased B pleural effusions ?due to increased volume  A: VDRF post VT arrest Hypothermia protocol  Started rewarming last night  P:   Vent bundle Changed to pressure support am 1/18, SBT as able to tolerate Follow CXR, ABG Consider thora if we believe this will facilitate weaning  CARDIOVASCULAR A:  Post arrest VT/VF shocked x 3 HTN > improving on Cardene gtt Hx of CAD inferior Q waves and ST elevation inferiorly on old ECGs Mild trop elevation 0.8 >0.78>0.69 P:  On and off Cardene gtt, BP improved 1/18 Off amiodarone  Hypothermia protocol, rewarming currently, complete 1/18 am Card cath deferred pending neurologic improvement Trop elevation stable, stress NSTEMI Cards following   RENAL A:   ESRD HD M W F Hypokalemia evolved to hyperkalemia.  P:   Appreciate Renal consult HD last night 1/17 but did not tolerate very well. Had to be stopped due to low BP.  2L removed. Monitor renal function   GASTROINTESTINAL A:   GI protection Increased GI secretions, ? SBO / ileus. At some risk for bowel ischemia P:   No active issues PPI for SUP  HEMATOLOGIC A:   Leukocytosis of 15.5 k P:  Follow trend  INFECTIOUS A:   No infectious process.  Leukocytosis is likely  WBC demargination P:   No abx for now  Follow clinically and Cx if fever  ENDOCRINE A:   Hypoglycemia P:   D10 started am 1/18 CBG q4h.   NEUROLOGIC A:   Post arrest non responsive. Head ct scan 1/16 nl  P:   RASS goal: -1 to 0 Consult neurology to assist with prognosis   Case discussed with Dr Delton Coombes.  Signed:  Dow Adolph, MD PGY-3 Internal Medicine Teaching Service Pager: [phone] 01/11/2015, 9:29 AM    Attending Note:  I have examined patient, reviewed labs, studies and notes. I have discussed the case with Dr Zada Girt, and I agree with the  data and plans as amended above. Mr Bing ReeColes has undergone the rewarming process after cardiac arrest, unclear downtime. On exam he was tachypneic when sedation was lifted, currently unresponsive off sedation. RN reports no purposeful movement. He has brown OGT drainage concerning for ileus / SBO. We will lighten sedation and reassess MS, check KUB now, involve neurology for assistance with prognosis. He is at high risk for a neurological injury.  Independent critical care time is 45 minutes.   Levy Pupaobert Analise Glotfelty, MD, PhD 01/11/2015, 9:31 AM [place] [facility] [phone] or if no answer [phone]

## 2015-01-11 NOTE — Progress Notes (Signed)
Patient ID: [name] [name], male   DOB: August 02, 1961, 54 y.o.   MRN: [id number]  [facility] Progress Note    Subjective:   Pt intubated but not responsive, OT tube with chocolate-colored fluid draining, remains critically ill.     Objective:   BP 149/98 mmHg  Pulse 106  Temp(Src) 99 F (37.2 C) (Core (Comment))  Resp 10  Ht  (1.88 m)  Wt 135.3 kg (298 lb 4.5 oz)  BMI 38.28 kg/m2  SpO2 94%  Intake/Output: I/O last 3 completed shifts: In: 2107.4 [I.V.:2057.4; IV Piggyback:50] Out: 590 [Urine:290; Emesis/NG output:300]   Intake/Output this shift:  Total I/O In: 44.5 [I.V.:44.5] Out: 2526 [Emesis/NG output:400; Other:2126] Weight change: 2.8 kg (6 lb 2.8 oz)  Physical Exam: Gen:WD obese AAM intubated and unresponsive WUJ:WJXBJ, no rub Resp:occ rhonchi YNW:GNFAOZHYQM BS Ext:no edema, LUE AVF tortuous and aneurysmal, +T/B  Labs: BMET  Recent Labs Lab 01/09/15 1359  01/10/15 0359 01/10/15 0630 01/10/15 1050 01/10/15 1423 01/10/15 1900 01/10/15 2230 01/11/15 0230 01/11/15 0700  NA 136  < >  --  139 139 139 139 138 136 137  K 3.4*  < >  --  4.6 4.4 4.9 6.2* 6.1* 5.7* 4.4  CL 99  < >  --  103 103 104 106 104 103 100  CO2 21  < >  --  GLUCOSE 245*  < >  --  86 102* 91 79 68* 75 77  BUN 45*  < >  --  49* 52* 54* 56* 58* 58* 25*  CREATININE 8.51*  < >  --  9.02* 9.23* 9.41* 9.46* 9.74* 9.68* 5.41*  CALCIUM 8.8  < >  --  9.2 9.3 9.3 9.3 8.9 9.2 8.5  PHOS 6.4*  --  5.1*  --   --   --   --   --   --   --   < > = values in this interval not displayed. CBC  Recent Labs Lab 01/09/15 1315  01/09/15 1820 01/09/15 2049 01/10/15 0359 01/11/15 0230  WBC 9.1  --   --   --  15.5* 15.3*  NEUTROABS 5.4  --   --   --   --   --   HGB 11.9*  < > 15.0 14.6 12.5* 13.0  HCT 36.9*  < > 44.0 43.0 37.6* 38.4*  MCV 90.2  --   --   --  87.6 87.5  PLT 175  --   --   --  163 188  < > = values in this interval not  displayed.  @ Medications:    . sodium chloride  2,000 mL Intravenous Once  . antiseptic oral rinse  7 mL Mouth Rinse QID  . aspirin  81 mg Per Tube Daily  . chlorhexidine  15 mL Mouth Rinse BID  . chlorhexidine  15 mL Mouth/Throat Once  . doxercalciferol  14 mcg Intravenous Q M,W,F-HD  . fentaNYL  100 mcg Intravenous Once  . insulin aspart  0-15 Units Subcutaneous 6 times per day  . pantoprazole (PROTONIX) IV  40 mg Intravenous Q24H  . rocuronium  50 mg Intravenous Once     Assessment/ Plan:   1. S/p Cardiac arrest (VT/VF) s/p defib x 3 with prolonged down time.  Plan per cardiology 2. VDRF- per PCCM 3. ESRD- had abbreviated session last night but stopped due to hypotension.  Will try again tomorrow unless PCCM feels that he  needs more aggressive volume removal and then could transition to CVVHD, however will plan for HD tomorrow. 4. Anemia: stable 5. Hypoglycemia- recurrent.  Will start D10 and follow 6. Nutrition:npo for now due to concern for SBO (worrisome for fecal material in OG tube) 7. Hypertension: stable 8. Polysubstance abuse- tox screen pending.  Junah Yam A 01/11/2015, 9:13 AM

## 2015-01-11 NOTE — Progress Notes (Signed)
CRITICAL VALUE ALERT  Critical value received:  59 Date of notification:  01/11/2015  Time of notification:  0931  Critical value read back: yes  Nurse who received alert:  C. Hektor Huston  MD notified (1st page): 01/11/15  Time of first page:  0931  MD notified (2nd page):  Time of second page:  Responding MD:  Coladonato  Time MD responded:  [phone]

## 2015-01-11 NOTE — Progress Notes (Signed)
RN spoke with Kathlene NovemberMike, CDS. He states he spoke with his Wellsite geologistmedical director and they do not believe he is a candidate, at this. CDS requests they be called if there are brain death testing or a cardiac TOD.

## 2015-01-11 NOTE — Progress Notes (Signed)
Pt referred to CDS. Ref # [id number]. Spoke with [name] [name].

## 2015-01-11 NOTE — Progress Notes (Signed)
Report received from R. Luan Pullingich, HD RN. Pt sedated, on vent. Unifocal PVC's, so MD paged. Ordered to put on 4k bath. Has not resolved issue as of yet. 30 mins of HD treatment left. Will continue to monitor. Primary RN aware also.

## 2015-01-11 NOTE — Progress Notes (Addendum)
RN noticing reddish returns from Og/NG tube  Plan Stat cbc Continue protonix but chagne to gtt  Dr. Kalman ShanMurali Nikolos [name], M.D., [facility] Center[facility].C.C.P Pulmonary and Critical Care Medicine Staff Physician [facility] and Critical Care Pager: [phone], If no answer or between  15:00h - 7:00h: call [phone]  01/11/2015 11:53 PM   Addendum  Recent Labs Lab 01/09/15 1820 01/09/15 2049 01/10/15 0359 01/11/15 0230 01/12/15 0001  HGB 15.0 14.6 12.5* 13.0 11.9*    Hgb steady Continue protonix gtt Next hgb check in AM  Dr. Kalman ShanMurali Asaad [name], M.D., [facility].C.P Pulmonary and Critical Care Medicine Staff Physician [facility]  Pulmonary and Critical Care Pager: [phone], If no answer or between  15:00h - 7:00h: call [phone]  01/12/2015 12:36 AM

## 2015-01-11 NOTE — Plan of Care (Signed)
Problem: Phase I Progression Outcomes Goal: Cool target temp reached 2 hrs from start Outcome: Not Met (add Reason) Cooling temp not reached 2 hours from start in emergency department.

## 2015-01-11 NOTE — Progress Notes (Signed)
Pt getting hemodialysis Tzx. Was to keep MAP > 80 per ICU doctor(artic sun protocol).  Frequently dropped MAP below 80 making it difficult to have pt in UF. Also, 2/3 the way through the Treatment he began having multifocal PVC's.Dr. Arlean HoppingSchertz called and bath changed to 4K. Tx is to be stopped if ectopy does not improve.

## 2015-01-11 NOTE — Progress Notes (Signed)
Patient ID: [name] [name], male   DOB: September 24, 1961, 54 y.o.   MRN: [id number]   SUBJECTIVE: Patient is being re-warmed today.  No purposeful actions so far, minimal gag reflex.   Scheduled Meds: . sodium chloride  2,000 mL Intravenous Once  . antiseptic oral rinse  7 mL Mouth Rinse QID  . artificial tears  1 application Both Eyes 3 times per day  . aspirin  81 mg Per Tube Daily  . chlorhexidine  15 mL Mouth Rinse BID  . chlorhexidine  15 mL Mouth/Throat Once  . doxercalciferol  14 mcg Intravenous Q M,W,F-HD  . fentaNYL  100 mcg Intravenous Once  . insulin aspart  0-15 Units Subcutaneous 6 times per day  . pantoprazole (PROTONIX) IV  40 mg Intravenous Q24H  . rocuronium  50 mg Intravenous Once   Continuous Infusions: . sodium chloride 10 mL/hr at 01/10/15 2000  . cisatracurium (NIMBEX) infusion Stopped (01/11/15 0549)  . fentaNYL infusion INTRAVENOUS 225 mcg/hr (01/11/15 0748)  . midazolam (VERSED) infusion 2 mg/hr (01/11/15 0748)  . niCARDipine Stopped (01/10/15 1430)  . norepinephrine (LEVOPHED) Adult infusion Stopped (01/09/15 2032)   PRN Meds:.sodium chloride, sodium chloride, alteplase, calcium chloride, [COMPLETED] cisatracurium **AND** cisatracurium (NIMBEX) infusion **AND** cisatracurium, feeding supplement (NEPRO CARB STEADY), fentaNYL, heparin, [START ON 01/12/2015] heparin, lidocaine (PF), lidocaine-prilocaine, midazolam, pentafluoroprop-tetrafluoroeth    Filed Vitals:   01/11/15 0700 01/11/15 0713 01/11/15 0739 01/11/15 0800  BP: 104/63 151/83 121/84 149/98  Pulse: 101 100 104 106  Temp: 97.5 F (36.4 C) 97.9 F (36.6 C)  99 F (37.2 C)  TempSrc: Core (Comment) Core (Comment)  Core (Comment)  Resp: 18 22 12 10   Height:      Weight:      SpO2: 97% 91% 97% 94%    Intake/Output Summary (Last 24 hours) at 01/11/15 0906 Last data filed at 01/11/15 0800  Gross per 24 hour  Intake 1200.85 ml  Output   2566 ml  Net -1365.15 ml    LABS: Basic Metabolic  Panel:  Recent Labs  01/09/15 1359  01/10/15 0359  01/11/15 0230 01/11/15 0700  NA 136  < >  --   < > 136 137  K 3.4*  < >  --   < > 5.7* 4.4  CL 99  < >  --   < > 103 100  CO2 21  < >  --   < > 26 30  GLUCOSE 245*  < >  --   < > 75 77  BUN 45*  < >  --   < > 58* 25*  CREATININE 8.51*  < >  --   < > 9.68* 5.41*  CALCIUM 8.8  < >  --   < > 9.2 8.5  MG 2.3  --  2.3  --   --   --   PHOS 6.4*  --  5.1*  --   --   --   < > = values in this interval not displayed. Liver Function Tests: No results for input(s): AST, ALT, ALKPHOS, BILITOT, PROT, ALBUMIN in the last 72 hours. No results for input(s): LIPASE, AMYLASE in the last 72 hours. CBC:  Recent Labs  01/09/15 1315  01/10/15 0359 01/11/15 0230  WBC 9.1  --  15.5* 15.3*  NEUTROABS 5.4  --   --   --   HGB 11.9*  < > 12.5* 13.0  HCT 36.9*  < > 37.6* 38.4*  MCV 90.2  --  87.6  87.5  PLT 175  --  163 188  < > = values in this interval not displayed. Cardiac Enzymes:  Recent Labs  01/09/15 1359 01/10/15 0159 01/10/15 0637  TROPONINI 0.80* 0.78* 0.69*   BNP: Invalid input(s): POCBNP D-Dimer: No results for input(s): DDIMER in the last 72 hours. Hemoglobin A1C: No results for input(s): HGBA1C in the last 72 hours. Fasting Lipid Panel: No results for input(s): CHOL, HDL, LDLCALC, TRIG, CHOLHDL, LDLDIRECT in the last 72 hours. Thyroid Function Tests: No results for input(s): TSH, T4TOTAL, T3FREE, THYROIDAB in the last 72 hours.  Invalid input(s): FREET3 Anemia Panel: No results for input(s): VITAMINB12, FOLATE, FERRITIN, TIBC, IRON, RETICCTPCT in the last 72 hours.  RADIOLOGY: Ct Head Wo Contrast  01/09/2015   CLINICAL DATA:  Post code.  Question anoxic brain injury.  EXAM: CT HEAD WITHOUT CONTRAST  TECHNIQUE: Contiguous axial images were obtained from the base of the skull through the vertex without intravenous contrast.  COMPARISON:  None.  FINDINGS: No acute intracranial abnormality. Specifically, no hemorrhage,  hydrocephalus, mass lesion, acute infarction, or significant intracranial injury. No acute calvarial abnormality. No current CT evidence or not brain injury.  Extensive soft tissue thickening within the nasal airway and ethmoid air cells as well as maxillary sinuses and sphenoid sinus. Air-fluid levels in the sphenoid sinuses. Mastoid air cells are clear.  IMPRESSION: No acute intracranial abnormality.   Electronically Signed   By: Charlett Nose M.D.   On: 01/09/2015 14:40   Dg Chest Port 1 View  01/11/2015   CLINICAL DATA:  Acute respiratory failure.  EXAM: PORTABLE CHEST - 1 VIEW  COMPARISON:  01/10/2015  FINDINGS: There is an endotracheal tube with tip at the clavicular heads. A gastric suction tube reaches the diaphragm. Stable positioning of right IJ central line, tip at the SVC.  Unchanged cardiomegaly.  Unchanged upper mediastinal contours.  Central pulmonary venous congestion. Increased hazy lower lung opacities suggesting layering pleural fluid. There are probable superimposed lower lung opacities. No pneumothorax.  IMPRESSION: 1. Unchanged positioning of tubes and central line. 2. Increasing or layering bilateral pleural effusions. Pulmonary venous congestion and basilar lung opacification is unchanged.   Electronically Signed   By: Tiburcio Pea M.D.   On: 01/11/2015 02:55   Dg Chest Port 1 View  01/10/2015   CLINICAL DATA:  Subsequent encounter for altered mental status. History of hypertension.  EXAM: PORTABLE CHEST - 1 VIEW  COMPARISON:  01/09/2015  FINDINGS: Endotracheal tube difficult to visualize centrally. Nasogastric extends beyond the inferior aspect of the film. Right internal jugular line tip mid SVC.  Numerous leads and wires project over the chest. Cardiomegaly accentuated by AP portable technique. Developing left greater than right pleural effusions. No pneumothorax. Interstitial edema is moderate. Progressive.  IMPRESSION: Worsened aeration, with increased congestive heart failure,  developing pleural effusions, and left greater than right bibasilar airspace disease.   Electronically Signed   By: Jeronimo Greaves M.D.   On: 01/10/2015 09:30   Dg Chest Port 1 View  01/09/2015   CLINICAL DATA:  Central line placement  EXAM: PORTABLE CHEST - 1 VIEW  COMPARISON:  01/09/2015  FINDINGS: Right internal jugular central line is in place with the tip in the SVC. No pneumothorax. Endotracheal tube and NG tube remain in place, unchanged. Cardiomegaly. Vascular congestion. No real change since prior study.  IMPRESSION: Right central line tip in the SVC.  No pneumothorax.   Electronically Signed   By: Charlett Nose M.D.   On:  01/09/2015 15:20   Dg Chest Portable 1 View  01/09/2015   CLINICAL DATA:  Cardiac arrest.  Intubated.  EXAM: PORTABLE CHEST - 1 VIEW  COMPARISON:  12/02/2013.  FINDINGS: Endotracheal tube in satisfactory position. Nasogastric tube extending into the stomach. Stable enlarged cardiac silhouette. Support apparatus obscuring portions of the chest with no gross lung infiltrates. There is a linear lucency overlying the mediastinum on the left. No definite pneumothorax seen.  IMPRESSION: Limited examination with a linear artifact or pneumomediastinum noted on the left.   Electronically Signed   By: Gordan Payment M.D.   On: 01/09/2015 14:27    PHYSICAL EXAM General: Sedated on vent.  Neck: No JVD, no thyromegaly or thyroid nodule.  Lungs: Coarse breath sounds bilaterally.  CV: Nondisplaced PMI.  Heart regular S1/S2, no S3/S4, no murmur.  No peripheral edema.   Abdomen: Soft, nontender, no hepatosplenomegaly, no distention.  Neurologic: Sedated, no purposeful response.  Extremities: No clubbing or cyanosis.   TELEMETRY: Reviewed telemetry pt in [facility]  ASSESSMENT AND PLAN: 54 yo with history of ESRD and CAD at prior cath was admitted after cardiac arrest.  1. Cardiac arrest: VT/VF with defibrillation x 3.  Prolonged down-time.  He underwent hypothermia and is now being rewarmed.  EF  55-60% on echo.  No significant change on ECG from prior.  No purposeful movement so far.  Possible ischemic event though troponin only rose to 0.8.  - If patient shows neurologic recovery, will need cardiac cath.  - Add ASA 81 per tube.  2. Anoxic encephalopathy: See above, continue to monitor for recovery.  3. ESRD: BP dropping with HD, off BP meds.  4. HTN: Will give prn hydralazine.    Marca Ancona 01/11/2015 9:11 AM

## 2015-01-11 NOTE — Consult Note (Signed)
NEURO HOSPITALIST CONSULT NOTE    Reason for Consult: prognostication after cardiac arrest   HPI:                                                                                                                                          [name] [name] is an 54 y.o. male with a past medical history significant for HTN, CAD, ESRD on dialysis,amitted to [facility] s/p cardiac arrest (VT/VF) s/p defib x 3 with prolonged down time, who has remained unresponsive since admission and thus a neurological consultation has been requested regarding prognostication. Patient underwent hypothermia protocol and rewarming was complete around 8 am today. Initial CT brain without acute abnormality. No reported seizures or myoclonus reported up until this moment. Nurse reports that patient has a gag and a weak cough.  Patient is on midazolam infusion. Serologies significant for Cr 5.41, WBC >15,00 Had abbreviated HD session last night but stopped due to hypotension.  Past Medical History  Diagnosis Date  . Hypertension   . End stage renal disease     a. MWF dialysis  . Arthritis   . Polysubstance abuse     a. tobacco and cocaine  . Chest pain     a. negative MV in 2004  . Abnormal ECG   . LVH (left ventricular hypertrophy)     a. 01/2013 Echo: EF 60-65%, sev concentric LVH, nl wall motion, sev dil LA.  Marland Kitchen Hyperkalemia 10/15/2013    Past Surgical History  Procedure Laterality Date  . Av fistula placement      Left x 2  . Cholecystectomy    . Total knee arthroplasty  09/20/2012    Procedure: TOTAL KNEE ARTHROPLASTY;  Surgeon: Mauri Pole, MD;  Location: [facility];  Service: Orthopedics;  Laterality: Right;  right total knee arthroplasty  . Joint replacement Right 09/20/12  . Left heart catheterization with coronary angiogram N/A 05/29/2013    Procedure: LEFT HEART CATHETERIZATION WITH CORONARY ANGIOGRAM;  Surgeon: Sherren Mocha, MD;  Location: [facility] CATH LAB;  Service: Cardiovascular;   Laterality: N/A;  . Left heart cath Bilateral 01/09/2015    Procedure: LEFT HEART CATH;  Surgeon: Jettie Booze, MD;  Location: [facility] CATH LAB;  Service: Cardiovascular;  Laterality: Bilateral;    Family History  Problem Relation Age of Onset  . Kidney failure Brother   . Hypertension Brother   . Kidney failure Brother   . Hypertension Mother   . Diabetes Mother   . Hypertension Father   . Hypertension Sister     Family History: impossible to obtain due to mental status and family unavailable at this time.   Social History:  reports that he has been smoking Cigarettes.  He has a 9.3 pack-year smoking history. He has never used  smokeless tobacco. He reports that he uses illicit drugs (Cocaine). He reports that he does not drink alcohol.  No Known Allergies  MEDICATIONS:                                                                                                                     Scheduled: . antiseptic oral rinse  7 mL Mouth Rinse QID  . aspirin  300 mg Rectal Daily  . chlorhexidine  15 mL Mouth Rinse BID  . doxercalciferol  14 mcg Intravenous Q M,W,F-HD  . insulin aspart  0-15 Units Subcutaneous 6 times per day  . pantoprazole (PROTONIX) IV  40 mg Intravenous Q24H     ROS: unable to obtain due to mental status                                                                                                                   History obtained from chart review  Physical exam: sedated, intubated on the vent. Blood pressure 168/101, pulse 90, temperature 97.7 F (36.5 C), temperature source Core (Comment), resp. rate 20, height _0  (1.88 m), weight 135.3 kg (298 lb 4.5 oz), SpO2 99 %. Head: normocephalic. Neck: supple, no bruits, no JVD. Cardiac: no murmurs. Lungs: clear. Abdomen: soft, no tender, no mass. Extremities: no edema. Skin: no rash Neurologic Examination:                                                                                                       Mental status: patient open eyes upon painful examination, remains with eyes open throughout my exam but doesn't tracks. Pupils 4 mm, reactive. EOM absent. Corneal reflexes present. Tongue:intubated. Motor: moves lower greater than upper extremities upon painful examination. Sensory: reacts by withdrawing extremities to pain DTR's: 1+ all over. Plantars: mute. Coordination and gait: unable to test.  Lab Results  Component Value Date/Time   CHOL 159 05/29/2013 06:00 AM    Results for orders placed or performed during the hospital encounter of 01/09/15 (from the past 48 hour(s))  Glucose, capillary     Status: Abnormal  Collection Time: 01/09/15 10:56 PM  Result Value Ref Range   Glucose-Capillary 143 (H) 70 - 99 mg/dL   Comment 1 Venous Sample   Basic metabolic panel     Status: Abnormal   Collection Time: 01/09/15 11:00 PM  Result Value Ref Range   Sodium 137 135 - 145 mmol/L    Comment: Please note change in reference range.   Potassium 3.9 3.5 - 5.1 mmol/L    Comment: Please note change in reference range.   Chloride 102 96 - 112 mEq/L   CO2 22 19 - 32 mmol/L   Glucose, Bld 156 (H) 70 - 99 mg/dL   BUN 46 (H) 6 - 23 mg/dL   Creatinine, Ser 8.86 (H) 0.50 - 1.35 mg/dL   Calcium 8.9 8.4 - 10.5 mg/dL   GFR calc non Af Amer 6 (L) >90 mL/min   GFR calc Af Amer 7 (L) >90 mL/min    Comment: (NOTE) The eGFR has been calculated using the CKD EPI equation. This calculation has not been validated in all clinical situations. eGFR's persistently <90 mL/min signify possible Chronic Kidney Disease.    Anion gap 13 5 - 15  Protime-INR now and repeat in 8 hours     Status: Abnormal   Collection Time: 01/09/15 11:09 PM  Result Value Ref Range   Prothrombin Time 15.6 (H) 11.6 - 15.2 seconds   INR 1.23 0.00 - 1.49  APTT now and repeat in 8 hours     Status: None   Collection Time: 01/09/15 11:09 PM  Result Value Ref Range   aPTT 33 24 - 37 seconds  Glucose, capillary     Status: None    Collection Time: 01/10/15 12:03 AM  Result Value Ref Range   Glucose-Capillary 93 70 - 99 mg/dL  Glucose, capillary     Status: None   Collection Time: 01/10/15 12:54 AM  Result Value Ref Range   Glucose-Capillary 73 70 - 99 mg/dL   Comment 1 Venous Sample   Glucose, capillary     Status: Abnormal   Collection Time: 01/10/15  1:57 AM  Result Value Ref Range   Glucose-Capillary 68 (L) 70 - 99 mg/dL  Troponin I     Status: Abnormal   Collection Time: 01/10/15  1:59 AM  Result Value Ref Range   Troponin I 0.78 (HH) <0.031 ng/mL    Comment:        POSSIBLE MYOCARDIAL ISCHEMIA. SERIAL TESTING RECOMMENDED. Please note change in reference range. REPEATED TO VERIFY CRITICAL VALUE NOTED.  VALUE IS CONSISTENT WITH PREVIOUSLY REPORTED AND CALLED VALUE.   Glucose, capillary     Status: Abnormal   Collection Time: 01/10/15  2:15 AM  Result Value Ref Range   Glucose-Capillary 100 (H) 70 - 99 mg/dL  Glucose, capillary     Status: None   Collection Time: 01/10/15  3:03 AM  Result Value Ref Range   Glucose-Capillary 87 70 - 99 mg/dL   Comment 1 Venous Sample   CBC     Status: Abnormal   Collection Time: 01/10/15  3:59 AM  Result Value Ref Range   WBC 15.5 (H) 4.0 - 10.5 K/uL   RBC 4.29 4.22 - 5.81 MIL/uL   Hemoglobin 12.5 (L) 13.0 - 17.0 g/dL   HCT 37.6 (L) 39.0 - 52.0 %   MCV 87.6 78.0 - 100.0 fL   MCH 29.1 26.0 - 34.0 pg   MCHC 33.2 30.0 - 36.0 g/dL   RDW 14.5 11.5 - 15.5 %  Platelets 163 150 - 400 K/uL  Magnesium     Status: None   Collection Time: 01/10/15  3:59 AM  Result Value Ref Range   Magnesium 2.3 1.5 - 2.5 mg/dL  Phosphorus     Status: Abnormal   Collection Time: 01/10/15  3:59 AM  Result Value Ref Range   Phosphorus 5.1 (H) 2.3 - 4.6 mg/dL  Glucose, capillary     Status: None   Collection Time: 01/10/15  4:22 AM  Result Value Ref Range   Glucose-Capillary 82 70 - 99 mg/dL   Comment 1 Venous Sample   Blood gas, arterial     Status: Abnormal   Collection Time:  01/10/15  4:44 AM  Result Value Ref Range   FIO2 0.60 %   Delivery systems VENTILATOR    Mode PRESSURE REGULATED VOLUME CONTROL    VT 600 mL   Rate 20 resp/min   Peep/cpap 5.0 cm H20   pH, Arterial 7.329 (L) 7.350 - 7.450   pCO2 arterial 42.7 35.0 - 45.0 mmHg   pO2, Arterial 78.7 (L) 80.0 - 100.0 mmHg   Bicarbonate 21.8 20.0 - 24.0 mEq/L   TCO2 23.1 0 - 100 mmol/L   Acid-base deficit 3.2 (H) 0.0 - 2.0 mmol/L   O2 Saturation 93.5 %   Patient temperature 98.6    Collection site ARTERIAL LINE    Drawn by [id number]    Sample type ARTERIAL   Glucose, capillary     Status: None   Collection Time: 01/10/15  6:22 AM  Result Value Ref Range   Glucose-Capillary 79 70 - 99 mg/dL  Basic metabolic panel     Status: Abnormal   Collection Time: 01/10/15  6:30 AM  Result Value Ref Range   Sodium 139 135 - 145 mmol/L    Comment: Please note change in reference range.   Potassium 4.6 3.5 - 5.1 mmol/L    Comment: Please note change in reference range.   Chloride 103 96 - 112 mEq/L   CO2 25 19 - 32 mmol/L   Glucose, Bld 86 70 - 99 mg/dL   BUN 49 (H) 6 - 23 mg/dL   Creatinine, Ser 9.02 (H) 0.50 - 1.35 mg/dL   Calcium 9.2 8.4 - 10.5 mg/dL   GFR calc non Af Amer 6 (L) >90 mL/min   GFR calc Af Amer 7 (L) >90 mL/min    Comment: (NOTE) The eGFR has been calculated using the CKD EPI equation. This calculation has not been validated in all clinical situations. eGFR's persistently <90 mL/min signify possible Chronic Kidney Disease.    Anion gap 11 5 - 15  Troponin I     Status: Abnormal   Collection Time: 01/10/15  6:37 AM  Result Value Ref Range   Troponin I 0.69 (HH) <0.031 ng/mL    Comment:        POSSIBLE MYOCARDIAL ISCHEMIA. SERIAL TESTING RECOMMENDED. Please note change in reference range. REPEATED TO VERIFY CRITICAL VALUE NOTED.  VALUE IS CONSISTENT WITH PREVIOUSLY REPORTED AND CALLED VALUE.   Glucose, capillary     Status: None   Collection Time: 01/10/15  8:10 AM  Result Value Ref  Range   Glucose-Capillary 78 70 - 99 mg/dL  Basic metabolic panel     Status: Abnormal   Collection Time: 01/10/15 10:50 AM  Result Value Ref Range   Sodium 139 135 - 145 mmol/L    Comment: Please note change in reference range.   Potassium 4.4 3.5 - 5.1 mmol/L  Comment: Please note change in reference range.   Chloride 103 96 - 112 mEq/L   CO2 25 19 - 32 mmol/L   Glucose, Bld 102 (H) 70 - 99 mg/dL   BUN 52 (H) 6 - 23 mg/dL   Creatinine, Ser 9.23 (H) 0.50 - 1.35 mg/dL   Calcium 9.3 8.4 - 10.5 mg/dL   GFR calc non Af Amer 6 (L) >90 mL/min   GFR calc Af Amer 7 (L) >90 mL/min    Comment: (NOTE) The eGFR has been calculated using the CKD EPI equation. This calculation has not been validated in all clinical situations. eGFR's persistently <90 mL/min signify possible Chronic Kidney Disease.    Anion gap 11 5 - 15  Glucose, capillary     Status: None   Collection Time: 01/10/15 10:53 AM  Result Value Ref Range   Glucose-Capillary 96 70 - 99 mg/dL  Glucose, capillary     Status: None   Collection Time: 01/10/15 12:24 PM  Result Value Ref Range   Glucose-Capillary 96 70 - 99 mg/dL  Glucose, capillary     Status: None   Collection Time: 01/10/15  2:22 PM  Result Value Ref Range   Glucose-Capillary 80 70 - 99 mg/dL  Basic metabolic panel     Status: Abnormal   Collection Time: 01/10/15  2:23 PM  Result Value Ref Range   Sodium 139 135 - 145 mmol/L    Comment: Please note change in reference range.   Potassium 4.9 3.5 - 5.1 mmol/L    Comment: Please note change in reference range.   Chloride 104 96 - 112 mEq/L   CO2 22 19 - 32 mmol/L   Glucose, Bld 91 70 - 99 mg/dL   BUN 54 (H) 6 - 23 mg/dL   Creatinine, Ser 9.41 (H) 0.50 - 1.35 mg/dL   Calcium 9.3 8.4 - 10.5 mg/dL   GFR calc non Af Amer 6 (L) >90 mL/min   GFR calc Af Amer 6 (L) >90 mL/min    Comment: (NOTE) The eGFR has been calculated using the CKD EPI equation. This calculation has not been validated in all clinical  situations. eGFR's persistently <90 mL/min signify possible Chronic Kidney Disease.    Anion gap 13 5 - 15  Glucose, capillary     Status: None   Collection Time: 01/10/15  4:04 PM  Result Value Ref Range   Glucose-Capillary 73 70 - 99 mg/dL  Basic metabolic panel     Status: Abnormal   Collection Time: 01/10/15  7:00 PM  Result Value Ref Range   Sodium 139 135 - 145 mmol/L    Comment: Please note change in reference range.   Potassium 6.2 (HH) 3.5 - 5.1 mmol/L    Comment: Please note change in reference range. REPEATED TO VERIFY CRITICAL RESULT CALLED TO, READ BACK BY AND VERIFIED WITH: G.HODGIN,RN 01/10/15 _0  BY V.WILKINS    Chloride 106 96 - 112 mEq/L   CO2 23 19 - 32 mmol/L   Glucose, Bld 79 70 - 99 mg/dL   BUN 56 (H) 6 - 23 mg/dL   Creatinine, Ser 9.46 (H) 0.50 - 1.35 mg/dL   Calcium 9.3 8.4 - 10.5 mg/dL   GFR calc non Af Amer 6 (L) >90 mL/min   GFR calc Af Amer 6 (L) >90 mL/min    Comment: (NOTE) The eGFR has been calculated using the CKD EPI equation. This calculation has not been validated in all clinical situations. eGFR's persistently <90 mL/min signify  possible Chronic Kidney Disease.    Anion gap 10 5 - 15  Glucose, capillary     Status: None   Collection Time: 01/10/15  7:53 PM  Result Value Ref Range   Glucose-Capillary 71 70 - 99 mg/dL  Basic metabolic panel     Status: Abnormal   Collection Time: 01/10/15 10:30 PM  Result Value Ref Range   Sodium 138 135 - 145 mmol/L    Comment: Please note change in reference range.   Potassium 6.1 (HH) 3.5 - 5.1 mmol/L    Comment: Please note change in reference range. CRITICAL RESULT CALLED TO, READ BACK BY AND VERIFIED WITH: NEAL,M RN 01/10/2015 2305 JORDANS REPEATED TO VERIFY    Chloride 104 96 - 112 mEq/L   CO2 23 19 - 32 mmol/L   Glucose, Bld 68 (L) 70 - 99 mg/dL   BUN 58 (H) 6 - 23 mg/dL   Creatinine, Ser 9.74 (H) 0.50 - 1.35 mg/dL   Calcium 8.9 8.4 - 10.5 mg/dL   GFR calc non Af Amer 5 (L) >90 mL/min    GFR calc Af Amer 6 (L) >90 mL/min    Comment: (NOTE) The eGFR has been calculated using the CKD EPI equation. This calculation has not been validated in all clinical situations. eGFR's persistently <90 mL/min signify possible Chronic Kidney Disease.    Anion gap 11 5 - 15  Glucose, capillary     Status: Abnormal   Collection Time: 01/10/15 11:56 PM  Result Value Ref Range   Glucose-Capillary 58 (L) 70 - 99 mg/dL   Comment 1 Arterial Sample   Glucose, capillary     Status: None   Collection Time: 01/11/15 12:26 AM  Result Value Ref Range   Glucose-Capillary 90 70 - 99 mg/dL   Comment 1 Arterial Sample   Basic metabolic panel     Status: Abnormal   Collection Time: 01/11/15  2:30 AM  Result Value Ref Range   Sodium 136 135 - 145 mmol/L    Comment: Please note change in reference range.   Potassium 5.7 (H) 3.5 - 5.1 mmol/L    Comment: Please note change in reference range.   Chloride 103 96 - 112 mEq/L   CO2 26 19 - 32 mmol/L   Glucose, Bld 75 70 - 99 mg/dL   BUN 58 (H) 6 - 23 mg/dL   Creatinine, Ser 9.68 (H) 0.50 - 1.35 mg/dL   Calcium 9.2 8.4 - 10.5 mg/dL   GFR calc non Af Amer 5 (L) >90 mL/min   GFR calc Af Amer 6 (L) >90 mL/min    Comment: (NOTE) The eGFR has been calculated using the CKD EPI equation. This calculation has not been validated in all clinical situations. eGFR's persistently <90 mL/min signify possible Chronic Kidney Disease.    Anion gap 7 5 - 15  CBC     Status: Abnormal   Collection Time: 01/11/15  2:30 AM  Result Value Ref Range   WBC 15.3 (H) 4.0 - 10.5 K/uL   RBC 4.39 4.22 - 5.81 MIL/uL   Hemoglobin 13.0 13.0 - 17.0 g/dL   HCT 38.4 (L) 39.0 - 52.0 %   MCV 87.5 78.0 - 100.0 fL   MCH 29.6 26.0 - 34.0 pg   MCHC 33.9 30.0 - 36.0 g/dL   RDW 14.5 11.5 - 15.5 %   Platelets 188 150 - 400 K/uL  I-STAT 3, arterial blood gas (G3+)     Status: Abnormal   Collection Time:  01/11/15  2:43 AM  Result Value Ref Range   pH, Arterial 7.239 (L) 7.350 - 7.450    pCO2 arterial 54.9 (H) 35.0 - 45.0 mmHg   pO2, Arterial 97.0 80.0 - 100.0 mmHg   Bicarbonate 23.5 20.0 - 24.0 mEq/L   TCO2 25 0 - 100 mmol/L   O2 Saturation 96.0 %   Acid-base deficit 5.0 (H) 0.0 - 2.0 mmol/L   Sample type ARTERIAL   Glucose, capillary     Status: Abnormal   Collection Time: 01/11/15  4:04 AM  Result Value Ref Range   Glucose-Capillary 67 (L) 70 - 99 mg/dL   Comment 1 Arterial Sample   Glucose, capillary     Status: Abnormal   Collection Time: 01/11/15  4:26 AM  Result Value Ref Range   Glucose-Capillary 102 (H) 70 - 99 mg/dL   Comment 1 Arterial Sample   Basic metabolic panel     Status: Abnormal   Collection Time: 01/11/15  7:00 AM  Result Value Ref Range   Sodium 137 135 - 145 mmol/L    Comment: Please note change in reference range.   Potassium 4.4 3.5 - 5.1 mmol/L    Comment: Please note change in reference range.   Chloride 100 96 - 112 mEq/L   CO2 30 19 - 32 mmol/L   Glucose, Bld 77 70 - 99 mg/dL   BUN 25 (H) 6 - 23 mg/dL    Comment: DELTA CHECK NOTED   Creatinine, Ser 5.41 (H) 0.50 - 1.35 mg/dL    Comment: DELTA CHECK NOTED   Calcium 8.5 8.4 - 10.5 mg/dL   GFR calc non Af Amer 11 (L) >90 mL/min   GFR calc Af Amer 13 (L) >90 mL/min    Comment: (NOTE) The eGFR has been calculated using the CKD EPI equation. This calculation has not been validated in all clinical situations. eGFR's persistently <90 mL/min signify possible Chronic Kidney Disease.    Anion gap 7 5 - 15  Glucose, capillary     Status: Abnormal   Collection Time: 01/11/15  7:26 AM  Result Value Ref Range   Glucose-Capillary 49 (L) 70 - 99 mg/dL  Glucose, capillary     Status: Abnormal   Collection Time: 01/11/15  9:16 AM  Result Value Ref Range   Glucose-Capillary 58 (L) 70 - 99 mg/dL  I-STAT 3, arterial blood gas (G3+)     Status: Abnormal   Collection Time: 01/11/15 10:11 AM  Result Value Ref Range   pH, Arterial 7.323 (L) 7.350 - 7.450   pCO2 arterial 54.9 (H) 35.0 - 45.0  mmHg   pO2, Arterial 77.0 (L) 80.0 - 100.0 mmHg   Bicarbonate 28.5 (H) 20.0 - 24.0 mEq/L   TCO2 30 0 - 100 mmol/L   O2 Saturation 94.0 %   Acid-Base Excess 1.0 0.0 - 2.0 mmol/L   Sample type ARTERIAL   Glucose, capillary     Status: Abnormal   Collection Time: 01/11/15 11:45 AM  Result Value Ref Range   Glucose-Capillary 33 (LL) 70 - 99 mg/dL   Comment 1 Capillary Sample   Glucose, capillary     Status: Abnormal   Collection Time: 01/11/15 12:39 PM  Result Value Ref Range   Glucose-Capillary 42 (LL) 70 - 99 mg/dL   Comment 1 Capillary Sample   Glucose, capillary     Status: Abnormal   Collection Time: 01/11/15  1:58 PM  Result Value Ref Range   Glucose-Capillary 68 (L) 70 - 99  mg/dL  Glucose, capillary     Status: None   Collection Time: 01/11/15  4:22 PM  Result Value Ref Range   Glucose-Capillary 75 70 - 99 mg/dL   Comment 1 Arterial Sample   Glucose, capillary     Status: None   Collection Time: 01/11/15  7:44 PM  Result Value Ref Range   Glucose-Capillary 72 70 - 99 mg/dL   Comment 1 Arterial Sample   Basic metabolic panel     Status: Abnormal   Collection Time: 01/11/15  9:00 PM  Result Value Ref Range   Sodium 137 135 - 145 mmol/L    Comment: Please note change in reference range.   Potassium 5.1 3.5 - 5.1 mmol/L    Comment: Please note change in reference range.   Chloride 98 96 - 112 mEq/L   CO2 30 19 - 32 mmol/L   Glucose, Bld 80 70 - 99 mg/dL   BUN 36 (H) 6 - 23 mg/dL   Creatinine, Ser 7.81 (H) 0.50 - 1.35 mg/dL   Calcium 9.1 8.4 - 10.5 mg/dL   GFR calc non Af Amer 7 (L) >90 mL/min   GFR calc Af Amer 8 (L) >90 mL/min    Comment: (NOTE) The eGFR has been calculated using the CKD EPI equation. This calculation has not been validated in all clinical situations. eGFR's persistently <90 mL/min signify possible Chronic Kidney Disease.    Anion gap 9 5 - 15    Dg Chest Port 1 View  01/11/2015   CLINICAL DATA:  Acute respiratory failure.  EXAM: PORTABLE CHEST  - 1 VIEW  COMPARISON:  01/10/2015  FINDINGS: There is an endotracheal tube with tip at the clavicular heads. A gastric suction tube reaches the diaphragm. Stable positioning of right IJ central line, tip at the SVC.  Unchanged cardiomegaly.  Unchanged upper mediastinal contours.  Central pulmonary venous congestion. Increased hazy lower lung opacities suggesting layering pleural fluid. There are probable superimposed lower lung opacities. No pneumothorax.  IMPRESSION: 1. Unchanged positioning of tubes and central line. 2. Increasing or layering bilateral pleural effusions. Pulmonary venous congestion and basilar lung opacification is unchanged.   Electronically Signed   By: Jorje Guild M.D.   On: 01/11/2015 02:55   Dg Chest Port 1 View  01/10/2015   CLINICAL DATA:  Subsequent encounter for altered mental status. History of hypertension.  EXAM: PORTABLE CHEST - 1 VIEW  COMPARISON:  01/09/2015  FINDINGS: Endotracheal tube difficult to visualize centrally. Nasogastric extends beyond the inferior aspect of the film. Right internal jugular line tip mid SVC.  Numerous leads and wires project over the chest. Cardiomegaly accentuated by AP portable technique. Developing left greater than right pleural effusions. No pneumothorax. Interstitial edema is moderate. Progressive.  IMPRESSION: Worsened aeration, with increased congestive heart failure, developing pleural effusions, and left greater than right bibasilar airspace disease.   Electronically Signed   By: Abigail Miyamoto M.D.   On: 01/10/2015 09:30   Dg Abd Portable 1v  01/11/2015   CLINICAL DATA:  Ileus  EXAM: PORTABLE ABDOMEN - 1 VIEW  COMPARISON:  None.  FINDINGS: There is nonspecific nonobstructive small bowel gas pattern. Mild colonic distension with gas and some stool probable mild colonic ileus. Some gas noted within stomach. NG tube in place. The left colon is not distended.  IMPRESSION: Nonobstructed small bowel gas pattern. Mild colonic distension with  gas and stool probable mild ileus. Some gas noted within stomach. NG tube in place PICC   Electronically  Signed   By: Lahoma Crocker M.D.   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 No seizures or myoclonus documented. Will recommend repeat CT brain and EEG. Patient was rewarmed less than 24 hours ago and is still receiving continuous sedation, creatinine >5. It is obvious that patient had sustained a severe anoxic brain injury but will defer prognostication until those tests become available and patient completes at least 48 hours after rewarming. Will follow up.  Dorian Pod, MD 01/11/2015, 10:17 PM

## 2015-01-11 NOTE — Progress Notes (Signed)
Updated patient's family (Ms. Arbie Cookeysabella Balint) who is currently living in [place]. I explained to her that patient's progress will depend on how well his mental status recovers over the next 24-48 hours. Questions answered over the phone. Ms. Bing ReeColes stated that patient has 3 cousins who live in [place] who might be visiting today or tomorrow. Patient also has another sister who also lives in [place]. Her name is Lilly CoveDawn Fedder and together with Arbie CookeyIsabella Wilford, they will be the ones to make major decisions in patient's care.

## 2015-01-11 NOTE — Progress Notes (Signed)
Family friend, Orvilla Fusommy, called and requested information. Friend did not have password so no information given over the phone. RN informed friend to call pt's cousin or sisters if she would like more information. Friend verbalized understanding

## 2015-01-12 ENCOUNTER — Inpatient Hospital Stay (HOSPITAL_COMMUNITY): Payer: Medicare Other

## 2015-01-12 LAB — GLUCOSE, CAPILLARY
GLUCOSE-CAPILLARY: 74 mg/dL (ref 70–99)
GLUCOSE-CAPILLARY: 85 mg/dL (ref 70–99)
Glucose-Capillary: 68 mg/dL — ABNORMAL LOW (ref 70–99)
Glucose-Capillary: 70 mg/dL (ref 70–99)
Glucose-Capillary: 72 mg/dL (ref 70–99)
Glucose-Capillary: 72 mg/dL (ref 70–99)
Glucose-Capillary: 78 mg/dL (ref 70–99)

## 2015-01-12 LAB — CBC
HCT: 36.2 % — ABNORMAL LOW (ref 39.0–52.0)
HCT: 37.2 % — ABNORMAL LOW (ref 39.0–52.0)
HEMOGLOBIN: 11.9 g/dL — AB (ref 13.0–17.0)
HEMOGLOBIN: 12.2 g/dL — AB (ref 13.0–17.0)
MCH: 29 pg (ref 26.0–34.0)
MCH: 29.7 pg (ref 26.0–34.0)
MCHC: 32.8 g/dL (ref 30.0–36.0)
MCHC: 32.9 g/dL (ref 30.0–36.0)
MCV: 88.3 fL (ref 78.0–100.0)
MCV: 90.5 fL (ref 78.0–100.0)
PLATELETS: 144 10*3/uL — AB (ref 150–400)
Platelets: 158 10*3/uL (ref 150–400)
RBC: 4.1 MIL/uL — AB (ref 4.22–5.81)
RBC: 4.11 MIL/uL — ABNORMAL LOW (ref 4.22–5.81)
RDW: 14.8 % (ref 11.5–15.5)
RDW: 14.8 % (ref 11.5–15.5)
WBC: 13.2 10*3/uL — ABNORMAL HIGH (ref 4.0–10.5)
WBC: 14.2 10*3/uL — AB (ref 4.0–10.5)

## 2015-01-12 LAB — BASIC METABOLIC PANEL
Anion gap: 11 (ref 5–15)
BUN: 39 mg/dL — ABNORMAL HIGH (ref 6–23)
CO2: 28 mmol/L (ref 19–32)
Calcium: 9.1 mg/dL (ref 8.4–10.5)
Chloride: 99 mEq/L (ref 96–112)
Creatinine, Ser: 8.12 mg/dL — ABNORMAL HIGH (ref 0.50–1.35)
GFR calc Af Amer: 8 mL/min — ABNORMAL LOW (ref >90)
GFR calc non Af Amer: 7 mL/min — ABNORMAL LOW (ref >90)
Glucose, Bld: 80 mg/dL (ref 70–99)
POTASSIUM: 5 mmol/L (ref 3.5–5.1)
Sodium: 138 mmol/L (ref 135–145)

## 2015-01-12 MED ORDER — DEXTROSE 50 % IV SOLN
INTRAVENOUS | Status: AC
  Administered 2015-01-12: 25 mL
  Filled 2015-01-12: qty 50

## 2015-01-12 MED ORDER — PANTOPRAZOLE SODIUM 40 MG PO PACK
40.0000 mg | PACK | Freq: Every day | ORAL | Status: DC
  Administered 2015-01-12 – 2015-01-20 (×9): 40 mg
  Filled 2015-01-12 (×10): qty 20

## 2015-01-12 NOTE — Procedures (Signed)
Pt transported from room 2H09 to CT then back to 2H09 without complications.  Placed back on previous vent setting.

## 2015-01-12 NOTE — Progress Notes (Signed)
CCM MD notified of rusty-red return from OG tube. Verbal orders given for a stat CBC to assess hgb. Will notify MD when results come back.

## 2015-01-12 NOTE — Progress Notes (Signed)
PULMONARY / CRITICAL CARE MEDICINE   Name: [name] [name] MRN: [id number] DOB: [birth year]    ADMISSION DATE:  01/09/2015   REFERRING MD :  ED  CHIEF COMPLAINT:  VT arrest  INITIAL PRESENTATION: Intubated post arrest  PATIENT SUMMARY:   54 yo ESRD , poorly compliant with HD MWF admitted after a cardia arrest on 1/16. He had 10 minutes of CPR along with one shock applied. Intubated in the field. Hypothermia initiated on arrival and taken to Cath lab urgently.  STUDIES:  1/16 CT head>> no acute abnormalities 1/18 Abdominal xray >> nonobstructive small bowel gas pattern. ? Mild colonic ileus   SIGNIFICANT EVENTS: 1/16 VT/V F arrest 1/16 Hypothermia >> 1/16 CVL right IJ >> 1/16 A-line rt radial >>  1/18 HD attempted; did not tolerate it very well due to hypotension, did have some volume removal 1/18 Consulted neurology for prognostication, planning EEG and head ct scan   SUBJECTIVE:  Started rewarming 24 hrs ago  Had some bloody returns from the OGT. Started on protonix drip.  VITAL SIGNS: Temp:  [97.3 F (36.3 C)-101.5 F (38.6 C)] 98.8 F (37.1 C) (01/19 0600) Pulse Rate:  [89-106] 98 (01/19 0735) Resp:  [0-28] 20 (01/19 0735) BP: (130-169)/(80-101) 169/80 mmHg (01/19 0735) SpO2:  [92 %-100 %] 97 % (01/19 0735) Arterial Line BP: (112-191)/(69-103) 152/71 mmHg (01/19 0700) FiO2 (%):  [50 %-60 %] 50 % (01/19 0735) Weight:  [286 lb 9.6 oz (130 kg)] 286 lb 9.6 oz (130 kg) (01/19 0600) HEMODYNAMICS: CVP:  [9 mmHg-14 mmHg] 13 mmHg VENTILATOR SETTINGS: Vent Mode:  [-] PRVC FiO2 (%):  [50 %-60 %] 50 % Set Rate:  [20 bmp] 20 bmp Vt Set:  [600 mL] 600 mL PEEP:  [5 cmH20] 5 cmH20 Pressure Support:  [8 cmH20] 8 cmH20 Plateau Pressure:  [15 cmH20-23 cmH20] 18 cmH20 INTAKE / OUTPUT:  Intake/Output Summary (Last 24 hours) at 01/12/15 0751 Last data filed at 01/12/15 0700  Gross per 24 hour  Intake   2463 ml  Output   1680 ml  Net    783 ml    PHYSICAL  EXAMINATION: General:  Obese AAM Neuro:  Obtunded, noticed twitching on the left side of his face. Small pupils with minimal reaction to light HEENT: Left ij av graft Cardiovascular:  HSR  Lungs:  Coarse rhonchi, with vent dysychrony at times. Abdomen:  Obese + b s. OGT returns have a tinge of red but no active bleeding Musculoskeletal:  Intact,  Skin:  Left arm av graft x 2, protuberant pulsatile. No signs of infecttion LABS:  CBC  Recent Labs Lab 01/11/15 0230 01/12/15 0001 01/12/15 0345  WBC 15.3* 14.2* 13.2*  HGB 13.0 11.9* 12.2*  HCT 38.4* 36.2* 37.2*  PLT 188 144* 158   Coag's  Recent Labs Lab 01/09/15 1315 01/09/15 2109 01/09/15 2309  APTT 32 34 33  INR 1.19 1.24 1.23   BMET  Recent Labs Lab 01/11/15 0700 01/11/15 2100 01/12/15 0345  NA 137 137 138  K 4.4 5.1 5.0  CL 100 98 99  CO2 BUN 25* 36* 39*  CREATININE 5.41* 7.81* 8.12*  GLUCOSE 77 80 80   Electrolytes  Recent Labs Lab 01/09/15 1359  01/10/15 0359  01/11/15 0700 01/11/15 2100 01/12/15 0345  CALCIUM 8.8  < >  --   < > 8.5 9.1 9.1  MG 2.3  --  2.3  --   --   --   --  PHOS 6.4*  --  5.1*  --   --   --   --   < > = values in this interval not displayed. Sepsis Markers No results for input(s): LATICACIDVEN, PROCALCITON, O2SATVEN in the last 168 hours. ABG  Recent Labs Lab 01/10/15 0444 01/11/15 0243 01/11/15 1011  PHART 7.329* 7.239* 7.323*  PCO2ART 42.7 54.9* 54.9*  PO2ART 78.7* 97.0 77.0*   Liver Enzymes No results for input(s): AST, ALT, ALKPHOS, BILITOT, ALBUMIN in the last 168 hours. Cardiac Enzymes  Recent Labs Lab 01/09/15 1359 01/10/15 0159 01/10/15 0637  TROPONINI 0.80* 0.78* 0.69*   Glucose  Recent Labs Lab 01/11/15 1358 01/11/15 1622 01/11/15 1944 01/12/15 0001 01/12/15 0030 01/12/15 0349  GLUCAP 68* 75 72 68* 85 70    Imaging Dg Chest Port 1 View  01/11/2015   CLINICAL DATA:  Acute respiratory failure.  EXAM: PORTABLE CHEST - 1 VIEW   COMPARISON:  01/10/2015  FINDINGS: There is an endotracheal tube with tip at the clavicular heads. A gastric suction tube reaches the diaphragm. Stable positioning of right IJ central line, tip at the SVC.  Unchanged cardiomegaly.  Unchanged upper mediastinal contours.  Central pulmonary venous congestion. Increased hazy lower lung opacities suggesting layering pleural fluid. There are probable superimposed lower lung opacities. No pneumothorax.  IMPRESSION: 1. Unchanged positioning of tubes and central line. 2. Increasing or layering bilateral pleural effusions. Pulmonary venous congestion and basilar lung opacification is unchanged.   Electronically Signed   By: Tiburcio Pea M.D.   On: 01/11/2015 02:55   Dg Abd Portable 1v  01/11/2015   CLINICAL DATA:  Ileus  EXAM: PORTABLE ABDOMEN - 1 VIEW  COMPARISON:  None.  FINDINGS: There is nonspecific nonobstructive small bowel gas pattern. Mild colonic distension with gas and some stool probable mild colonic ileus. Some gas noted within stomach. NG tube in place. The left colon is not distended.  IMPRESSION: Nonobstructed small bowel gas pattern. Mild colonic distension with gas and stool probable mild ileus. Some gas noted within stomach. NG tube in place PICC   Electronically Signed   By: Natasha Mead M.D.   On: 01/11/2015 10:38   Portable cxr 1/19 >> tubes in good position. Persistent cardiomegaly with bilateral infiltrates. Small bil pleural effusion.  ASSESSMENT / PLAN:  PULMONARY OETT 1/16 per ems>> Increased B pleural effusions ?due to increased volume  A: VDRF post VT arrest Hypothermia protocol P:   Vent bundle On PRVC SBT as able to tolerate Consider thora if we believe this will facilitate weaning  CARDIOVASCULAR A:  Post arrest VT/VF shocked x 3 HTN > improving on Cardene gtt Hx of CAD inferior Q waves and ST elevation inferiorly on old ECGs Mild trop elevation 0.8 >0.78>0.69 P:  On and off Cardene gtt, BP improved 1/18 Off  amiodarone  Hypothermia protocol, normothermia phase until 1/20 am Card cath deferred pending neurologic improvement Trop elevation stable, stress NSTEMI Cards following   RENAL A:   ESRD HD M W F Hypokalemia evolved to hyperkalemia.  P:   Appreciate Renal consult HD complicated due to low BP  2L removed. Monitor renal function   GASTROINTESTINAL A:   GI protection 1/18 noticed increased GI secretions, ? SBO / ileus. At some risk for bowel ischemia 1/18 Abdominal xray >> nonobstructive small bowel gas pattern. ? Mild colonic ileus ? Red tinged secretions, minimal P:   Cbc stable hgb, no evidence of significant bleeding in GI tract. Will monitor clinically D/c protonix gtt Start  PIP for SUP  HEMATOLOGIC A:   Leukocytosis of 15.5 >>13.2 P:  Follow trend  INFECTIOUS A:   No infectious process.  Leukocytosis is likely WBC demargination Spiked a temp of 101.5 yesterday P:   No abx for now  Follow clinically and Cx if fever returns.  ENDOCRINE A:   Hypoglycemia P:   D10 started am 1/18 CBG q4h.   NEUROLOGIC A:   Significant anoxia brain injury Post arrest non responsive. Head ct scan 1/16 nl  P:   RASS goal: -1 to 0 1/18 Consulted neurology to assist with prognosis.  Planning EEG this am and repeat head ct scan   Case discussed with Dr Craige CottaSood.  Signed:  Dow Adolphichard Kazibwe, MD PGY-3 Internal Medicine Teaching Service Pager: [phone] 01/12/2015, 7:51 AM     Reviewed above, examined.  No significant improvement in mental status so far.  He had ?blood in NG tube this AM > Hb stable.  Continue protonix and f/u Hb.  HD per renal >> okay to continue with intermittent HD.  Continue full vent support.  Has scattered rhonchi.  Defer cardiac assessment until mental status improved >> if no improvement, then need to speak with family about goals of care.  F/u EEG.  Will need further neuro imaging when able.  CC time by me independent of resident time 35  minutes.  Coralyn HellingVineet Hari Casaus, MD [facility] Pulmonary/Critical [facility] 01/12/2015, 11:05 AM Pager:  [phone] After 3pm call: [phone]

## 2015-01-12 NOTE — Procedures (Cosign Needed)
ELECTROENCEPHALOGRAM REPORT   Patient: [name] [name]       Room #: 2H-09 EEG No. ID:  Age: 54 y.o.        Sex: male Referring Physician: Dr Molli KnockYacoub Report Date:  01/12/2015        Interpreting Physician: Elspeth ChoSUMNER, [name], [name]  History: [name] Nevinsugene N Sanzone is an 54 y.o. male admitted after a cardia arrest on 1/16. He had 10 minutes of CPR along with one shock applied. Intubated in the field. Hypothermia initiated on arrival and taken to Cath lab urgently.  Medications:  Scheduled: . antiseptic oral rinse  7 mL Mouth Rinse QID  . aspirin  300 mg Rectal Daily  . chlorhexidine  15 mL Mouth Rinse BID  . doxercalciferol  14 mcg Intravenous Q M,W,F-HD  . insulin aspart  0-15 Units Subcutaneous 6 times per day  . pantoprazole sodium  40 mg Per Tube Daily    Conditions of Recording:  This is a 16 channel EEG carried out with the patient in the unresponsive state.  Description:  Study is technically limited due to diffuse muscle artifact predominantly in the fronto-temporal region. The background consists mainly of poorly formed medium to high amplitude theta and delta activity. No dominant posterior rhythm is noted. No focal slowing is noted. There are frequent posterior sharp waves with a left sided predominance. No clear evolution is noted from these waves. The study is technically limited due to artifact but no clear EEG correlation is noted during episodes of myoclonic jerks.   No clear deliniation between awake and sleep state is noted. Hyperventilation and photic stimulation was not performed.  IMPRESSION: Abnormal EEG due to generalized slowing indicating a moderate to severe cerebral dysfunction (encephalopathy). Intermittent sharp wave activity is noted in the bilateral posterior region with no clear evolution. Study is technically limited due to diffuse muscle artifact but no clear EEG correlate is noted during periods of myoclonic jerks.    Elspeth Choeter Skylor Hughson,  DO [facility] [phone]  If 7pm- 7am, please page neurology on call as listed in AMION. 01/12/2015, 11:36 AM

## 2015-01-12 NOTE — Progress Notes (Signed)
EEG completed; results pending.    

## 2015-01-12 NOTE — Progress Notes (Signed)
Hypoglycemic Event  CBG: 68  Treatment: D50 IV 25 mL  Symptoms: None  Follow-up CBG: Time:0030 CBG Result:85  Possible Reasons for Event: Inadequate meal intake, pt NPO, receiving IV D10@ 5975ml/hr     Jelitza Manninen M  Remember to initiate Hypoglycemia Order Set & complete

## 2015-01-12 NOTE — Progress Notes (Signed)
INITIAL NUTRITION ASSESSMENT  DOCUMENTATION CODES Per approved criteria  -Morbid Obesity   INTERVENTION: Recommend Vital HP @ 20 ml/hr via OGT and increase by 10 ml every 4 hours to goal rate of 80 ml/hr.   Tube feeding regimen provides 1920 kcal (70% of needs), 168 grams of protein (100% of needs), and 1613 ml of H2O.   NUTRITION DIAGNOSIS: Inadequate oral intake related to inability to eat as evidenced by NPO.   Goal: Enteral nutrition to provide 60-70% of estimated calorie needs (22-25 kcals/kg ideal body weight) and 100% of estimated protein needs, based on ASPEN guidelines for hypocaloric, high protein feeding in critically ill obese individuals  Monitor:  Weight trend, initiation and toleration of TF, Vent status/settings, labs  Reason for Assessment: Vent  54 y.o. male  Admitting Dx: <principal problem not specified>  ASSESSMENT: 53 yo ESRD , poorly compliant with HD MWF who noted to be slumped over at home. EMS activated and 10 minutes of CPR along with one shock applied. Intubated in the field and transported to [facility] ED and his deemed a hypothermia candidate and taken to Cath lab urgently.  Pt has been re-warmed. Nutrition-focused Physical Exam WNL  History of ESRD. Had HD last night until BP dropped. May transition to CVVHD if needs aggressive volume removal per nephrology note.   Patient is currently intubated on ventilator support MV: 12.4 L/min Temp (24hrs), Avg:98.8 F (37.1 C), Min:97.3 F (36.3 C), Max:101.5 F (38.6 C)  Propofol: none  Height: Ht Readings from Last 1 Encounters:  01/09/15  (1.88 m)    Weight: Wt Readings from Last 1 Encounters:  01/12/15 286 lb 9.6 oz (130 kg)    Ideal Body Weight: 82.2 kg  % Ideal Body Weight: 158%  Wt Readings from Last 10 Encounters:  01/12/15 286 lb 9.6 oz (130 kg)  09/08/14 292 lb (132.45 kg)  03/17/14 312 lb 8 oz (141.749 kg)  12/03/13 310 lb 3 oz (140.7 kg)  10/15/13 324 lb 15.3 oz (147.4 kg)   09/12/13 309 lb 1.4 oz (140.2 kg)  05/30/13 325 lb 6.4 oz (147.6 kg)  03/04/13 333 lb (151.048 kg)  02/04/13 322 lb 12.8 oz (146.421 kg)  12/03/12 313 lb (141.976 kg)   BMI:  Body mass index is 36.78 kg/(m^2).  Estimated Nutritional Needs: Kcal: 2740 Protein: 160-170 g Fluid: Per MD  Skin: Intact  Diet Order:    EDUCATION NEEDS: -Education not appropriate at this time   Intake/Output Summary (Last 24 hours) at 01/12/15 1040 Last data filed at 01/12/15 1000  Gross per 24 hour  Intake 2695.25 ml  Output    980 ml  Net 1715.25 ml    Last BM: prior to admission   Labs:   Recent Labs Lab 01/09/15 1359  01/10/15 0359  01/11/15 0700 01/11/15 2100 01/12/15 0345  NA 136  < >  --   < > 137 137 138  K 3.4*  < >  --   < > 4.4 5.1 5.0  CL 99  < >  --   < > 100 98 99  CO2 21  < >  --   < > BUN 45*  < >  --   < > 25* 36* 39*  CREATININE 8.51*  < >  --   < > 5.41* 7.81* 8.12*  CALCIUM 8.8  < >  --   < > 8.5 9.1 9.1  MG 2.3  --  2.3  --   --   --   --  PHOS 6.4*  --  5.1*  --   --   --   --   GLUCOSE 245*  < >  --   < > 77 80 80  < > = values in this interval not displayed.  CBG (last 3)   Recent Labs  01/12/15 0030 01/12/15 0349 01/12/15 0854  GLUCAP 85 70 72    Scheduled Meds: . antiseptic oral rinse  7 mL Mouth Rinse QID  . aspirin  300 mg Rectal Daily  . chlorhexidine  15 mL Mouth Rinse BID  . doxercalciferol  14 mcg Intravenous Q M,W,F-HD  . insulin aspart  0-15 Units Subcutaneous 6 times per day  . pantoprazole sodium  40 mg Per Tube Daily    Continuous Infusions: . sodium chloride 10 mL/hr at 01/11/15 1600  . dextrose 75 mL/hr at 01/11/15 1711  . fentaNYL infusion INTRAVENOUS 200 mcg/hr (01/12/15 0900)  . midazolam (VERSED) infusion 2 mg/hr (01/12/15 0900)  . niCARDipine Stopped (01/10/15 1430)  . norepinephrine (LEVOPHED) Adult infusion Stopped (01/09/15 2032)    Past Medical History  Diagnosis Date  . Hypertension   . End stage  renal disease     a. MWF dialysis  . Arthritis   . Polysubstance abuse     a. tobacco and cocaine  . Chest pain     a. negative MV in 2004  . Abnormal ECG   . LVH (left ventricular hypertrophy)     a. 01/2013 Echo: EF 60-65%, sev concentric LVH, nl wall motion, sev dil LA.  Marland Kitchen. Hyperkalemia 10/15/2013    Past Surgical History  Procedure Laterality Date  . Av fistula placement      Left x 2  . Cholecystectomy    . Total knee arthroplasty  09/20/2012    Procedure: TOTAL KNEE ARTHROPLASTY;  Surgeon: Shelda PalMatthew D Olin, MD;  [facility];  Service: Orthopedics;  Laterality: Right;  right total knee arthroplasty  . Joint replacement Right 09/20/12  . Left heart catheterization with coronary angiogram N/A 05/29/2013    Procedure: LEFT HEART CATHETERIZATION WITH CORONARY ANGIOGRAM;  Surgeon: Tonny BollmanMichael Cooper, MD;  [facility];  Service: Cardiovascular;  Laterality: N/A;  . Left heart cath Bilateral 01/09/2015    Procedure: LEFT HEART CATH;  Surgeon: Corky CraftsJayadeep S Varanasi, MD;  [facility];  Service: Cardiovascular;  Laterality: Bilateral;    Emmaline KluverHaley Maisen Schmit MS, RD, LDN

## 2015-01-12 NOTE — Progress Notes (Signed)
Subjective: Patient remains intubated and unresponsive.  On conversation with the nurse when sedation is weaned the patient is not synchronous with the ventilator therefore he remains on sedation.     Objective: Current vital signs: BP 169/80 mmHg  Pulse 98  Temp(Src) 98.8 F (37.1 C) (Core (Comment))  Resp 20  Ht  (1.88 m)  Wt 130 kg (286 lb 9.6 oz)  BMI 36.78 kg/m2  SpO2 97% Vital signs in last 24 hours: Temp:  [97.3 F (36.3 C)-101.5 F (38.6 C)] 98.8 F (37.1 C) (01/19 0600) Pulse Rate:  [89-102] 98 (01/19 0735) Resp:  [0-28] 20 (01/19 0735) BP: (130-169)/(80-101) 169/80 mmHg (01/19 0735) SpO2:  [92 %-100 %] 97 % (01/19 0735) Arterial Line BP: (112-191)/(69-103) 152/71 mmHg (01/19 0700) FiO2 (%):  [50 %] 50 % (01/19 0735) Weight:  [130 kg (286 lb 9.6 oz)] 130 kg (286 lb 9.6 oz) (01/19 0600)  Intake/Output from previous day: 01/18 0701 - 01/19 0700 In: 2473 [I.V.:2473] Out: 3806 [Urine:30; Emesis/NG output:1650] Intake/Output this shift:   Nutritional status:    Continuous rhythmic contraction of the left temporal muscle Neurologic Exam: Mental Status: Patient does not respond to verbal stimuli.  Does not respond to deep sternal rub.  Does not follow commands.  No verbalizations noted.  Cranial Nerves: II: patient does not respond confrontation bilaterally, pupils right 3 mm, left 3 mm,and reactive bilaterally III,IV,VI: doll's response present bilaterally.  V,VII: corneal reflex present bilaterally  VIII: patient does not respond to verbal stimuli IX,X: gag reflex reduced, XI: trapezius strength unable to test bilaterally XII: tongue strength unable to test Motor: Extremities flaccid throughout.  No spontaneous movement noted.  No purposeful movements noted. Sensory: Does not respond to noxious stimuli in any extremity. Deep Tendon Reflexes:  1+ throughout. Plantars: mute bilaterally Cerebellar: Unable to perform    Lab Results: Basic Metabolic  Panel:  Recent Labs Lab 01/09/15 1359  01/10/15 0359  01/10/15 2230 01/11/15 0230 01/11/15 0700 01/11/15 2100 01/12/15 0345  NA 136  < >  --   < > 138 136 137 137 138  K 3.4*  < >  --   < > 6.1* 5.7* 4.4 5.1 5.0  CL 99  < >  --   < > 104 103 100 98 99  CO2 21  < >  --   < > GLUCOSE 245*  < >  --   < > 68* 75 77 80 80  BUN 45*  < >  --   < > 58* 58* 25* 36* 39*  CREATININE 8.51*  < >  --   < > 9.74* 9.68* 5.41* 7.81* 8.12*  CALCIUM 8.8  < >  --   < > 8.9 9.2 8.5 9.1 9.1  MG 2.3  --  2.3  --   --   --   --   --   --   PHOS 6.4*  --  5.1*  --   --   --   --   --   --   < > = values in this interval not displayed.  Liver Function Tests: No results for input(s): AST, ALT, ALKPHOS, BILITOT, PROT, ALBUMIN in the last 168 hours. No results for input(s): LIPASE, AMYLASE in the last 168 hours. No results for input(s): AMMONIA in the last 168 hours.  CBC:  Recent Labs Lab 01/09/15 1315  01/09/15 2049 01/10/15 0359 01/11/15 0230 01/12/15 0001 01/12/15 0345  WBC  9.1  --   --  15.5* 15.3* 14.2* 13.2*  NEUTROABS 5.4  --   --   --   --   --   --   HGB 11.9*  < > 14.6 12.5* 13.0 11.9* 12.2*  HCT 36.9*  < > 43.0 37.6* 38.4* 36.2* 37.2*  MCV 90.2  --   --  87.6 87.5 88.3 90.5  PLT 175  --   --  163 188 144* 158  < > = values in this interval not displayed.  Cardiac Enzymes:  Recent Labs Lab 01/09/15 1359 01/10/15 0159 01/10/15 0637  TROPONINI 0.80* 0.78* 0.69*    Lipid Panel: No results for input(s): CHOL, TRIG, HDL, CHOLHDL, VLDL, LDLCALC in the last 168 hours.  CBG:  Recent Labs Lab 01/11/15 1622 01/11/15 1944 01/12/15 0001 01/12/15 0030 01/12/15 0349  GLUCAP 75 72 68* 85 70    Microbiology: Results for orders placed or performed during the hospital encounter of 01/09/15  MRSA PCR Screening     Status: None   Collection Time: 01/09/15  5:31 PM  Result Value Ref Range Status   MRSA by PCR NEGATIVE NEGATIVE Final    Comment:        The  GeneXpert MRSA Assay (FDA approved for NASAL specimens only), is one component of a comprehensive MRSA colonization surveillance program. It is not intended to diagnose MRSA infection nor to guide or monitor treatment for MRSA infections.     Coagulation Studies:  Recent Labs  01/09/15 1315 01/09/15 2109 01/09/15 2309  LABPROT 15.2 15.7* 15.6*  INR 1.19 1.24 1.23    Imaging: Dg Chest Port 1 View  01/12/2015   CLINICAL DATA:  Cardiac arrest.  EXAM: PORTABLE CHEST - 1 VIEW  COMPARISON:  01/11/2015 .  FINDINGS: Lines and tubes in stable position. Cardiomegaly. Bilateral pulmonary alveolar infiltrates again noted. Small bilateral pleural effusions. No pneumothorax.  IMPRESSION: 1. Lines and tubes in stable position. 2. Persistent severe cardiomegaly. Bilateral pulmonary infiltrates most likely secondary to congestive heart failure and pulmonary edema. Small bilateral pleural effusions are present . No significant interim change.   Electronically Signed   By: Maisie Fus  Register   On: 01/12/2015 07:20   Dg Chest Port 1 View  01/11/2015   CLINICAL DATA:  Acute respiratory failure.  EXAM: PORTABLE CHEST - 1 VIEW  COMPARISON:  01/10/2015  FINDINGS: There is an endotracheal tube with tip at the clavicular heads. A gastric suction tube reaches the diaphragm. Stable positioning of right IJ central line, tip at the SVC.  Unchanged cardiomegaly.  Unchanged upper mediastinal contours.  Central pulmonary venous congestion. Increased hazy lower lung opacities suggesting layering pleural fluid. There are probable superimposed lower lung opacities. No pneumothorax.  IMPRESSION: 1. Unchanged positioning of tubes and central line. 2. Increasing or layering bilateral pleural effusions. Pulmonary venous congestion and basilar lung opacification is unchanged.   Electronically Signed   By: Tiburcio Pea M.D.   On: 01/11/2015 02:55   Dg Abd Portable 1v  01/11/2015   CLINICAL DATA:  Ileus  EXAM: PORTABLE ABDOMEN  - 1 VIEW  COMPARISON:  None.  FINDINGS: There is nonspecific nonobstructive small bowel gas pattern. Mild colonic distension with gas and some stool probable mild colonic ileus. Some gas noted within stomach. NG tube in place. The left colon is not distended.  IMPRESSION: Nonobstructed small bowel gas pattern. Mild colonic distension with gas and stool probable mild ileus. Some gas noted within stomach. NG tube in place PICC  Electronically Signed   By: Natasha MeadLiviu  Pop M.D.   On: 01/11/2015 10:38    Medications:  I have reviewed the patient's current medications. Scheduled: . antiseptic oral rinse  7 mL Mouth Rinse QID  . aspirin  300 mg Rectal Daily  . chlorhexidine  15 mL Mouth Rinse BID  . doxercalciferol  14 mcg Intravenous Q M,W,F-HD  . insulin aspart  0-15 Units Subcutaneous 6 times per day  . pantoprazole sodium  40 mg Per Tube Daily    Assessment/Plan: 54 year old male s/p arrest and hypothermia.  Has not returned to baseline mental status.  Unable to fully evaluate neurologically at this time because he remains on sedation.  WBC count remains elevated and renal function is worsening.    Recommendations: 1.  EEG to be performed today.  Attempt will be made to perform off sedation. 2.  MRI pending.     LOS: 3 days   Thana FarrLeslie Sahian Kerney, MD [facility] [phone] 01/12/2015  9:07 AM

## 2015-01-12 NOTE — Progress Notes (Signed)
RT inserted bite block due to patient biting down on ETT. RN aware.

## 2015-01-12 NOTE — Progress Notes (Signed)
I called patient's sister, Ms. Dawn Bendoles about ct scan and EEG. Questions answered. Will be waiting for improvement over the next 24-48hrs.

## 2015-01-12 NOTE — Progress Notes (Signed)
Patient ID: [name] [name], male   DOB: 10-10-1961, 54 y.o.   MRN: [id number]   SUBJECTIVE: Patient has been rewarmed.  Little response but withdraws from painful stimuli.   Scheduled Meds: . antiseptic oral rinse  7 mL Mouth Rinse QID  . aspirin  300 mg Rectal Daily  . chlorhexidine  15 mL Mouth Rinse BID  . doxercalciferol  14 mcg Intravenous Q M,W,F-HD  . insulin aspart  0-15 Units Subcutaneous 6 times per day  . pantoprazole sodium  40 mg Per Tube Daily   Continuous Infusions: . sodium chloride 10 mL/hr at 01/11/15 1600  . dextrose 75 mL/hr at 01/11/15 1711  . fentaNYL infusion INTRAVENOUS Stopped (01/12/15 0815)  . midazolam (VERSED) infusion Stopped (01/12/15 0815)  . niCARDipine Stopped (01/10/15 1430)  . norepinephrine (LEVOPHED) Adult infusion Stopped (01/09/15 2032)   PRN Meds:.sodium chloride, sodium chloride, calcium chloride, feeding supplement (NEPRO CARB STEADY), fentaNYL, heparin, heparin, hydrALAZINE, lidocaine (PF), lidocaine-prilocaine, midazolam, pentafluoroprop-tetrafluoroeth    Filed Vitals:   01/12/15 0515 01/12/15 0600 01/12/15 0700 01/12/15 0735  BP:    169/80  Pulse: 98 93  98  Temp:  98.8 F (37.1 C)    TempSrc:  Core (Comment)    Resp: Height:      Weight:  286 lb 9.6 oz (130 kg)    SpO2: 100% 98%  97%    Intake/Output Summary (Last 24 hours) at 01/12/15 0931 Last data filed at 01/12/15 0700  Gross per 24 hour  Intake   2394 ml  Output    980 ml  Net   1414 ml    LABS: Basic Metabolic Panel:  Recent Labs  36/64/40 1359  01/10/15 0359  01/11/15 2100 01/12/15 0345  NA 136  < >  --   < > 137 138  K 3.4*  < >  --   < > 5.1 5.0  CL 99  < >  --   < > 98 99  CO2 21  < >  --   < > 30 28  GLUCOSE 245*  < >  --   < > 80 80  BUN 45*  < >  --   < > 36* 39*  CREATININE 8.51*  < >  --   < > 7.81* 8.12*  CALCIUM 8.8  < >  --   < > 9.1 9.1  MG 2.3  --  2.3  --   --   --   PHOS 6.4*  --  5.1*  --   --   --   < > = values in this  interval not displayed. Liver Function Tests: No results for input(s): AST, ALT, ALKPHOS, BILITOT, PROT, ALBUMIN in the last 72 hours. No results for input(s): LIPASE, AMYLASE in the last 72 hours. CBC:  Recent Labs  01/09/15 1315  01/12/15 0001 01/12/15 0345  WBC 9.1  < > 14.2* 13.2*  NEUTROABS 5.4  --   --   --   HGB 11.9*  < > 11.9* 12.2*  HCT 36.9*  < > 36.2* 37.2*  MCV 90.2  < > 88.3 90.5  PLT 175  < > 144* 158  < > = values in this interval not displayed. Cardiac Enzymes:  Recent Labs  01/09/15 1359 01/10/15 0159 01/10/15 0637  TROPONINI 0.80* 0.78* 0.69*   BNP: Invalid input(s): POCBNP D-Dimer: No results for input(s): DDIMER in the last 72 hours. Hemoglobin A1C: No results for input(s): HGBA1C  in the last 72 hours. Fasting Lipid Panel: No results for input(s): CHOL, HDL, LDLCALC, TRIG, CHOLHDL, LDLDIRECT in the last 72 hours. Thyroid Function Tests: No results for input(s): TSH, T4TOTAL, T3FREE, THYROIDAB in the last 72 hours.  Invalid input(s): FREET3 Anemia Panel: No results for input(s): VITAMINB12, FOLATE, FERRITIN, TIBC, IRON, RETICCTPCT in the last 72 hours.  RADIOLOGY: Ct Head Wo Contrast  01/09/2015   CLINICAL DATA:  Post code.  Question anoxic brain injury.  EXAM: CT HEAD WITHOUT CONTRAST  TECHNIQUE: Contiguous axial images were obtained from the base of the skull through the vertex without intravenous contrast.  COMPARISON:  None.  FINDINGS: No acute intracranial abnormality. Specifically, no hemorrhage, hydrocephalus, mass lesion, acute infarction, or significant intracranial injury. No acute calvarial abnormality. No current CT evidence or not brain injury.  Extensive soft tissue thickening within the nasal airway and ethmoid air cells as well as maxillary sinuses and sphenoid sinus. Air-fluid levels in the sphenoid sinuses. Mastoid air cells are clear.  IMPRESSION: No acute intracranial abnormality.   Electronically Signed   By: [name] Nose M.D.   On:  01/09/2015 14:40   Dg Chest Port 1 View  01/12/2015   CLINICAL DATA:  Cardiac arrest.  EXAM: PORTABLE CHEST - 1 VIEW  COMPARISON:  01/11/2015 .  FINDINGS: Lines and tubes in stable position. Cardiomegaly. Bilateral pulmonary alveolar infiltrates again noted. Small bilateral pleural effusions. No pneumothorax.  IMPRESSION: 1. Lines and tubes in stable position. 2. Persistent severe cardiomegaly. Bilateral pulmonary infiltrates most likely secondary to congestive heart failure and pulmonary edema. Small bilateral pleural effusions are present . No significant interim change.   Electronically Signed   By: [name] Fus  [name]   On: 01/12/2015 07:20   Dg Chest Port 1 View  01/11/2015   CLINICAL DATA:  Acute respiratory failure.  EXAM: PORTABLE CHEST - 1 VIEW  COMPARISON:  01/10/2015  FINDINGS: There is an endotracheal tube with tip at the clavicular heads. A gastric suction tube reaches the diaphragm. Stable positioning of right IJ central line, tip at the SVC.  Unchanged cardiomegaly.  Unchanged upper mediastinal contours.  Central pulmonary venous congestion. Increased hazy lower lung opacities suggesting layering pleural fluid. There are probable superimposed lower lung opacities. No pneumothorax.  IMPRESSION: 1. Unchanged positioning of tubes and central line. 2. Increasing or layering bilateral pleural effusions. Pulmonary venous congestion and basilar lung opacification is unchanged.   Electronically Signed   By: [name] Pea M.D.   On: 01/11/2015 02:55   Dg Chest Port 1 View  01/10/2015   CLINICAL DATA:  Subsequent encounter for altered mental status. History of hypertension.  EXAM: PORTABLE CHEST - 1 VIEW  COMPARISON:  01/09/2015  FINDINGS: Endotracheal tube difficult to visualize centrally. Nasogastric extends beyond the inferior aspect of the film. Right internal jugular line tip mid SVC.  Numerous leads and wires project over the chest. Cardiomegaly accentuated by AP portable technique. Developing  left greater than right pleural effusions. No pneumothorax. Interstitial edema is moderate. Progressive.  IMPRESSION: Worsened aeration, with increased congestive heart failure, developing pleural effusions, and left greater than right bibasilar airspace disease.   Electronically Signed   By: [name] Greaves M.D.   On: 01/10/2015 09:30   Dg Chest Port 1 View  01/09/2015   CLINICAL DATA:  Central line placement  EXAM: PORTABLE CHEST - 1 VIEW  COMPARISON:  01/09/2015  FINDINGS: Right internal jugular central line is in place with the tip in the SVC. No pneumothorax. Endotracheal tube and  NG tube remain in place, unchanged. Cardiomegaly. Vascular congestion. No real change since prior study.  IMPRESSION: Right central line tip in the SVC.  No pneumothorax.   Electronically Signed   By: [name] NoseKevin  Dover M.D.   On: 01/09/2015 15:20   Dg Chest Portable 1 View  01/09/2015   CLINICAL DATA:  Cardiac arrest.  Intubated.  EXAM: PORTABLE CHEST - 1 VIEW  COMPARISON:  12/02/2013.  FINDINGS: Endotracheal tube in satisfactory position. Nasogastric tube extending into the stomach. Stable enlarged cardiac silhouette. Support apparatus obscuring portions of the chest with no gross lung infiltrates. There is a linear lucency overlying the mediastinum on the left. No definite pneumothorax seen.  IMPRESSION: Limited examination with a linear artifact or pneumomediastinum noted on the left.   Electronically Signed   By: [name] PaymentSteve  Reid M.D.   On: 01/09/2015 14:27   Dg Abd Portable 1v  01/11/2015   CLINICAL DATA:  Ileus  EXAM: PORTABLE ABDOMEN - 1 VIEW  COMPARISON:  None.  FINDINGS: There is nonspecific nonobstructive small bowel gas pattern. Mild colonic distension with gas and some stool probable mild colonic ileus. Some gas noted within stomach. NG tube in place. The left colon is not distended.  IMPRESSION: Nonobstructed small bowel gas pattern. Mild colonic distension with gas and stool probable mild ileus. Some gas noted within  stomach. NG tube in place PICC   Electronically Signed   By: [name] MeadLiviu  Pop M.D.   On: 01/11/2015 10:38    PHYSICAL EXAM General: Sedated on vent.  Neck: No JVD, no thyromegaly or thyroid nodule.  Lungs: Coarse breath sounds bilaterally.  CV: Nondisplaced PMI.  Heart regular S1/S2, no S3/S4, no murmur.  No peripheral edema.   Abdomen: Soft, nontender, no hepatosplenomegaly, no distention.  Neurologic: Withdraws from painful stimuli.  Extremities: No clubbing or cyanosis.   TELEMETRY: Reviewed telemetry pt in NSR  ASSESSMENT AND PLAN: 54 yo with history of ESRD and CAD at prior cath was admitted after cardiac arrest.  1. Cardiac arrest: VT/VF with defibrillation x 3.  Prolonged down-time.  He underwent hypothermia and is now rewarmed.  EF 55-60% on echo.  No significant change on ECG from prior.  No purposeful movement so far.  Possible ischemic event though troponin only rose to 0.8.  - If patient shows neurologic recovery, will need cardiac cath.  - Continue ASA 81.  2. Anoxic encephalopathy: See above, continue to monitor for recovery.  He is now off sedation and withdraws from painful stimuli.  Neurology following.  3. ESRD: BP dropping with HD, off BP meds.  4. HTN: Will give prn hydralazine.    [name] [name] 01/12/2015 9:31 AM

## 2015-01-12 NOTE — Progress Notes (Signed)
Patient ID: [name] [name], male   DOB: 1961/12/01, 54 y.o.   MRN: [id number]  [facility] Progress Note    Subjective:   Intubated, unresponsive   Objective:   BP 169/80 mmHg  Pulse 98  Temp(Src) 98.8 F (37.1 C) (Core (Comment))  Resp 20  Ht 6\' 2"  (1.88 m)  Wt 130 kg (286 lb 9.6 oz)  BMI 36.78 kg/m2  SpO2 97%  Intake/Output: I/O last 3 completed shifts: In: 3069.3 [I.V.:3069.3] Out: 3816 [Urine:40; Emesis/NG output:1650; Other:2126]   Intake/Output this shift:    Weight change: -5.3 kg (-11 lb 11 oz)  Physical Exam: Gen: WD obese AAM  CVS:no rub Resp:occ rhonchi NWG:NFAOZHAbd:benign Ext:no edema, LUE AVF +T/B  Labs: BMET  Recent Labs Lab 01/09/15 1359  01/10/15 0359  01/10/15 1423 01/10/15 1900 01/10/15 2230 01/11/15 0230 01/11/15 0700 01/11/15 2100 01/12/15 0345  NA 136  < >  --   < > 139 139 138 136 137 137 138  K 3.4*  < >  --   < > 4.9 6.2* 6.1* 5.7* 4.4 5.1 5.0  CL 99  < >  --   < > 104 106 104 103 100 98 99  CO2 21  < >  --   < > 22 23 23 26 30 30 28   GLUCOSE 245*  < >  --   < > 91 79 68* 75 77 80 80  BUN 45*  < >  --   < > 54* 56* 58* 58* 25* 36* 39*  CREATININE 8.51*  < >  --   < > 9.41* 9.46* 9.74* 9.68* 5.41* 7.81* 8.12*  CALCIUM 8.8  < >  --   < > 9.3 9.3 8.9 9.2 8.5 9.1 9.1  PHOS 6.4*  --  5.1*  --   --   --   --   --   --   --   --   < > = values in this interval not displayed. CBC  Recent Labs Lab 01/09/15 1315  01/10/15 0359 01/11/15 0230 01/12/15 0001 01/12/15 0345  WBC 9.1  --  15.5* 15.3* 14.2* 13.2*  NEUTROABS 5.4  --   --   --   --   --   HGB 11.9*  < > 12.5* 13.0 11.9* 12.2*  HCT 36.9*  < > 37.6* 38.4* 36.2* 37.2*  MCV 90.2  --  87.6 87.5 88.3 90.5  PLT 175  --  163 188 144* 158  < > = values in this interval not displayed.  @IMGRELPRIORS @ Medications:    . antiseptic oral rinse  7 mL Mouth Rinse QID  . aspirin  300 mg Rectal Daily  . chlorhexidine  15 mL Mouth Rinse BID  . doxercalciferol  14 mcg  Intravenous Q M,W,F-HD  . insulin aspart  0-15 Units Subcutaneous 6 times per day  . pantoprazole sodium  40 mg Per Tube Daily   HD: [facility] 4.5h 500/800 129.5kg 2/2.0 [place] Heparin 9000 LUA AVF No aranesp or Fe, Hectorol 14 ug tiw  Assessment/ Plan:   1. S/p Cardiac arrest (VT/VF) s/p defib x 3 with prolonged down time. Plan per cardiology 2. ?anoxic encphelopathy- work up underway per neuro 3. VDRF- per PCCM 4. ESRD- had abbreviated session last night but stopped due to hypotension. Will try again tomorrow unless PCCM feels that he needs more aggressive volume removal and then could transition to CVVHD, however will plan for HD tomorrow (cont MWF) 5. Anemia: stable  6. Hypoglycemia- recurrent. Will start D10 and follow 7. Nutrition:npo for now due to concern for SBO (worrisome for fecal material in OG tube) 8. Hypertension: stable 9. Polysubstance abuse- tox screen pending. 10. Dispo- awaiting EEG and CT results  Cavin Longman A 01/12/2015, 8:35 AM

## 2015-01-13 ENCOUNTER — Inpatient Hospital Stay (HOSPITAL_COMMUNITY): Payer: Medicare Other

## 2015-01-13 LAB — GLUCOSE, CAPILLARY
GLUCOSE-CAPILLARY: 60 mg/dL — AB (ref 70–99)
GLUCOSE-CAPILLARY: 81 mg/dL (ref 70–99)
Glucose-Capillary: 78 mg/dL (ref 70–99)
Glucose-Capillary: 83 mg/dL (ref 70–99)
Glucose-Capillary: 55 mg/dL — ABNORMAL LOW (ref 70–99)
Glucose-Capillary: 85 mg/dL (ref 70–99)
Glucose-Capillary: 64 mg/dL — ABNORMAL LOW (ref 70–99)
Glucose-Capillary: 99 mg/dL (ref 70–99)
Glucose-Capillary: 111 mg/dL — ABNORMAL HIGH (ref 70–99)
Glucose-Capillary: 56 mg/dL — ABNORMAL LOW (ref 70–99)
Glucose-Capillary: 59 mg/dL — ABNORMAL LOW (ref 70–99)

## 2015-01-13 LAB — DRUGS OF ABUSE SCREEN W/O ALC, ROUTINE URINE
Amphetamine Screen, Ur: NEGATIVE
BARBITURATE QUANT UR: NEGATIVE
BENZODIAZEPINES.: NEGATIVE
Cocaine Metabolites: POSITIVE — AB
Creatinine,U: 49.6 mg/dL
Marijuana Metabolite: NEGATIVE
Methadone: NEGATIVE
Opiate Screen, Urine: NEGATIVE
PHENCYCLIDINE (PCP): NEGATIVE
Propoxyphene: NEGATIVE

## 2015-01-13 LAB — RENAL FUNCTION PANEL
ANION GAP: 5 (ref 5–15)
Albumin: 2.5 g/dL — ABNORMAL LOW (ref 3.5–5.2)
BUN: 51 mg/dL — ABNORMAL HIGH (ref 6–23)
CHLORIDE: 100 meq/L (ref 96–112)
CO2: 31 mmol/L (ref 19–32)
Calcium: 9 mg/dL (ref 8.4–10.5)
Creatinine, Ser: 9.91 mg/dL — ABNORMAL HIGH (ref 0.50–1.35)
GFR, EST AFRICAN AMERICAN: 6 mL/min — AB (ref >90)
GFR, EST NON AFRICAN AMERICAN: 5 mL/min — AB (ref >90)
Glucose, Bld: 88 mg/dL (ref 70–99)
POTASSIUM: 4.6 mmol/L (ref 3.5–5.1)
Phosphorus: 7 mg/dL — ABNORMAL HIGH (ref 2.3–4.6)
Sodium: 136 mmol/L (ref 135–145)

## 2015-01-13 LAB — CBC
HEMATOCRIT: 33.4 % — AB (ref 39.0–52.0)
HEMOGLOBIN: 11 g/dL — AB (ref 13.0–17.0)
MCH: 28.6 pg (ref 26.0–34.0)
MCHC: 32.9 g/dL (ref 30.0–36.0)
MCV: 87 fL (ref 78.0–100.0)
Platelets: 157 10*3/uL (ref 150–400)
RBC: 3.84 MIL/uL — ABNORMAL LOW (ref 4.22–5.81)
RDW: 14.6 % (ref 11.5–15.5)
WBC: 10.5 10*3/uL (ref 4.0–10.5)

## 2015-01-13 MED ORDER — DEXTROSE 50 % IV SOLN
1.0000 | Freq: Once | INTRAVENOUS | Status: AC
  Administered 2015-01-18: 25 mL via INTRAVENOUS
  Filled 2015-01-13: qty 50

## 2015-01-13 MED ORDER — DEXTROSE 50 % IV SOLN
INTRAVENOUS | Status: AC
  Administered 2015-01-13: 50 mL
  Filled 2015-01-13: qty 50

## 2015-01-13 MED ORDER — ATORVASTATIN CALCIUM 80 MG PO TABS
80.0000 mg | ORAL_TABLET | Freq: Every day | ORAL | Status: DC
  Administered 2015-01-13 – 2015-01-21 (×8): 80 mg via ORAL
  Filled 2015-01-13 (×9): qty 1

## 2015-01-13 MED ORDER — ASPIRIN 81 MG PO CHEW
81.0000 mg | CHEWABLE_TABLET | Freq: Every day | ORAL | Status: DC
  Administered 2015-01-14 – 2015-01-21 (×8): 81 mg via ORAL
  Filled 2015-01-13 (×8): qty 1

## 2015-01-13 MED ORDER — DEXTROSE 50 % IV SOLN
25.0000 mL | Freq: Once | INTRAVENOUS | Status: AC
  Administered 2015-01-13: 25 mL via INTRAVENOUS

## 2015-01-13 MED ORDER — VALPROATE SODIUM 500 MG/5ML IV SOLN
500.0000 mg | Freq: Two times a day (BID) | INTRAVENOUS | Status: DC
  Administered 2015-01-13 – 2015-01-14 (×3): 500 mg via INTRAVENOUS
  Filled 2015-01-13 (×4): qty 5

## 2015-01-13 NOTE — Progress Notes (Signed)
Subjective: Patient remains intubated and unresponsive.  Continues to require sedation.  Objective: Current vital signs: BP 136/72 mmHg  Pulse 98  Temp(Src) 98.2 F (36.8 C) (Oral)  Resp 23  Ht  (1.88 m)  Wt 140.2 kg (309 lb 1.4 oz)  BMI 39.67 kg/m2  SpO2 97% Vital signs in last 24 hours: Temp:  [98.2 F (36.8 C)-99 F (37.2 C)] 98.2 F (36.8 C) (01/20 0802) Pulse Rate:  [34-102] 98 (01/20 1115) Resp:  [4-30] 23 (01/20 1115) BP: (92-195)/(42-103) 136/72 mmHg (01/20 1115) SpO2:  [93 %-100 %] 97 % (01/20 0828) Arterial Line BP: (84-187)/(65-179) 84/66 mmHg (01/20 0900) FiO2 (%):  [40 %-50 %] 40 % (01/20 0828) Weight:  [135.3 kg (298 lb 4.5 oz)-140.2 kg (309 lb 1.4 oz)] 140.2 kg (309 lb 1.4 oz) (01/20 0802)  Intake/Output from previous day: 01/19 0701 - 01/20 0700 In: 2462.8 [I.V.:2462.8] Out: 735 [Urine:85; Emesis/NG output:650] Intake/Output this shift: Total I/O In: 237 [I.V.:237] Out: -  Nutritional status:    Neurologic Exam: Neurologic Exam: Mental Status: Patient does not respond to verbal stimuli. Does not respond to deep sternal rub. Does not follow commands. No verbalizations noted.  Cranial Nerves: II: patient does not respond confrontation bilaterally, pupils right 2 mm, left 2 mm,and reactive bilaterally III,IV,VI: doll's response absent bilaterally.  V,VII: corneal reflex present bilaterally  VIII: patient does not respond to verbal stimuli IX,X: gag reflex reduced, XI: trapezius strength unable to test bilaterally XII: tongue strength unable to test Motor: Extremities flaccid throughout. No spontaneous movement noted. No purposeful movements noted. Sensory: Does not respond to noxious stimuli in any extremity. Deep Tendon Reflexes:  1+ throughout. Plantars: mute bilaterally  Lab Results: Basic Metabolic Panel:  Recent Labs Lab 01/09/15 1359  01/10/15 0359  01/11/15 0230 01/11/15 0700 01/11/15 2100 01/12/15 0345 01/13/15 0300   NA 136  < >  --   < > 136 137 137 138 136  K 3.4*  < >  --   < > 5.7* 4.4 5.1 5.0 4.6  CL 99  < >  --   < > 103 100 98 99 100  CO2 21  < >  --   < > GLUCOSE 245*  < >  --   < > 75 77 80 80 88  BUN 45*  < >  --   < > 58* 25* 36* 39* 51*  CREATININE 8.51*  < >  --   < > 9.68* 5.41* 7.81* 8.12* 9.91*  CALCIUM 8.8  < >  --   < > 9.2 8.5 9.1 9.1 9.0  MG 2.3  --  2.3  --   --   --   --   --   --   PHOS 6.4*  --  5.1*  --   --   --   --   --  7.0*  < > = values in this interval not displayed.  Liver Function Tests:  Recent Labs Lab 01/13/15 0300  ALBUMIN 2.5*   No results for input(s): LIPASE, AMYLASE in the last 168 hours. No results for input(s): AMMONIA in the last 168 hours.  CBC:  Recent Labs Lab 01/09/15 1315  01/10/15 0359 01/11/15 0230 01/12/15 0001 01/12/15 0345 01/13/15 0300  WBC 9.1  --  15.5* 15.3* 14.2* 13.2* 10.5  NEUTROABS 5.4  --   --   --   --   --   --   HGB 11.9*  < >  12.5* 13.0 11.9* 12.2* 11.0*  HCT 36.9*  < > 37.6* 38.4* 36.2* 37.2* 33.4*  MCV 90.2  --  87.6 87.5 88.3 90.5 87.0  PLT 175  --  163 188 144* 158 157  < > = values in this interval not displayed.  Cardiac Enzymes:  Recent Labs Lab 01/09/15 1359 01/10/15 0159 01/10/15 0637  TROPONINI 0.80* 0.78* 0.69*    Lipid Panel: No results for input(s): CHOL, TRIG, HDL, CHOLHDL, VLDL, LDLCALC in the last 168 hours.  CBG:  Recent Labs Lab 01/12/15 2015 01/13/15 0016 01/13/15 0351 01/13/15 0754 01/13/15 0830  GLUCAP 74 78 83 55* 85    Microbiology: Results for orders placed or performed during the hospital encounter of 01/09/15  MRSA PCR Screening     Status: None   Collection Time: 01/09/15  5:31 PM  Result Value Ref Range Status   MRSA by PCR NEGATIVE NEGATIVE Final    Comment:        The GeneXpert MRSA Assay (FDA approved for NASAL specimens only), is one component of a comprehensive MRSA colonization surveillance program. It is not intended to diagnose  MRSA infection nor to guide or monitor treatment for MRSA infections.     Coagulation Studies: No results for input(s): LABPROT, INR in the last 72 hours.  Imaging: Ct Head Wo Contrast  01/12/2015   CLINICAL DATA:  Cerebral anoxia following cardiac arrest.  EXAM: CT HEAD WITHOUT CONTRAST  TECHNIQUE: Contiguous axial images were obtained from the base of the skull through the vertex without intravenous contrast.  COMPARISON:  01/09/2015  FINDINGS: There is no evidence of acute cortical infarct, intracranial hemorrhage, mass, midline shift, or extra-axial fluid collection. Gray-white differentiation is preserved without clear evidence of cerebral edema. Ventricles and sulci are unchanged in size and configuration.  Mastoid air cells are clear. Endotracheal and enteric tubes are partially visualized. Fluid is present in the left frontal sinus, multiple left ethmoid air cells, sphenoid sinuses, and nasal cavity. Orbits are unremarkable.  IMPRESSION: No acute intracranial abnormality identified.   Electronically Signed   By: Sebastian AcheAllen  Grady   On: 01/12/2015 14:36   Dg Chest Port 1 View  01/13/2015   CLINICAL DATA:  Respiratory failure .  EXAM: PORTABLE CHEST - 1 VIEW  COMPARISON:  01/12/2015.  FINDINGS: Tracheostomy tube and NG tube in stable position. Right IJ line stable position. Persistent severe cardiomegaly with bilateral pulmonary alveolar infiltrates and pleural effusions consistent with congestive heart failure.  IMPRESSION: 1. Lines and tubes in stable position. 2. Persistent severe cardiomegaly with bilateral pulmonary alveolar infiltrates and pleural effusions consistent with congestive heart failure. Superimposed pneumonia cannot be excluded. No interim change.   Electronically Signed   By: Maisie Fushomas  Register   On: 01/13/2015 07:38   Dg Chest Port 1 View  01/12/2015   CLINICAL DATA:  Cardiac arrest.  EXAM: PORTABLE CHEST - 1 VIEW  COMPARISON:  01/11/2015 .  FINDINGS: Lines and tubes in stable  position. Cardiomegaly. Bilateral pulmonary alveolar infiltrates again noted. Small bilateral pleural effusions. No pneumothorax.  IMPRESSION: 1. Lines and tubes in stable position. 2. Persistent severe cardiomegaly. Bilateral pulmonary infiltrates most likely secondary to congestive heart failure and pulmonary edema. Small bilateral pleural effusions are present . No significant interim change.   Electronically Signed   By: Maisie Fushomas  Register   On: 01/12/2015 07:20    Medications:  I have reviewed the patient's current medications. Scheduled: . antiseptic oral rinse  7 mL Mouth Rinse QID  . aspirin  300 mg Rectal Daily  . chlorhexidine  15 mL Mouth Rinse BID  . dextrose  1 ampule Intravenous Once  . doxercalciferol  14 mcg Intravenous Q M,W,F-HD  . insulin aspart  0-15 Units Subcutaneous 6 times per day  . pantoprazole sodium  40 mg Per Tube Daily    Assessment/Plan: Sedation continues due to asynchronous respiratory effort therefore no examination has been possible off sedation.  Head CT personally reviewed and shows no acute changes.  EEG shows some bioccipital sharp activity of unclear significance.  No evidence of NCSE.   With normal CT, continued metabolic issues and the absence of a clear examination, prognostication is very difficult at this time.    Recommendations: 1.  Depacon  IV q 12 hours 2.  VPA level in AM 3.  Consider MRI of the brain   LOS: 4 days   Thana Farr, MD [facility] [phone] 01/13/2015  11:34 AM

## 2015-01-13 NOTE — Progress Notes (Signed)
Patient ID: [name] [name], male   DOB: [birth year], 54 y.o.   MRN: [id number]   SUBJECTIVE:   Patient has been rewarmed.  Remains agitated on vent. Occasionally withdraws to pain. Tolerating HD.  Scheduled Meds: . antiseptic oral rinse  7 mL Mouth Rinse QID  . aspirin  300 mg Rectal Daily  . chlorhexidine  15 mL Mouth Rinse BID  . dextrose  1 ampule Intravenous Once  . doxercalciferol  14 mcg Intravenous Q M,W,F-HD  . insulin aspart  0-15 Units Subcutaneous 6 times per day  . pantoprazole sodium  40 mg Per Tube Daily   Continuous Infusions: . sodium chloride Stopped (01/12/15 2000)  . dextrose 75 mL/hr at 01/13/15 0336  . fentaNYL infusion INTRAVENOUS 150 mcg/hr (01/13/15 0629)  . midazolam (VERSED) infusion 2 mg/hr (01/13/15 0336)  . niCARDipine Stopped (01/10/15 1430)  . norepinephrine (LEVOPHED) Adult infusion Stopped (01/09/15 2032)   PRN Meds:.sodium chloride, sodium chloride, calcium chloride, feeding supplement (NEPRO CARB STEADY), fentaNYL, heparin, heparin, hydrALAZINE, lidocaine (PF), lidocaine-prilocaine, midazolam, pentafluoroprop-tetrafluoroeth    Filed Vitals:   01/13/15 1030 01/13/15 1045 01/13/15 1100 01/13/15 1115  BP: 130/58 109/54 102/64 136/72  Pulse: 98 96 95 98  Temp:      TempSrc:      Resp: 18 16 20 23   Height:      Weight:      SpO2:        Intake/Output Summary (Last 24 hours) at 01/13/15 1135 Last data filed at 01/13/15 0900  Gross per 24 hour  Intake 2219.58 ml  Output    685 ml  Net 1534.58 ml    LABS: Basic Metabolic Panel:  Recent Labs  40/98/11 0345 01/13/15 0300  NA 138 136  K 5.0 4.6  CL 99 100  CO2 28 31  GLUCOSE 80 88  BUN 39* 51*  CREATININE 8.12* 9.91*  CALCIUM 9.1 9.0  PHOS  --  7.0*   Liver Function Tests:  Recent Labs  01/13/15 0300  ALBUMIN 2.5*   No results for input(s): LIPASE, AMYLASE in the last 72 hours. CBC:  Recent Labs  01/12/15 0345 01/13/15 0300  WBC 13.2* 10.5  HGB 12.2* 11.0*  HCT  37.2* 33.4*  MCV 90.5 87.0  PLT 158 157   Cardiac Enzymes: No results for input(s): CKTOTAL, CKMB, CKMBINDEX, TROPONINI in the last 72 hours. BNP: Invalid input(s): POCBNP D-Dimer: No results for input(s): DDIMER in the last 72 hours. Hemoglobin A1C: No results for input(s): HGBA1C in the last 72 hours. Fasting Lipid Panel: No results for input(s): CHOL, HDL, LDLCALC, TRIG, CHOLHDL, LDLDIRECT in the last 72 hours. Thyroid Function Tests: No results for input(s): TSH, T4TOTAL, T3FREE, THYROIDAB in the last 72 hours.  Invalid input(s): FREET3 Anemia Panel: No results for input(s): VITAMINB12, FOLATE, FERRITIN, TIBC, IRON, RETICCTPCT in the last 72 hours.  RADIOLOGY: Ct Head Wo Contrast  01/12/2015   CLINICAL DATA:  Cerebral anoxia following cardiac arrest.  EXAM: CT HEAD WITHOUT CONTRAST  TECHNIQUE: Contiguous axial images were obtained from the base of the skull through the vertex without intravenous contrast.  COMPARISON:  01/09/2015  FINDINGS: There is no evidence of acute cortical infarct, intracranial hemorrhage, mass, midline shift, or extra-axial fluid collection. Gray-white differentiation is preserved without clear evidence of cerebral edema. Ventricles and sulci are unchanged in size and configuration.  Mastoid air cells are clear. Endotracheal and enteric tubes are partially visualized. Fluid is present in the left frontal sinus, multiple left ethmoid air cells, sphenoid  sinuses, and nasal cavity. Orbits are unremarkable.  IMPRESSION: No acute intracranial abnormality identified.   Electronically Signed   By: Sebastian Ache   On: 01/12/2015 14:36   Ct Head Wo Contrast  01/09/2015   CLINICAL DATA:  Post code.  Question anoxic brain injury.  EXAM: CT HEAD WITHOUT CONTRAST  TECHNIQUE: Contiguous axial images were obtained from the base of the skull through the vertex without intravenous contrast.  COMPARISON:  None.  FINDINGS: No acute intracranial abnormality. Specifically, no  hemorrhage, hydrocephalus, mass lesion, acute infarction, or significant intracranial injury. No acute calvarial abnormality. No current CT evidence or not brain injury.  Extensive soft tissue thickening within the nasal airway and ethmoid air cells as well as maxillary sinuses and sphenoid sinus. Air-fluid levels in the sphenoid sinuses. Mastoid air cells are clear.  IMPRESSION: No acute intracranial abnormality.   Electronically Signed   By: Charlett Nose M.D.   On: 01/09/2015 14:40   Dg Chest Port 1 View  01/13/2015   CLINICAL DATA:  Respiratory failure .  EXAM: PORTABLE CHEST - 1 VIEW  COMPARISON:  01/12/2015.  FINDINGS: Tracheostomy tube and NG tube in stable position. Right IJ line stable position. Persistent severe cardiomegaly with bilateral pulmonary alveolar infiltrates and pleural effusions consistent with congestive heart failure.  IMPRESSION: 1. Lines and tubes in stable position. 2. Persistent severe cardiomegaly with bilateral pulmonary alveolar infiltrates and pleural effusions consistent with congestive heart failure. Superimposed pneumonia cannot be excluded. No interim change.   Electronically Signed   By: Maisie Fus  Register   On: 01/13/2015 07:38   Dg Chest Port 1 View  01/12/2015   CLINICAL DATA:  Cardiac arrest.  EXAM: PORTABLE CHEST - 1 VIEW  COMPARISON:  01/11/2015 .  FINDINGS: Lines and tubes in stable position. Cardiomegaly. Bilateral pulmonary alveolar infiltrates again noted. Small bilateral pleural effusions. No pneumothorax.  IMPRESSION: 1. Lines and tubes in stable position. 2. Persistent severe cardiomegaly. Bilateral pulmonary infiltrates most likely secondary to congestive heart failure and pulmonary edema. Small bilateral pleural effusions are present . No significant interim change.   Electronically Signed   By: Maisie Fus  Register   On: 01/12/2015 07:20   Dg Chest Port 1 View  01/11/2015   CLINICAL DATA:  Acute respiratory failure.  EXAM: PORTABLE CHEST - 1 VIEW  COMPARISON:   01/10/2015  FINDINGS: There is an endotracheal tube with tip at the clavicular heads. A gastric suction tube reaches the diaphragm. Stable positioning of right IJ central line, tip at the SVC.  Unchanged cardiomegaly.  Unchanged upper mediastinal contours.  Central pulmonary venous congestion. Increased hazy lower lung opacities suggesting layering pleural fluid. There are probable superimposed lower lung opacities. No pneumothorax.  IMPRESSION: 1. Unchanged positioning of tubes and central line. 2. Increasing or layering bilateral pleural effusions. Pulmonary venous congestion and basilar lung opacification is unchanged.   Electronically Signed   By: Tiburcio Pea M.D.   On: 01/11/2015 02:55   Dg Chest Port 1 View  01/10/2015   CLINICAL DATA:  Subsequent encounter for altered mental status. History of hypertension.  EXAM: PORTABLE CHEST - 1 VIEW  COMPARISON:  01/09/2015  FINDINGS: Endotracheal tube difficult to visualize centrally. Nasogastric extends beyond the inferior aspect of the film. Right internal jugular line tip mid SVC.  Numerous leads and wires project over the chest. Cardiomegaly accentuated by AP portable technique. Developing left greater than right pleural effusions. No pneumothorax. Interstitial edema is moderate. Progressive.  IMPRESSION: Worsened aeration, with increased congestive heart  failure, developing pleural effusions, and left greater than right bibasilar airspace disease.   Electronically Signed   By: Jeronimo GreavesKyle  Talbot M.D.   On: 01/10/2015 09:30   Dg Chest Port 1 View  01/09/2015   CLINICAL DATA:  Central line placement  EXAM: PORTABLE CHEST - 1 VIEW  COMPARISON:  01/09/2015  FINDINGS: Right internal jugular central line is in place with the tip in the SVC. No pneumothorax. Endotracheal tube and NG tube remain in place, unchanged. Cardiomegaly. Vascular congestion. No real change since prior study.  IMPRESSION: Right central line tip in the SVC.  No pneumothorax.   Electronically  Signed   By: Charlett NoseKevin  Dover M.D.   On: 01/09/2015 15:20   Dg Chest Portable 1 View  01/09/2015   CLINICAL DATA:  Cardiac arrest.  Intubated.  EXAM: PORTABLE CHEST - 1 VIEW  COMPARISON:  12/02/2013.  FINDINGS: Endotracheal tube in satisfactory position. Nasogastric tube extending into the stomach. Stable enlarged cardiac silhouette. Support apparatus obscuring portions of the chest with no gross lung infiltrates. There is a linear lucency overlying the mediastinum on the left. No definite pneumothorax seen.  IMPRESSION: Limited examination with a linear artifact or pneumomediastinum noted on the left.   Electronically Signed   By: Gordan PaymentSteve  Reid M.D.   On: 01/09/2015 14:27   Dg Abd Portable 1v  01/11/2015   CLINICAL DATA:  Ileus  EXAM: PORTABLE ABDOMEN - 1 VIEW  COMPARISON:  None.  FINDINGS: There is nonspecific nonobstructive small bowel gas pattern. Mild colonic distension with gas and some stool probable mild colonic ileus. Some gas noted within stomach. NG tube in place. The left colon is not distended.  IMPRESSION: Nonobstructed small bowel gas pattern. Mild colonic distension with gas and stool probable mild ileus. Some gas noted within stomach. NG tube in place PICC   Electronically Signed   By: Natasha MeadLiviu  Pop M.D.   On: 01/11/2015 10:38    PHYSICAL EXAM General: Sedated on vent.  Neck: No JVD, no thyromegaly or thyroid nodule.  Lungs: Coarse breath sounds bilaterally.  CV: Nondisplaced PMI.  Heart regular S1/S2, no S3/S4, no murmur.  No peripheral edema.   Abdomen: Soft, nontender, no hepatosplenomegaly, no distention.  Neurologic: Opens eyes. Does not respond to pain for me Extremities: No clubbing or cyanosis. LUE AVF with HD connected  TELEMETRY: Reviewed telemetry pt in NSR  ASSESSMENT AND PLAN: 54 yo with history of ESRD and CAD at prior cath was admitted after cardiac arrest.  1. Cardiac arrest: VT/VF with defibrillation x 3.  Prolonged down-time.  He underwent hypothermia and is now  rewarmed.  EF 55-60% on echo.  No significant change on ECG from prior.  No purposeful movement so far.  Possibly related to cocaine. Doubt true NSTEMI - If patient shows neurologic recovery, will need cardiac cath.  - Continue ASA 81.  2. Anoxic encephalopathy: See above, continue to monitor for recovery.  He is now off sedation and withdraws from painful stimuli.  Neurology following.  3. ESRD: BP dropping with HD, off BP meds.  4. HTN: Will give prn hydralazine. 5. Acute respiratory failure: per CCM   6. Cocaine abuse. UDS + this admit 7. CAD -  cardiac catheterization after a non-STEMI in the setting of cocaine abuse in 2014 . Showed a stenosis in a diagonal branch. This was not intervened upon due to patient noncompliance with dialysis and drug abuse. Continue ASA. Start statin.   Continue supportive care. Cath if/when he recovers. If cath  negative will need to continue EP consult for possible ICD.   Arvilla Meres MD 01/13/2015 11:35 AM

## 2015-01-13 NOTE — Procedures (Signed)
Patient was seen on dialysis and the procedure was supervised. BFR 400 Via LUE AVF BP is 99/47.  Patient appears to be tolerating treatment well but dropped BP.  Will take out of UF and keep SBP>100.  EEG was nondiagnostic, ?need for MRI to evaluate for anoxic injury.  Neuro following.  Cont with HD qMWF for now.

## 2015-01-13 NOTE — Progress Notes (Signed)
eLink Physician-Brief Progress Note Patient Name: [name] Nevinsugene N Cordy DOB: [birth year] MRN: [id number]   Date of Service  01/13/2015  HPI/Events of Note  Hypoglycemia despite D10W @ 75 cc/hr  eICU Interventions  1) DC SSI 2) Amp D50 per protocol 3) Check hepatic function panel in AM 1/21     Intervention Category Intermediate Interventions: Other:  Billy FischerDavid Jeziah Kretschmer 01/13/2015, 8:54 PM

## 2015-01-13 NOTE — Progress Notes (Signed)
PULMONARY / CRITICAL CARE MEDICINE   Name: [name] [name] MRN: [id number] DOB: 10-09-1961    ADMISSION DATE:  01/09/2015   REFERRING MD :  ED  CHIEF COMPLAINT:  VT arrest  INITIAL PRESENTATION: Intubated post arrest  PATIENT SUMMARY:   54 yo ESRD , poorly compliant with HD MWF admitted after a cardia arrest on 1/16. He had 10 minutes of CPR along with one shock applied. Intubated in the field. Hypothermia initiated on arrival and taken to Cath lab urgently.  STUDIES:  1/16 CT head>> no acute abnormalities 1/18 Abdominal xray >> nonobstructive small bowel gas pattern. ? Mild colonic ileus 1/19 repeat Head ct scan >> normal    SIGNIFICANT EVENTS: 1/16 VT/V F arrest 1/16 Hypothermia >> 1/16 CVL right IJ >> 1/16 A-line rt radial >>  1/18 HD attempted; did not tolerate it very well due to hypotension, did have some volume removal 1/18 Consulted neurology for prognostication, planning EEG and head ct scan   SUBJECTIVE:  Complete hypothermia protocol this morning.  Attempting dialysis this morning but BP is low Was agitated with SBT yesterday  VITAL SIGNS: Temp:  [98.2 F (36.8 C)-99 F (37.2 C)] 98.2 F (36.8 C) (01/20 0802) Pulse Rate:  [34-102] 94 (01/20 0900) Resp:  [4-30] 20 (01/20 0900) BP: (92-195)/(42-103) 115/47 mmHg (01/20 0900) SpO2:  [93 %-100 %] 97 % (01/20 0802) Arterial Line BP: (94-187)/(65-179) 106/93 mmHg (01/20 0800) FiO2 (%):  [40 %-50 %] 40 % (01/20 0445) Weight:  [298 lb 4.5 oz (135.3 kg)-309 lb 1.4 oz (140.2 kg)] 309 lb 1.4 oz (140.2 kg) (01/20 0802) HEMODYNAMICS: CVP:  [6 mmHg-9 mmHg] 9 mmHg VENTILATOR SETTINGS: Vent Mode:  [-] PRVC FiO2 (%):  [40 %-50 %] 40 % Set Rate:  [20 bmp] 20 bmp Vt Set:  [600 mL] 600 mL PEEP:  [5 cmH20] 5 cmH20 Plateau Pressure:  [18 cmH20-20 cmH20] 19 cmH20 INTAKE / OUTPUT:  Intake/Output Summary (Last 24 hours) at 01/13/15 1191 Last data filed at 01/13/15 0800  Gross per 24 hour  Intake 2326.33 ml  Output     710 ml  Net 1616.33 ml    PHYSICAL EXAMINATION: General:  Obese AAM Neuro:  Obtunded, Small pupils with minimal reaction to light HEENT: Left ij av graft Cardiovascular:  HSR, on HD Lungs:  Coarse rhonchi, with vent dysychrony at times. Abdomen:  Obese + b s. OGT with dark appearing secretions. Nonbloody Musculoskeletal:  Intact,  Skin:  Left arm av graft x 2, protuberant pulsatile. No signs of infecttion LABS:  CBC  Recent Labs Lab 01/12/15 0001 01/12/15 0345 01/13/15 0300  WBC 14.2* 13.2* 10.5  HGB 11.9* 12.2* 11.0*  HCT 36.2* 37.2* 33.4*  PLT 144* 158 157   Coag's  Recent Labs Lab 01/09/15 1315 01/09/15 2109 01/09/15 2309  APTT 32 34 33  INR 1.19 1.24 1.23   BMET  Recent Labs Lab 01/11/15 2100 01/12/15 0345 01/13/15 0300  NA 137 138 136  K 5.1 5.0 4.6  CL 98 99 100  CO2 BUN 36* 39* 51*  CREATININE 7.81* 8.12* 9.91*  GLUCOSE 80 80 88   Electrolytes  Recent Labs Lab 01/09/15 1359  01/10/15 0359  01/11/15 2100 01/12/15 0345 01/13/15 0300  CALCIUM 8.8  < >  --   < > 9.1 9.1 9.0  MG 2.3  --  2.3  --   --   --   --   PHOS 6.4*  --  5.1*  --   --   --  7.0*  < > = values in this interval not displayed. Sepsis Markers No results for input(s): LATICACIDVEN, PROCALCITON, O2SATVEN in the last 168 hours. ABG  Recent Labs Lab 01/10/15 0444 01/11/15 0243 01/11/15 1011  PHART 7.329* 7.239* 7.323*  PCO2ART 42.7 54.9* 54.9*  PO2ART 78.7* 97.0 77.0*   Liver Enzymes  Recent Labs Lab 01/13/15 0300  ALBUMIN 2.5*   Cardiac Enzymes  Recent Labs Lab 01/09/15 1359 01/10/15 0159 01/10/15 0637  TROPONINI 0.80* 0.78* 0.69*   Glucose  Recent Labs Lab 01/12/15 1614 01/12/15 2015 01/13/15 0016 01/13/15 0351 01/13/15 0754 01/13/15 0830  GLUCAP 72 74 78 83 55* 85    Imaging Ct Head Wo Contrast  01/12/2015   CLINICAL DATA:  Cerebral anoxia following cardiac arrest.  EXAM: CT HEAD WITHOUT CONTRAST  TECHNIQUE: Contiguous axial  images were obtained from the base of the skull through the vertex without intravenous contrast.  COMPARISON:  01/09/2015  FINDINGS: There is no evidence of acute cortical infarct, intracranial hemorrhage, mass, midline shift, or extra-axial fluid collection. Gray-white differentiation is preserved without clear evidence of cerebral edema. Ventricles and sulci are unchanged in size and configuration.  Mastoid air cells are clear. Endotracheal and enteric tubes are partially visualized. Fluid is present in the left frontal sinus, multiple left ethmoid air cells, sphenoid sinuses, and nasal cavity. Orbits are unremarkable.  IMPRESSION: No acute intracranial abnormality identified.   Electronically Signed   By: Sebastian Ache   On: 01/12/2015 14:36   Dg Chest Port 1 View  01/12/2015   CLINICAL DATA:  Cardiac arrest.  EXAM: PORTABLE CHEST - 1 VIEW  COMPARISON:  01/11/2015 .  FINDINGS: Lines and tubes in stable position. Cardiomegaly. Bilateral pulmonary alveolar infiltrates again noted. Small bilateral pleural effusions. No pneumothorax.  IMPRESSION: 1. Lines and tubes in stable position. 2. Persistent severe cardiomegaly. Bilateral pulmonary infiltrates most likely secondary to congestive heart failure and pulmonary edema. Small bilateral pleural effusions are present . No significant interim change.   Electronically Signed   By: Maisie Fus  Register   On: 01/12/2015 07:20   Portable cxr 1/20 >> tubes in good position. Persistent cardiomegaly with bilateral infiltrates. Small bil pleural effusion consistent with heart failure. ? Superimposed PNA  ASSESSMENT / PLAN:  PULMONARY OETT 1/16 per ems>> Increased B pleural effusions ?due to increased volume  ? Pulmonary edema vs PNA per chest x ray 1/20 A: VDRF post VT arrest Hypothermia protocol P:   Vent bundle, agitation with weaning On PRVC SBT as able to tolerate Consider thora if we believe this will facilitate weaning  CARDIOVASCULAR A:  Post arrest  VT/VF shocked x 3 HTN > improving on Cardene gtt Hx of CAD inferior Q waves and ST elevation inferiorly on old ECGs Mild trop elevation 0.8 >0.78>0.69 Hypotension with HD P:  Start levo infusion for hypotension 1/20 if unable to tolerate his HD Card cath deferred pending neurologic improvement Trop elevation stable, stress NSTEMI Cards following   RENAL A:   ESRD HD M W F Hypokalemia evolved to hyperkalemia.  Unable to tolerate HD due to low BP P:   Appreciate Renal consult HD complicated due to low BP  Attempting HD this morning 1/20 Monitor renal function   GASTROINTESTINAL A:   GI protection 1/18 noticed increased GI secretions, ? SBO / ileus. At some risk for bowel ischemia 1/18 Abdominal xray >> nonobstructive small bowel gas pattern. ? Mild colonic ileus ? Red tinged secretions, minimal P:   Cbc stable hgb at around  11 No evidence of significant bleeding in GI tract. Will monitor clinically Cont with PIP for SUP  HEMATOLOGIC A:   Leukocytosis >> resolved  P:  Follow trend  INFECTIOUS A:   No infectious process.  Leukocytosis is likely WBC demargination No fevers since 1/18 P:   No abx for now  Follow clinically and Cx if fever returns.  ENDOCRINE A:   Hypoglycemia P:   Increase D10 rate for hypoglycemia from 50 to 100cc/hr  CBG q4h.   NEUROLOGIC A:   Significant anoxia brain injury Post arrest non responsive. Head ct scan 1/16 and 1/19 both nl  P:   RASS goal: -1 to 0 1/18 Consulted neurology to assist with prognosis. 1/19 EEG indicating moderate to severe cerebral dysfunction (encephalopathy).    Family: I updated family 1/19 evening.    Case discussed with Dr Delton CoombesByrum   Signed:  Dow Adolphichard Kazibwe, MD PGY-3 Internal Medicine Teaching Service Pager: [phone] 01/13/2015, 9:04 AM   Attending Note:  I have examined patient, reviewed labs, studies and notes. I have discussed the case with Dr Zada GirtKazibwe, and I agree with the data and plans as  amended above. He has completed hypothermia phase after cardiac arrest and prolonged time to resuscitation. Has shown some agitation with decreased sedation. Difficult to truly assess MS or for brain injury due to his sedation requirement. Will attempt to lighten sedation 1/20 after HD and assess further. No evidence seizures on EEG from 1/19. Will review status with family and discuss goals of care.  Independent critical care time is 50 minutes.   Levy Pupaobert Lileigh Fahringer, MD, PhD 01/13/2015, 11:02 AM [facility] [phone] or if no answer [phone]

## 2015-01-14 ENCOUNTER — Inpatient Hospital Stay (HOSPITAL_COMMUNITY): Payer: Medicare Other

## 2015-01-14 LAB — BASIC METABOLIC PANEL
Anion gap: 10 (ref 5–15)
BUN: 29 mg/dL — AB (ref 6–23)
CALCIUM: 8.9 mg/dL (ref 8.4–10.5)
CHLORIDE: 95 meq/L — AB (ref 96–112)
CO2: 30 mmol/L (ref 19–32)
Creatinine, Ser: 7.42 mg/dL — ABNORMAL HIGH (ref 0.50–1.35)
GFR calc non Af Amer: 7 mL/min — ABNORMAL LOW (ref >90)
GFR, EST AFRICAN AMERICAN: 9 mL/min — AB (ref >90)
GLUCOSE: 73 mg/dL (ref 70–99)
Potassium: 3.9 mmol/L (ref 3.5–5.1)
SODIUM: 135 mmol/L (ref 135–145)

## 2015-01-14 LAB — CBC
HCT: 34 % — ABNORMAL LOW (ref 39.0–52.0)
Hemoglobin: 11.2 g/dL — ABNORMAL LOW (ref 13.0–17.0)
MCH: 29.6 pg (ref 26.0–34.0)
MCHC: 32.9 g/dL (ref 30.0–36.0)
MCV: 89.7 fL (ref 78.0–100.0)
Platelets: 145 10*3/uL — ABNORMAL LOW (ref 150–400)
RBC: 3.79 MIL/uL — ABNORMAL LOW (ref 4.22–5.81)
RDW: 14.7 % (ref 11.5–15.5)
WBC: 8.3 10*3/uL (ref 4.0–10.5)

## 2015-01-14 LAB — HEPATIC FUNCTION PANEL
ALBUMIN: 2.4 g/dL — AB (ref 3.5–5.2)
ALT: 23 U/L (ref 0–53)
AST: 17 U/L (ref 0–37)
Alkaline Phosphatase: 57 U/L (ref 39–117)
Bilirubin, Direct: 0.2 mg/dL (ref 0.0–0.5)
Indirect Bilirubin: 0.7 mg/dL (ref 0.3–0.9)
TOTAL PROTEIN: 5.8 g/dL — AB (ref 6.0–8.3)
Total Bilirubin: 0.9 mg/dL (ref 0.3–1.2)

## 2015-01-14 LAB — GLUCOSE, CAPILLARY
GLUCOSE-CAPILLARY: 81 mg/dL (ref 70–99)
GLUCOSE-CAPILLARY: 89 mg/dL (ref 70–99)
Glucose-Capillary: 102 mg/dL — ABNORMAL HIGH (ref 70–99)
Glucose-Capillary: 67 mg/dL — ABNORMAL LOW (ref 70–99)
Glucose-Capillary: 75 mg/dL (ref 70–99)
Glucose-Capillary: 90 mg/dL (ref 70–99)
Glucose-Capillary: 96 mg/dL (ref 70–99)
Glucose-Capillary: 99 mg/dL (ref 70–99)

## 2015-01-14 LAB — VALPROIC ACID LEVEL: VALPROIC ACID LVL: 15.2 ug/mL — AB (ref 50.0–100.0)

## 2015-01-14 MED ORDER — DEXTROSE 50 % IV SOLN
25.0000 mL | Freq: Once | INTRAVENOUS | Status: AC
  Administered 2015-01-14: 25 mL via INTRAVENOUS

## 2015-01-14 MED ORDER — DEXTROSE 50 % IV SOLN
INTRAVENOUS | Status: AC
  Filled 2015-01-14: qty 50

## 2015-01-14 MED ORDER — VALPROATE SODIUM 500 MG/5ML IV SOLN
1000.0000 mg | Freq: Two times a day (BID) | INTRAVENOUS | Status: DC
  Administered 2015-01-14 – 2015-01-19 (×10): 1000 mg via INTRAVENOUS
  Filled 2015-01-14 (×13): qty 10

## 2015-01-14 MED ORDER — FENTANYL CITRATE 0.05 MG/ML IJ SOLN
25.0000 ug | INTRAMUSCULAR | Status: DC | PRN
  Administered 2015-01-15 – 2015-01-18 (×14): 100 ug via INTRAVENOUS
  Administered 2015-01-19: 50 ug via INTRAVENOUS
  Administered 2015-01-19 – 2015-01-20 (×7): 100 ug via INTRAVENOUS
  Filled 2015-01-14 (×23): qty 2

## 2015-01-14 MED ORDER — VITAL HIGH PROTEIN PO LIQD
1000.0000 mL | ORAL | Status: DC
  Administered 2015-01-14: 1000 mL
  Administered 2015-01-15 (×2)
  Filled 2015-01-14 (×2): qty 1000

## 2015-01-14 NOTE — Progress Notes (Signed)
PULMONARY / CRITICAL CARE MEDICINE   Name: [name] [name] MRN: [id number] DOB: 06-02-1961    ADMISSION DATE:  01/09/2015   REFERRING MD :  ED  CHIEF COMPLAINT:  VT arrest  INITIAL PRESENTATION: Intubated post arrest  PATIENT SUMMARY:   54 yo ESRD , poorly compliant with HD MWF admitted after a cardia arrest on 1/16. He had 10 minutes of CPR along with one shock applied. Intubated in the field. Hypothermia initiated on arrival and taken to Cath lab urgently.  STUDIES:  1/16 CT head>> no acute abnormalities 1/18 Abdominal xray >> nonobstructive small bowel gas pattern. ? Mild colonic ileus 1/19 repeat Head ct scan >> normal    SIGNIFICANT EVENTS: 1/16 VT/V F arrest 1/16 Hypothermia >> 1/16 CVL right IJ >> 1/16 A-line rt radial >> 1/21 1/18 HD attempted; did not tolerate it very well due to hypotension, did have some volume removal 1/18 Consulted neurology for prognostication 1/20 Updated patient's family (cousins). Sisters not present.   SUBJECTIVE:  Cut off sedation this morning but still not responsive.  VITAL SIGNS: Temp:  [98.1 F (36.7 C)-100.2 F (37.9 C)] 100.2 F (37.9 C) (01/21 0600) Pulse Rate:  [31-104] 96 (01/21 0813) Resp:  [13-26] 18 (01/21 0813) BP: (78-163)/(45-100) 87/51 mmHg (01/21 0813) SpO2:  [81 %-100 %] 100 % (01/21 0813) Arterial Line BP: (89-137)/(55-128) 89/55 mmHg (01/20 2000) FiO2 (%):  [40 %] 40 % (01/21 0813) Weight:  [293 lb 14 oz (133.3 kg)-306 lb (138.8 kg)] 293 lb 14 oz (133.3 kg) (01/21 0400) HEMODYNAMICS: CVP:  [4 mmHg-12 mmHg] 7 mmHg VENTILATOR SETTINGS: Vent Mode:  [-] PRVC FiO2 (%):  [40 %] 40 % Set Rate:  [18 bmp-20 bmp] 18 bmp Vt Set:  [600 mL] 600 mL PEEP:  [5 cmH20] 5 cmH20 Plateau Pressure:  [12 cmH20-19 cmH20] 18 cmH20 INTAKE / OUTPUT:  Intake/Output Summary (Last 24 hours) at 01/14/15 1191 Last data filed at 01/14/15 0600  Gross per 24 hour  Intake 2569.08 ml  Output   3690 ml  Net -1120.92 ml    PHYSICAL  EXAMINATION: General:  Obese AAM Neuro:  Obtunded, Small pupils with minimal reaction to light HEENT: Left ij av graft Cardiovascular:  HSR, on HD Lungs:  Coarse rhonchi, RASS -2 Abdomen:  Obese + b s. OGT with dark appearing secretions. Nonbloody Musculoskeletal:  Intact,  Skin:  Left arm av graft x 2, protuberant pulsatile. No signs of infecttion  LABS:  CBC  Recent Labs Lab 01/12/15 0345 01/13/15 0300 01/14/15 0415  WBC 13.2* 10.5 8.3  HGB 12.2* 11.0* 11.2*  HCT 37.2* 33.4* 34.0*  PLT 158 157 145*   Coag's  Recent Labs Lab 01/09/15 1315 01/09/15 2109 01/09/15 2309  APTT 32 34 33  INR 1.19 1.24 1.23   BMET  Recent Labs Lab 01/12/15 0345 01/13/15 0300 01/14/15 0415  NA 138 136 135  K 5.0 4.6 3.9  CL 99 100 95*  CO2 BUN 39* 51* 29*  CREATININE 8.12* 9.91* 7.42*  GLUCOSE 80 88 73   Electrolytes  Recent Labs Lab 01/09/15 1359  01/10/15 0359  01/12/15 0345 01/13/15 0300 01/14/15 0415  CALCIUM 8.8  < >  --   < > 9.1 9.0 8.9  MG 2.3  --  2.3  --   --   --   --   PHOS 6.4*  --  5.1*  --   --  7.0*  --   < > = values in this  interval not displayed. Sepsis Markers No results for input(s): LATICACIDVEN, PROCALCITON, O2SATVEN in the last 168 hours. ABG  Recent Labs Lab 01/10/15 0444 01/11/15 0243 01/11/15 1011  PHART 7.329* 7.239* 7.323*  PCO2ART 42.7 54.9* 54.9*  PO2ART 78.7* 97.0 77.0*   Liver Enzymes  Recent Labs Lab 01/13/15 0300 01/14/15 0415  AST  --  17  ALT  --  23  ALKPHOS  --  57  BILITOT  --  0.9  ALBUMIN 2.5* 2.4*   Cardiac Enzymes  Recent Labs Lab 01/09/15 1359 01/10/15 0159 01/10/15 0637  TROPONINI 0.80* 0.78* 0.69*   Glucose  Recent Labs Lab 01/13/15 2121 01/13/15 2217 01/13/15 2249 01/14/15 0047 01/14/15 0114 01/14/15 0411  GLUCAP 59* 60* 81 67* 99 75    Imaging Dg Chest Port 1 View  01/13/2015   CLINICAL DATA:  Respiratory failure .  EXAM: PORTABLE CHEST - 1 VIEW  COMPARISON:  01/12/2015.   FINDINGS: Tracheostomy tube and NG tube in stable position. Right IJ line stable position. Persistent severe cardiomegaly with bilateral pulmonary alveolar infiltrates and pleural effusions consistent with congestive heart failure.  IMPRESSION: 1. Lines and tubes in stable position. 2. Persistent severe cardiomegaly with bilateral pulmonary alveolar infiltrates and pleural effusions consistent with congestive heart failure. Superimposed pneumonia cannot be excluded. No interim change.   Electronically Signed   By: Maisie Fus  Register   On: 01/13/2015 07:38   Portable cxr 1/20 >> tubes in good position. Persistent cardiomegaly with bilateral infiltrates. Small bil pleural effusion consistent with heart failure. ? Superimposed PNA  ASSESSMENT / PLAN:  PULMONARY OETT 1/16 per ems>> Increased B pleural effusions ?due to increased volume  ? Pulmonary edema vs PNA per chest x ray 1/20 A: VDRF post VT arrest Hypothermia protocol P:   Vent bundle, Will evaluate further when sedation is completely off. In the setting of poor renal function this might take a while  SBT as able to tolerate (sedation clearing) May consider thora if we believe this will facilitate weaning; defer at this time  CARDIOVASCULAR A:  Post arrest VT/VF shocked x 3 HTN > resolved with cardene gtt Hx of CAD inferior Q waves and ST elevation inferiorly on old ECGs Mild trop elevation 0.8 >0.78>0.69 Hypotension with HD 1/20 P:  Prn levo infusion for hypotension 1/20 if unable to tolerate his HD; currently normotensive Cardiac cath deferred pending neurologic improvement. If cath is neg >> EP for ICD  RENAL A:   ESRD HD M W F Hypokalemia evolved to hyperkalemia.  Unable to tolerate HD due to low BP P:   Appreciate Renal consult HD complicated due to low BP  Will attempt HD 1/21 No acute electrolytes abnormalities currently Monitor renal function   GASTROINTESTINAL A:   GI protection 1/18 noticed increased GI  secretions, ? SBO / ileus. At some risk for bowel ischemia 1/18 Abdominal xray >> nonobstructive small bowel gas pattern. ? Mild colonic ileus ? Red tinged secretions, minimal P:   Cbc stable hgb at around 11 No evidence of significant bleeding in GI tract. Will monitor clinically Cont with PIP for SUP Start TF on 1/21  HEMATOLOGIC A:   Leukocytosis >> resolved  P:  Follow cbc   INFECTIOUS A:   No clear infectious process but he does have significant purulent secretions.  Leukocytosis was likely WBC demargination No fevers since 1/18 P:   No abx for now; will cover for HCAP / asp PNA if he spikes temp, CXR evolves Follow clinically and Cx  if fever returns.  ENDOCRINE A:   Hypoglycemia P:   Iv dextrose as needed. On d10 now. Hopefully will improve with addition of TFs Monitor cbg q4h.   NEUROLOGIC A:   Significant anoxia, presumed brain injury Post arrest non responsive, but exam complicated by sedation Head ct scan 1/16 and 1/19 both nl  P:   RASS goal: -1 to 0 1/18 Consulted neurology to assist with prognosis. 1/19 EEG indicating moderate to severe cerebral dysfunction (encephalopathy).   Sedation being lightened 1/21 May merit an MRI for prognostication, especially if sedation holiday is difficult  Family: I updated sister 1/19 evening. Cousin updated 1/21 by Dr Delton CoombesByrum.   Case discussed with Dr Delton CoombesByrum   Signed:  Dow Adolphichard Kazibwe, MD PGY-3 Internal Medicine Teaching Service Pager: [phone] 01/14/2015, 9:29 AM   Attending Note:  I have examined patient, reviewed labs, studies and notes. I have discussed the case with Dr Zada GirtKazibwe and I agree with the data and plans as amended above. Pt has completed hypothermia post-arrest. Will need to better assess MS and neuro status. He has intact cough and corneals on my exam, does not blink to threat, track, move purposefully. We are lightening sedation further to allow a better assessment. Hopefully he will get HD today. Will  discuss his neuro status and prognosis with his family once sedation off. Independent critical care time is 45 minutes.   Levy Pupaobert Byrum, MD, PhD 01/14/2015, 11:04 AM [facility] [phone] or if no answer [phone]

## 2015-01-14 NOTE — Progress Notes (Signed)
Patient ID: [name] [name], male   DOB: May 02, 1961, 54 y.o.   MRN: [id number]   SUBJECTIVE:   No changes from yesterday.  Remains on Fentanyl gtt. Withdraws from painful stimuli but otherwise no response.   Scheduled Meds: . antiseptic oral rinse  7 mL Mouth Rinse QID  . aspirin  81 mg Oral Daily  . atorvastatin  80 mg Oral q1800  . chlorhexidine  15 mL Mouth Rinse BID  . dextrose  1 ampule Intravenous Once  . doxercalciferol  14 mcg Intravenous Q M,W,F-HD  . pantoprazole sodium  40 mg Per Tube Daily  . valproate sodium  500 mg Intravenous Q12H   Continuous Infusions: . sodium chloride Stopped (01/12/15 2000)  . dextrose 75 mL/hr at 01/14/15 0402  . fentaNYL infusion INTRAVENOUS 25 mcg/hr (01/14/15 0603)  . midazolam (VERSED) infusion 1 mg/hr (01/14/15 0604)  . niCARDipine Stopped (01/10/15 1430)  . norepinephrine (LEVOPHED) Adult infusion Stopped (01/09/15 2032)   PRN Meds:.sodium chloride, sodium chloride, calcium chloride, feeding supplement (NEPRO CARB STEADY), fentaNYL, heparin, heparin, hydrALAZINE, lidocaine (PF), lidocaine-prilocaine, midazolam, pentafluoroprop-tetrafluoroeth    Filed Vitals:   01/14/15 0400 01/14/15 0500 01/14/15 0600 01/14/15 0813  BP: 144/81 107/56 95/63 87/51   Pulse:   99 96  Temp: 99.9 F (37.7 C)  100.2 F (37.9 C)   TempSrc: Core (Comment)  Core (Comment)   Resp: 18 16 18 18   Height:      Weight: 293 lb 14 oz (133.3 kg)     SpO2:   99% 100%    Intake/Output Summary (Last 24 hours) at 01/14/15 0911 Last data filed at 01/14/15 0600  Gross per 24 hour  Intake 2569.08 ml  Output   3690 ml  Net -1120.92 ml    LABS: Basic Metabolic Panel:  Recent Labs  40/98/1101/20/16 0300 01/14/15 0415  NA 136 135  K 4.6 3.9  CL 100 95*  CO2 31 30  GLUCOSE 88 73  BUN 51* 29*  CREATININE 9.91* 7.42*  CALCIUM 9.0 8.9  PHOS 7.0*  --    Liver Function Tests:  Recent Labs  01/13/15 0300 01/14/15 0415  AST  --  17  ALT  --  23  ALKPHOS  --  57    BILITOT  --  0.9  PROT  --  5.8*  ALBUMIN 2.5* 2.4*   No results for input(s): LIPASE, AMYLASE in the last 72 hours. CBC:  Recent Labs  01/13/15 0300 01/14/15 0415  WBC 10.5 8.3  HGB 11.0* 11.2*  HCT 33.4* 34.0*  MCV 87.0 89.7  PLT 157 145*   Cardiac Enzymes: No results for input(s): CKTOTAL, CKMB, CKMBINDEX, TROPONINI in the last 72 hours. BNP: Invalid input(s): POCBNP D-Dimer: No results for input(s): DDIMER in the last 72 hours. Hemoglobin A1C: No results for input(s): HGBA1C in the last 72 hours. Fasting Lipid Panel: No results for input(s): CHOL, HDL, LDLCALC, TRIG, CHOLHDL, LDLDIRECT in the last 72 hours. Thyroid Function Tests: No results for input(s): TSH, T4TOTAL, T3FREE, THYROIDAB in the last 72 hours.  Invalid input(s): FREET3 Anemia Panel: No results for input(s): VITAMINB12, FOLATE, FERRITIN, TIBC, IRON, RETICCTPCT in the last 72 hours.  RADIOLOGY: Ct Head Wo Contrast  01/12/2015   CLINICAL DATA:  Cerebral anoxia following cardiac arrest.  EXAM: CT HEAD WITHOUT CONTRAST  TECHNIQUE: Contiguous axial images were obtained from the base of the skull through the vertex without intravenous contrast.  COMPARISON:  01/09/2015  FINDINGS: There is no evidence of acute cortical infarct, intracranial  hemorrhage, mass, midline shift, or extra-axial fluid collection. Gray-white differentiation is preserved without clear evidence of cerebral edema. Ventricles and sulci are unchanged in size and configuration.  Mastoid air cells are clear. Endotracheal and enteric tubes are partially visualized. Fluid is present in the left frontal sinus, multiple left ethmoid air cells, sphenoid sinuses, and nasal cavity. Orbits are unremarkable.  IMPRESSION: No acute intracranial abnormality identified.   Electronically Signed   By: Sebastian Ache   On: 01/12/2015 14:36   Ct Head Wo Contrast  01/09/2015   CLINICAL DATA:  Post code.  Question anoxic brain injury.  EXAM: CT HEAD WITHOUT CONTRAST   TECHNIQUE: Contiguous axial images were obtained from the base of the skull through the vertex without intravenous contrast.  COMPARISON:  None.  FINDINGS: No acute intracranial abnormality. Specifically, no hemorrhage, hydrocephalus, mass lesion, acute infarction, or significant intracranial injury. No acute calvarial abnormality. No current CT evidence or not brain injury.  Extensive soft tissue thickening within the nasal airway and ethmoid air cells as well as maxillary sinuses and sphenoid sinus. Air-fluid levels in the sphenoid sinuses. Mastoid air cells are clear.  IMPRESSION: No acute intracranial abnormality.   Electronically Signed   By: Charlett Nose M.D.   On: 01/09/2015 14:40   Dg Chest Port 1 View  01/14/2015   CLINICAL DATA:  54 year old hypertensive male with end-stage renal disease. Assess endotracheal tube. Subsequent encounter.  EXAM: PORTABLE CHEST - 1 VIEW  COMPARISON:  01/13/2015  FINDINGS: Rotation to the right. Right costophrenic angle not included on present exam.  Endotracheal tube tip 3.8 cm above the carina. Right central line tip proximal superior vena cava level. Nasogastric tube courses below the diaphragm. Tip is not included on the present exam.  Cardiomegaly.  Pulmonary vascular congestion/mild pulmonary edema.  Basilar consolidation greater on the left unchanged.  Tortuous aorta  IMPRESSION: Overall no significant change.  Cardiomegaly.  Pulmonary vascular congestion/ mild pulmonary edema pulmonary edema.  Basilar consolidation greater on the left unchanged.  Tortuous aorta   Electronically Signed   By: Bridgett Larsson M.D.   On: 01/14/2015 07:22   Dg Chest Port 1 View  01/13/2015   CLINICAL DATA:  Respiratory failure .  EXAM: PORTABLE CHEST - 1 VIEW  COMPARISON:  01/12/2015.  FINDINGS: Tracheostomy tube and NG tube in stable position. Right IJ line stable position. Persistent severe cardiomegaly with bilateral pulmonary alveolar infiltrates and pleural effusions consistent with  congestive heart failure.  IMPRESSION: 1. Lines and tubes in stable position. 2. Persistent severe cardiomegaly with bilateral pulmonary alveolar infiltrates and pleural effusions consistent with congestive heart failure. Superimposed pneumonia cannot be excluded. No interim change.   Electronically Signed   By: Maisie Fus  Register   On: 01/13/2015 07:38   Dg Chest Port 1 View  01/12/2015   CLINICAL DATA:  Cardiac arrest.  EXAM: PORTABLE CHEST - 1 VIEW  COMPARISON:  01/11/2015 .  FINDINGS: Lines and tubes in stable position. Cardiomegaly. Bilateral pulmonary alveolar infiltrates again noted. Small bilateral pleural effusions. No pneumothorax.  IMPRESSION: 1. Lines and tubes in stable position. 2. Persistent severe cardiomegaly. Bilateral pulmonary infiltrates most likely secondary to congestive heart failure and pulmonary edema. Small bilateral pleural effusions are present . No significant interim change.   Electronically Signed   By: Maisie Fus  Register   On: 01/12/2015 07:20   Dg Chest Port 1 View  01/11/2015   CLINICAL DATA:  Acute respiratory failure.  EXAM: PORTABLE CHEST - 1 VIEW  COMPARISON:  01/10/2015  FINDINGS: There is an endotracheal tube with tip at the clavicular heads. A gastric suction tube reaches the diaphragm. Stable positioning of right IJ central line, tip at the SVC.  Unchanged cardiomegaly.  Unchanged upper mediastinal contours.  Central pulmonary venous congestion. Increased hazy lower lung opacities suggesting layering pleural fluid. There are probable superimposed lower lung opacities. No pneumothorax.  IMPRESSION: 1. Unchanged positioning of tubes and central line. 2. Increasing or layering bilateral pleural effusions. Pulmonary venous congestion and basilar lung opacification is unchanged.   Electronically Signed   By: Tiburcio Pea M.D.   On: 01/11/2015 02:55   Dg Chest Port 1 View  01/10/2015   CLINICAL DATA:  Subsequent encounter for altered mental status. History of  hypertension.  EXAM: PORTABLE CHEST - 1 VIEW  COMPARISON:  01/09/2015  FINDINGS: Endotracheal tube difficult to visualize centrally. Nasogastric extends beyond the inferior aspect of the film. Right internal jugular line tip mid SVC.  Numerous leads and wires project over the chest. Cardiomegaly accentuated by AP portable technique. Developing left greater than right pleural effusions. No pneumothorax. Interstitial edema is moderate. Progressive.  IMPRESSION: Worsened aeration, with increased congestive heart failure, developing pleural effusions, and left greater than right bibasilar airspace disease.   Electronically Signed   By: Jeronimo Greaves M.D.   On: 01/10/2015 09:30   Dg Chest Port 1 View  01/09/2015   CLINICAL DATA:  Central line placement  EXAM: PORTABLE CHEST - 1 VIEW  COMPARISON:  01/09/2015  FINDINGS: Right internal jugular central line is in place with the tip in the SVC. No pneumothorax. Endotracheal tube and NG tube remain in place, unchanged. Cardiomegaly. Vascular congestion. No real change since prior study.  IMPRESSION: Right central line tip in the SVC.  No pneumothorax.   Electronically Signed   By: Charlett Nose M.D.   On: 01/09/2015 15:20   Dg Chest Portable 1 View  01/09/2015   CLINICAL DATA:  Cardiac arrest.  Intubated.  EXAM: PORTABLE CHEST - 1 VIEW  COMPARISON:  12/02/2013.  FINDINGS: Endotracheal tube in satisfactory position. Nasogastric tube extending into the stomach. Stable enlarged cardiac silhouette. Support apparatus obscuring portions of the chest with no gross lung infiltrates. There is a linear lucency overlying the mediastinum on the left. No definite pneumothorax seen.  IMPRESSION: Limited examination with a linear artifact or pneumomediastinum noted on the left.   Electronically Signed   By: Gordan Payment M.D.   On: 01/09/2015 14:27   Dg Abd Portable 1v  01/11/2015   CLINICAL DATA:  Ileus  EXAM: PORTABLE ABDOMEN - 1 VIEW  COMPARISON:  None.  FINDINGS: There is  nonspecific nonobstructive small bowel gas pattern. Mild colonic distension with gas and some stool probable mild colonic ileus. Some gas noted within stomach. NG tube in place. The left colon is not distended.  IMPRESSION: Nonobstructed small bowel gas pattern. Mild colonic distension with gas and stool probable mild ileus. Some gas noted within stomach. NG tube in place PICC   Electronically Signed   By: Natasha Mead M.D.   On: 01/11/2015 10:38    PHYSICAL EXAM General: Sedated on vent.  Neck: No JVD, no thyromegaly or thyroid nodule.  Lungs: Coarse breath sounds bilaterally.  CV: Nondisplaced PMI.  Heart regular S1/S2, no S3/S4, no murmur.  No peripheral edema.   Abdomen: Soft, nontender, no hepatosplenomegaly, no distention.  Neurologic: Opens eyes. Does not respond to pain for me Extremities: No clubbing or cyanosis. LUE AVF with HD connected  TELEMETRY: Reviewed telemetry pt in NSR  ASSESSMENT AND PLAN: 54 yo with history of ESRD and CAD at prior cath was admitted after cardiac arrest.  1. Cardiac arrest: VT/VF with defibrillation x 3.  Prolonged down-time.  He underwent hypothermia and is now rewarmed.  EF 55-60% on echo.  No significant change on ECG from prior.  No purposeful movement so far.  Possibly related to cocaine. Doubt true NSTEMI - If patient shows neurologic recovery, will need cardiac cath => if negative, will need EP for possible ICD.  - Continue ASA 81.  2. Anoxic encephalopathy: See above, continue to monitor for recovery.  He is now off sedation and at times withdraws from painful stimuli.  Neurology following.  3. ESRD: BP dropping with HD, off BP meds.  4. HTN: Can give prn hydralazine. 5. Acute respiratory failure: per CCM   6. Cocaine abuse. UDS + this admit 7. CAD:  cardiac catheterization after a non-STEMI in the setting of cocaine abuse in 2014 . Showed a stenosis in a diagonal branch. This was not intervened upon due to patient noncompliance with dialysis and  drug abuse. Continue ASA and statin.    Marca Ancona MD 01/14/2015 9:11 AM

## 2015-01-14 NOTE — Progress Notes (Signed)
Subjective: Patient just taken off sedation. No changes noted.    Objective: Current vital signs: BP 109/60 mmHg  Pulse 92  Temp(Src) 100.4 F (38 C) (Core (Comment))  Resp 18  Ht  (1.88 m)  Wt 133.3 kg (293 lb 14 oz)  BMI 37.72 kg/m2  SpO2 99% Vital signs in last 24 hours: Temp:  [98.1 F (36.7 C)-100.4 F (38 C)] 100.4 F (38 C) (01/21 0801) Pulse Rate:  [31-104] 92 (01/21 1000) Resp:  [16-26] 18 (01/21 1000) BP: (78-163)/(41-100) 109/60 mmHg (01/21 1000) SpO2:  [81 %-100 %] 99 % (01/21 1000) Arterial Line BP: (89-137)/(55-128) 89/55 mmHg (01/20 2000) FiO2 (%):  [40 %] 40 % (01/21 0813) Weight:  [133.3 kg (293 lb 14 oz)-138.8 kg (306 lb)] 133.3 kg (293 lb 14 oz) (01/21 0400)  Intake/Output from previous day: 01/20 0701 - 01/21 0700 In: 2806.1 [I.V.:2646.1; NG/GT:50; IV Piggyback:110] Out: 3690 [Urine:140; Emesis/NG output:750] Intake/Output this shift: Total I/O In: 399.5 [I.V.:314.5; NG/GT:30; IV Piggyback:55] Out: 300 [Emesis/NG output:300] Nutritional status:    Neurologic Exam: Mental Status: Patient does not respond to verbal stimuli. Does not respond to deep sternal rub. Does not follow commands. No verbalizations noted.  Cranial Nerves: II: patient does not respond confrontation bilaterally, pupils right 2 mm, left 2 mm,and reactive bilaterally III,IV,VI: doll's response absent bilaterally.  V,VII: corneal reflex present bilaterally  VIII: patient does not respond to verbal stimuli IX,X: gag reflex reduced, XI: trapezius strength unable to test bilaterally XII: tongue strength unable to test Motor: Extremities flaccid throughout. No spontaneous movement noted. No purposeful movements noted. Sensory: Does not respond to noxious stimuli in any extremity. Deep Tendon Reflexes:  1+ throughout. Plantars: upgoing bilaterally  Lab Results: Basic Metabolic Panel:  Recent Labs Lab 01/09/15 1359  01/10/15 0359  01/11/15 0700 01/11/15 2100  01/12/15 0345 01/13/15 0300 01/14/15 0415  NA 136  < >  --   < > 137 137 138 136 135  K 3.4*  < >  --   < > 4.4 5.1 5.0 4.6 3.9  CL 99  < >  --   < > 100 98 99 100 95*  CO2 21  < >  --   < > GLUCOSE 245*  < >  --   < > 77 80 80 88 73  BUN 45*  < >  --   < > 25* 36* 39* 51* 29*  CREATININE 8.51*  < >  --   < > 5.41* 7.81* 8.12* 9.91* 7.42*  CALCIUM 8.8  < >  --   < > 8.5 9.1 9.1 9.0 8.9  MG 2.3  --  2.3  --   --   --   --   --   --   PHOS 6.4*  --  5.1*  --   --   --   --  7.0*  --   < > = values in this interval not displayed.  Liver Function Tests:  Recent Labs Lab 01/13/15 0300 01/14/15 0415  AST  --  17  ALT  --  23  ALKPHOS  --  57  BILITOT  --  0.9  PROT  --  5.8*  ALBUMIN 2.5* 2.4*   No results for input(s): LIPASE, AMYLASE in the last 168 hours. No results for input(s): AMMONIA in the last 168 hours.  CBC:  Recent Labs Lab 01/09/15 1315  01/11/15 0230 01/12/15 0001 01/12/15 0345 01/13/15 0300 01/14/15  0415  WBC 9.1  < > 15.3* 14.2* 13.2* 10.5 8.3  NEUTROABS 5.4  --   --   --   --   --   --   HGB 11.9*  < > 13.0 11.9* 12.2* 11.0* 11.2*  HCT 36.9*  < > 38.4* 36.2* 37.2* 33.4* 34.0*  MCV 90.2  < > 87.5 88.3 90.5 87.0 89.7  PLT 175  < > 188 144* 158 157 145*  < > = values in this interval not displayed.  Cardiac Enzymes:  Recent Labs Lab 01/09/15 1359 01/10/15 0159 01/10/15 0637  TROPONINI 0.80* 0.78* 0.69*    Lipid Panel: No results for input(s): CHOL, TRIG, HDL, CHOLHDL, VLDL, LDLCALC in the last 168 hours.  CBG:  Recent Labs Lab 01/13/15 2249 01/14/15 0047 01/14/15 0114 01/14/15 0411 01/14/15 0846  GLUCAP 81 67* 99 75 81    Microbiology: Results for orders placed or performed during the hospital encounter of 01/09/15  MRSA PCR Screening     Status: None   Collection Time: 01/09/15  5:31 PM  Result Value Ref Range Status   MRSA by PCR NEGATIVE NEGATIVE Final    Comment:        The GeneXpert MRSA Assay  (FDA approved for NASAL specimens only), is one component of a comprehensive MRSA colonization surveillance program. It is not intended to diagnose MRSA infection nor to guide or monitor treatment for MRSA infections.     Coagulation Studies: No results for input(s): LABPROT, INR in the last 72 hours.  Imaging: Ct Head Wo Contrast  01/12/2015   CLINICAL DATA:  Cerebral anoxia following cardiac arrest.  EXAM: CT HEAD WITHOUT CONTRAST  TECHNIQUE: Contiguous axial images were obtained from the base of the skull through the vertex without intravenous contrast.  COMPARISON:  01/09/2015  FINDINGS: There is no evidence of acute cortical infarct, intracranial hemorrhage, mass, midline shift, or extra-axial fluid collection. Gray-white differentiation is preserved without clear evidence of cerebral edema. Ventricles and sulci are unchanged in size and configuration.  Mastoid air cells are clear. Endotracheal and enteric tubes are partially visualized. Fluid is present in the left frontal sinus, multiple left ethmoid air cells, sphenoid sinuses, and nasal cavity. Orbits are unremarkable.  IMPRESSION: No acute intracranial abnormality identified.   Electronically Signed   By: Sebastian AcheAllen  Grady   On: 01/12/2015 14:36   Dg Chest Port 1 View  01/14/2015   CLINICAL DATA:  54 year old hypertensive male with end-stage renal disease. Assess endotracheal tube. Subsequent encounter.  EXAM: PORTABLE CHEST - 1 VIEW  COMPARISON:  01/13/2015  FINDINGS: Rotation to the right. Right costophrenic angle not included on present exam.  Endotracheal tube tip 3.8 cm above the carina. Right central line tip proximal superior vena cava level. Nasogastric tube courses below the diaphragm. Tip is not included on the present exam.  Cardiomegaly.  Pulmonary vascular congestion/mild pulmonary edema.  Basilar consolidation greater on the left unchanged.  Tortuous aorta  IMPRESSION: Overall no significant change.  Cardiomegaly.  Pulmonary  vascular congestion/ mild pulmonary edema pulmonary edema.  Basilar consolidation greater on the left unchanged.  Tortuous aorta   Electronically Signed   By: Bridgett LarssonSteve  Olson M.D.   On: 01/14/2015 07:22   Dg Chest Port 1 View  01/13/2015   CLINICAL DATA:  Respiratory failure .  EXAM: PORTABLE CHEST - 1 VIEW  COMPARISON:  01/12/2015.  FINDINGS: Tracheostomy tube and NG tube in stable position. Right IJ line stable position. Persistent severe cardiomegaly with bilateral pulmonary alveolar  infiltrates and pleural effusions consistent with congestive heart failure.  IMPRESSION: 1. Lines and tubes in stable position. 2. Persistent severe cardiomegaly with bilateral pulmonary alveolar infiltrates and pleural effusions consistent with congestive heart failure. Superimposed pneumonia cannot be excluded. No interim change.   Electronically Signed   By: Maisie Fus  Register   On: 01/13/2015 07:38    Medications:  I have reviewed the patient's current medications. Scheduled: . antiseptic oral rinse  7 mL Mouth Rinse QID  . aspirin  81 mg Oral Daily  . atorvastatin  80 mg Oral q1800  . chlorhexidine  15 mL Mouth Rinse BID  . dextrose  1 ampule Intravenous Once  . doxercalciferol  14 mcg Intravenous Q M,W,F-HD  . pantoprazole sodium  40 mg Per Tube Daily  . valproate sodium  500 mg Intravenous Q12H    Assessment/Plan: Patient unchanged. Just taken off sedation-about 30 minutes, so full evaluation not yet reasonable particularly in the face of significant renal dysfunction.  VPA level 15.2.  LFT's normal.  Remains on Depakote.    Recommendations: 1.  Will re-evaluate once off sedation for longer  2.  Increase Depakote to  q12 hours.     LOS: 5 days   Thana Farr, MD [facility] [phone] 01/14/2015  10:38 AM

## 2015-01-14 NOTE — Progress Notes (Signed)
Pt rate changed from 20bpm to 18bpm per order. Pt tolerating well.

## 2015-01-14 NOTE — Progress Notes (Signed)
Patient ID: [name] [name], male   DOB: 1961-12-23, 54 y.o.   MRN: [id number]  [facility] Progress Note    Subjective:   Intubated, sedated   Objective:   BP 87/51 mmHg  Pulse 96  Temp(Src) 100.2 F (37.9 C) (Core (Comment))  Resp 18  Ht 6\' 2"  (1.88 m)  Wt 133.3 kg (293 lb 14 oz)  BMI 37.72 kg/m2  SpO2 100%  Intake/Output: I/O last 3 completed shifts: In: 3912.7 [I.V.:3752.7; NG/GT:50; IV Piggyback:110] Out: 4160 [Urine:210; Emesis/NG output:1150; Other:2800]   Intake/Output this shift:    Weight change: 4.9 kg (10 lb 12.8 oz)  Physical Exam: Gen:WD obese AAM intubated CVS:no rub Resp:cta MVH:QIONGEAbd:benign Ext:LUE AVF +T/B  Labs: BMET  Recent Labs Lab 01/09/15 1359  01/10/15 0359  01/10/15 2230 01/11/15 0230 01/11/15 0700 01/11/15 2100 01/12/15 0345 01/13/15 0300 01/14/15 0415  NA 136  < >  --   < > 138 136 137 137 138 136 135  K 3.4*  < >  --   < > 6.1* 5.7* 4.4 5.1 5.0 4.6 3.9  CL 99  < >  --   < > 104 103 100 98 99 100 95*  CO2 21  < >  --   < > 23 26 30 30 28 31 30   GLUCOSE 245*  < >  --   < > 68* 75 77 80 80 88 73  BUN 45*  < >  --   < > 58* 58* 25* 36* 39* 51* 29*  CREATININE 8.51*  < >  --   < > 9.74* 9.68* 5.41* 7.81* 8.12* 9.91* 7.42*  ALBUMIN  --   --   --   --   --   --   --   --   --  2.5* 2.4*  CALCIUM 8.8  < >  --   < > 8.9 9.2 8.5 9.1 9.1 9.0 8.9  PHOS 6.4*  --  5.1*  --   --   --   --   --   --  7.0*  --   < > = values in this interval not displayed. CBC  Recent Labs Lab 01/09/15 1315  01/12/15 0001 01/12/15 0345 01/13/15 0300 01/14/15 0415  WBC 9.1  < > 14.2* 13.2* 10.5 8.3  NEUTROABS 5.4  --   --   --   --   --   HGB 11.9*  < > 11.9* 12.2* 11.0* 11.2*  HCT 36.9*  < > 36.2* 37.2* 33.4* 34.0*  MCV 90.2  < > 88.3 90.5 87.0 89.7  PLT 175  < > 144* 158 157 145*  < > = values in this interval not displayed.  @IMGRELPRIORS @ Medications:    . antiseptic oral rinse  7 mL Mouth Rinse QID  . aspirin  81 mg Oral Daily  .  atorvastatin  80 mg Oral q1800  . chlorhexidine  15 mL Mouth Rinse BID  . dextrose  1 ampule Intravenous Once  . doxercalciferol  14 mcg Intravenous Q M,W,F-HD  . pantoprazole sodium  40 mg Per Tube Daily  . valproate sodium  500 mg Intravenous Q12H   HD: [facility]WF [facility] 4.5h 500/800 129.5kg 2/2.0 [place] Heparin 9000 LUA AVF No aranesp or Fe, Hectorol 14 ug tiw  Assessment/ Plan:    1. S/p Cardiac arrest (VT/VF) s/p defib x 3 with prolonged down time. Plan per cardiology 2. ?anoxic encphelopathy- work up underway per neuro.  Consider MRI, only having  sedation tapered today so would give him some time before deciding upon recovery. 3. VDRF- per PCCM 4. ESRD- tolerated HD well yesterday and will plan for HD tomorrow (cont MWF) 5. Anemia: stable 6. Hypoglycemia- recurrent. Will start D10 and follow 7. Nutrition:npo for now due to concern for SBO (worrisome for fecal material in OG tube) 8. Hypertension: stable 9. Polysubstance abuse- tox screen pending. 10. Dispo- awaiting to see if he starts to respond after being taken off of sedation (was still on versed and fentanyl until 8am today) 11.   Areya Lemmerman A 01/14/2015, 9:14 AM

## 2015-01-15 ENCOUNTER — Inpatient Hospital Stay (HOSPITAL_COMMUNITY): Payer: Medicare Other

## 2015-01-15 DIAGNOSIS — J9601 Acute respiratory failure with hypoxia: Secondary | ICD-10-CM | POA: Insufficient documentation

## 2015-01-15 LAB — BASIC METABOLIC PANEL
Anion gap: 14 (ref 5–15)
BUN: 40 mg/dL — ABNORMAL HIGH (ref 6–23)
CALCIUM: 9.5 mg/dL (ref 8.4–10.5)
CO2: 28 mmol/L (ref 19–32)
CREATININE: 9.68 mg/dL — AB (ref 0.50–1.35)
Chloride: 93 mEq/L — ABNORMAL LOW (ref 96–112)
GFR, EST AFRICAN AMERICAN: 6 mL/min — AB (ref >90)
GFR, EST NON AFRICAN AMERICAN: 5 mL/min — AB (ref >90)
Glucose, Bld: 88 mg/dL (ref 70–99)
Potassium: 3.9 mmol/L (ref 3.5–5.1)
SODIUM: 135 mmol/L (ref 135–145)

## 2015-01-15 LAB — CBC
HCT: 31.3 % — ABNORMAL LOW (ref 39.0–52.0)
Hemoglobin: 10.5 g/dL — ABNORMAL LOW (ref 13.0–17.0)
MCH: 28.8 pg (ref 26.0–34.0)
MCHC: 33.5 g/dL (ref 30.0–36.0)
MCV: 85.8 fL (ref 78.0–100.0)
Platelets: 148 10*3/uL — ABNORMAL LOW (ref 150–400)
RBC: 3.65 MIL/uL — AB (ref 4.22–5.81)
RDW: 14.2 % (ref 11.5–15.5)
WBC: 8.2 10*3/uL (ref 4.0–10.5)

## 2015-01-15 LAB — GLUCOSE, CAPILLARY
GLUCOSE-CAPILLARY: 69 mg/dL — AB (ref 70–99)
GLUCOSE-CAPILLARY: 78 mg/dL (ref 70–99)
GLUCOSE-CAPILLARY: 90 mg/dL (ref 70–99)
Glucose-Capillary: 99 mg/dL (ref 70–99)
Glucose-Capillary: 81 mg/dL (ref 70–99)
Glucose-Capillary: 84 mg/dL (ref 70–99)

## 2015-01-15 MED ORDER — ONDANSETRON HCL 4 MG/2ML IJ SOLN
INTRAMUSCULAR | Status: AC
  Filled 2015-01-15: qty 2

## 2015-01-15 MED ORDER — ONDANSETRON HCL 4 MG/2ML IJ SOLN
4.0000 mg | Freq: Four times a day (QID) | INTRAMUSCULAR | Status: DC | PRN
  Administered 2015-01-15 – 2015-01-20 (×7): 4 mg via INTRAVENOUS
  Filled 2015-01-15 (×6): qty 2

## 2015-01-15 MED ORDER — HEPARIN SODIUM (PORCINE) 1000 UNIT/ML DIALYSIS: 20.0000 [IU]/kg | INTRAMUSCULAR | Status: DC | PRN

## 2015-01-15 MED ORDER — DOXERCALCIFEROL 4 MCG/2ML IV SOLN
INTRAVENOUS | Status: AC
  Filled 2015-01-15: qty 8

## 2015-01-15 MED ORDER — VITAL HIGH PROTEIN PO LIQD
1000.0000 mL | ORAL | Status: DC
  Administered 2015-01-16 – 2015-01-18 (×9): 1000 mL
  Filled 2015-01-15 (×7): qty 1000

## 2015-01-15 NOTE — Progress Notes (Signed)
NUTRITION FOLLOW UP / CONSULT  Intervention:    Initiate TF via OGT with Vital High Protein at 20 ml/h, increase by 10 ml every 4 hours to goal rate of 85 ml/h with to provide 2040 kcals (24 kcals/kg), 179 gm protein, 1705 ml free water daily.  Nutrition Dx:   Inadequate oral intake related to inability to eat as evidenced by NPO status, ongoing.  Goal:   Enteral nutrition to provide 60-70% of estimated calorie needs (22-25 kcals/kg ideal body weight) and 100% of estimated protein needs, based on ASPEN guidelines for hypocaloric, high protein feeding in critically ill obese individuals  Monitor:   TF tolerance/adequacy, weight trend, labs, vent status.  Assessment:   54 yo ESRD , poorly compliant with HD MWF who noted to be slumped over at home. EMS activated and 10 minutes of CPR along with one shock applied. Intubated in the field and transported to [name] ED and his deemed a hypothermia candidate and taken to Cath lab urgently.  Received MD Consult for TF initiation and management.  Patient is currently intubated on ventilator support MV: 11 L/min Temp (24hrs), Avg:99.2 F (37.3 C), Min:97.9 F (36.6 C), Max:100.2 F (37.9 C)  Propofol: none  Height: Ht Readings from Last 1 Encounters:  01/09/15  (1.88 m)    Weight Status:   Wt Readings from Last 1 Encounters:  01/15/15 295 lb 6.7 oz (134 kg)   01/12/15 286 lb 9.6 oz (130 kg)    Re-estimated needs:  Kcal: 2583 Protein: 173-200 gm Fluid: 2.6 L  Skin: intact  Diet Order:  NPO   Intake/Output Summary (Last 24 hours) at 01/15/15 0931 Last data filed at 01/15/15 0600  Gross per 24 hour  Intake 1614.11 ml  Output    788 ml  Net 826.11 ml    Last BM: None documented since admission   Labs:   Recent Labs Lab 01/09/15 1359  01/10/15 0359  01/13/15 0300 01/14/15 0415 01/15/15 0445  NA 136  < >  --   < > 136 135 135  K 3.4*  < >  --   < > 4.6 3.9 3.9  CL 99  < >  --   < > 100 95* 93*  CO2 21  <  >  --   < > BUN 45*  < >  --   < > 51* 29* 40*  CREATININE 8.51*  < >  --   < > 9.91* 7.42* 9.68*  CALCIUM 8.8  < >  --   < > 9.0 8.9 9.5  MG 2.3  --  2.3  --   --   --   --   PHOS 6.4*  --  5.1*  --  7.0*  --   --   GLUCOSE 245*  < >  --   < > 88 73 88  < > = values in this interval not displayed.  CBG (last 3)   Recent Labs  01/14/15 2324 01/15/15 0343 01/15/15 0349  GLUCAP 102* 69* 78    Scheduled Meds: . antiseptic oral rinse  7 mL Mouth Rinse QID  . aspirin  81 mg Oral Daily  . atorvastatin  80 mg Oral q1800  . chlorhexidine  15 mL Mouth Rinse BID  . dextrose  1 ampule Intravenous Once  . doxercalciferol      . doxercalciferol  14 mcg Intravenous Q M,W,F-HD  . feeding supplement (VITAL HIGH PROTEIN)  1,000 mL  Per Tube Q24H  . pantoprazole sodium  40 mg Per Tube Daily  . valproate sodium  1,000 mg Intravenous Q12H    Continuous Infusions: . sodium chloride 10 mL/hr at 01/14/15 2326  . dextrose Stopped (01/15/15 0137)    Joaquin CourtsKimberly Eiliana Drone, RD, LDN, CNSC Pager [phone] After Hours Pager [phone]

## 2015-01-15 NOTE — Progress Notes (Signed)
PULMONARY / CRITICAL CARE MEDICINE   Name: [name] [name] MRN: [id number] DOB: 08/19/1961    ADMISSION DATE:  01/09/2015   REFERRING MD :  ED  CHIEF COMPLAINT:  VT arrest  INITIAL PRESENTATION: Intubated post arrest  PATIENT SUMMARY:   54 yo ESRD , poorly compliant with HD MWF admitted after a cardia arrest on 1/16. He had 10 minutes of CPR along with one shock applied. Intubated in the field. Hypothermia initiated on arrival and taken to Cath lab urgently.  STUDIES:  1/16 CT head>> no acute abnormalities 1/18 Abdominal xray >> nonobstructive small bowel gas pattern. ? Mild colonic ileus 1/19 repeat Head ct scan >> normal    SIGNIFICANT EVENTS: 1/16 VT/V F arrest 1/16 Hypothermia >> 1/16 CVL right IJ >> 1/16 A-line rt radial >> 1/21 1/18 HD attempted; did not tolerate it very well due to hypotension, did have some volume removal 1/18 Consulted neurology for prognostication 1/20 Updated patient's family (cousins). Sisters not present.   SUBJECTIVE:  Has not required any sedation for 24h  VITAL SIGNS: Temp:  [98.7 F (37.1 C)-100.2 F (37.9 C)] 99.5 F (37.5 C) (01/22 1230) Pulse Rate:  [34-106] 91 (01/22 1230) Resp:  [8-31] 16 (01/22 1230) BP: (74-185)/(44-109) 117/61 mmHg (01/22 1230) SpO2:  [97 %-100 %] 98 % (01/22 1230) FiO2 (%):  [40 %] 40 % (01/22 1216) Weight:  [134 kg (295 lb 6.7 oz)] 134 kg (295 lb 6.7 oz) (01/22 0500) HEMODYNAMICS: CVP:  [2 mmHg-4 mmHg] 4 mmHg VENTILATOR SETTINGS: Vent Mode:  [-] PSV;CPAP FiO2 (%):  [40 %] 40 % Set Rate:  [18 bmp] 18 bmp Vt Set:  [600 mL] 600 mL PEEP:  [5 cmH20] 5 cmH20 Pressure Support:  [5 cmH20-10 cmH20] 10 cmH20 Plateau Pressure:  [11 cmH20-13 cmH20] 12 cmH20 INTAKE / OUTPUT:  Intake/Output Summary (Last 24 hours) at 01/15/15 1244 Last data filed at 01/15/15 1200  Gross per 24 hour  Intake 1869.11 ml  Output    448 ml  Net 1421.11 ml    PHYSICAL EXAMINATION: General:  Obese AAM Neuro:  Opening eyes  spontaneously, did not track, blinks intermittently to threat, positive cough and gag and corneals HEENT: Left ij av graft Cardiovascular:  HSR, on HD Lungs:  Coarse rhonchi B Abdomen:  Obese + b s. OGT with dark appearing secretions. Nonbloody Musculoskeletal:  Intact,  Skin:  Left arm av graft x 2, protuberant pulsatile. No signs of infecttion  LABS:  CBC  Recent Labs Lab 01/13/15 0300 01/14/15 0415 01/15/15 0445  WBC 10.5 8.3 8.2  HGB 11.0* 11.2* 10.5*  HCT 33.4* 34.0* 31.3*  PLT 157 145* 148*   Coag's  Recent Labs Lab 01/09/15 1315 01/09/15 2109 01/09/15 2309  APTT 32 34 33  INR 1.19 1.24 1.23   BMET  Recent Labs Lab 01/13/15 0300 01/14/15 0415 01/15/15 0445  NA 136 135 135  K 4.6 3.9 3.9  CL 100 95* 93*  CO2 BUN 51* 29* 40*  CREATININE 9.91* 7.42* 9.68*  GLUCOSE 88 73 88   Electrolytes  Recent Labs Lab 01/09/15 1359  01/10/15 0359  01/13/15 0300 01/14/15 0415 01/15/15 0445  CALCIUM 8.8  < >  --   < > 9.0 8.9 9.5  MG 2.3  --  2.3  --   --   --   --   PHOS 6.4*  --  5.1*  --  7.0*  --   --   < > = values  in this interval not displayed. Sepsis Markers No results for input(s): LATICACIDVEN, PROCALCITON, O2SATVEN in the last 168 hours. ABG  Recent Labs Lab 01/10/15 0444 01/11/15 0243 01/11/15 1011  PHART 7.329* 7.239* 7.323*  PCO2ART 42.7 54.9* 54.9*  PO2ART 78.7* 97.0 77.0*   Liver Enzymes  Recent Labs Lab 01/13/15 0300 01/14/15 0415  AST  --  17  ALT  --  23  ALKPHOS  --  57  BILITOT  --  0.9  ALBUMIN 2.5* 2.4*   Cardiac Enzymes  Recent Labs Lab 01/09/15 1359 01/10/15 0159 01/10/15 0637  TROPONINI 0.80* 0.78* 0.69*   Glucose  Recent Labs Lab 01/14/15 1946 01/14/15 2324 01/15/15 0343 01/15/15 0349 01/15/15 0936 01/15/15 1223  GLUCAP 96 102* 69* 78 90 99    Imaging Dg Chest Port 1 View  01/14/2015   CLINICAL DATA:  54 year old hypertensive male with end-stage renal disease. Assess endotracheal tube.  Subsequent encounter.  EXAM: PORTABLE CHEST - 1 VIEW  COMPARISON:  01/13/2015  FINDINGS: Rotation to the right. Right costophrenic angle not included on present exam.  Endotracheal tube tip 3.8 cm above the carina. Right central line tip proximal superior vena cava level. Nasogastric tube courses below the diaphragm. Tip is not included on the present exam.  Cardiomegaly.  Pulmonary vascular congestion/mild pulmonary edema.  Basilar consolidation greater on the left unchanged.  Tortuous aorta  IMPRESSION: Overall no significant change.  Cardiomegaly.  Pulmonary vascular congestion/ mild pulmonary edema pulmonary edema.  Basilar consolidation greater on the left unchanged.  Tortuous aorta   Electronically Signed   By: Bridgett Larsson M.D.   On: 01/14/2015 07:22   Portable cxr 1/22 >> improving B infiltrates   ASSESSMENT / PLAN:  PULMONARY OETT 1/16 per ems>> Increased B pleural effusions ?due to increased volume > improving 1/22 CXR A: VDRF post VT arrest Hypothermia protocol P:   Continue MV pending evaluation of MS and whether he can wake up PSV OK but no plan to extubate at this time.   CARDIOVASCULAR A:  Post arrest VT/VF shocked x 3 HTN > resolved with cardene gtt Hx of CAD inferior Q waves and ST elevation inferiorly on old ECGs Mild trop elevation 0.8 >0.78>0.69 Hypotension with HD 1/20 P:  Prn levo infusion for hypotension if unable to tolerate his HD; currently normotensive Cardiac cath deferred pending neurologic improvement.   RENAL A:   ESRD HD M W F Hypokalemia evolved to hyperkalemia.  Unable to tolerate HD due to low BP P:   Appreciate Renal consult HD complicated due to low BP  Will attempt HD 1/22 No acute electrolytes abnormalities currently Monitor renal function   GASTROINTESTINAL A:   GI protection 1/18 noticed increased GI secretions, ? SBO / ileus. At some risk for bowel ischemia 1/18 Abdominal xray >> nonobstructive small bowel gas pattern. ? Mild  colonic ileus ? Red tinged secretions, minimal P:   No evidence of significant bleeding in GI tract. Will monitor clinically Cont with PIP for SUP Started TF on 1/21  HEMATOLOGIC A:   Leukocytosis >> resolved  P:  Follow cbc   INFECTIOUS A:   No clear infectious process but he does have significant purulent secretions.  Leukocytosis was likely WBC demargination No fevers since 1/18 P:   No abx for now; will cover for HCAP / asp PNA if he spikes temp, CXR evolves Follow clinically and Cx if fever returns.  ENDOCRINE A:   Hypoglycemia P:   Iv dextrose as needed. On d10  now, will decrease rate 1/22. Hopefully will improve with addition of TFs Monitor cbg q4h.   NEUROLOGIC A:   Significant anoxia, presumed brain injury Post arrest non responsive, but exam complicated by sedation Head ct scan 1/16 and 1/19 both nl  P:   RASS goal: -1 to 0 1/18 Consulted neurology to assist with prognosis. 1/19 EEG indicating moderate to severe cerebral dysfunction (encephalopathy).   Sedation lightened 1/21 May merit an MRI for prognostication, especially if sedation holiday is difficult  Family: Updated sister 1/19 evening. Cousin updated 1/21 by Dr Delton CoombesByrum.   Independent critical care time is 35 minutes.   Levy Pupaobert Johnathan Tortorelli, MD, PhD 01/15/2015, 12:44 PM [facility] [phone] or if no answer [phone]

## 2015-01-15 NOTE — Progress Notes (Signed)
I wasted 200ml Fentanyl and 25ml Versed in sink. Second RN witness, Adrian ProwsSara Burnham.

## 2015-01-15 NOTE — Procedures (Signed)
Patient was seen on dialysis and the procedure was supervised. BFR 400 Via LUE AVF BP is 178/80.  Patient appears to be tolerating treatment well.  He is off sedation for 24 hours and will open his eyes to voice but not yet with purposeful movement, continue to follow.

## 2015-01-15 NOTE — Progress Notes (Signed)
Subjective: Patient with eyes open.  Still not following commands.  Remains intubated.    Objective: Current vital signs: BP 134/69 mmHg  Pulse 96  Temp(Src) 98.7 F (37.1 C) (Oral)  Resp 23  Ht  (1.88 m)  Wt 134 kg (295 lb 6.7 oz)  BMI 37.91 kg/m2  SpO2 100% Vital signs in last 24 hours: Temp:  [97.9 F (36.6 C)-100.2 F (37.9 C)] 98.7 F (37.1 C) (01/22 0800) Pulse Rate:  [34-106] 96 (01/22 0900) Resp:  [8-31] 23 (01/22 0900) BP: (63-185)/(21-109) 134/69 mmHg (01/22 0900) SpO2:  [91 %-100 %] 100 % (01/22 0900) FiO2 (%):  [40 %] 40 % (01/22 0900) Weight:  [134 kg (295 lb 6.7 oz)] 134 kg (295 lb 6.7 oz) (01/22 0500)  Intake/Output from previous day: 01/21 0701 - 01/22 0700 In: 1938.6 [I.V.:1565.9; NG/GT:257.7; IV Piggyback:115] Out: 1088 [Urine:38; Emesis/NG output:1050] Intake/Output this shift:   Nutritional status:    Neurologic Exam: Mental Status: Eyes open.  Does not fix on anything in the room.  Patient does not respond to verbal stimuli. Does not respond to deep sternal rub. Does not follow commands. No verbalizations noted.  Cranial Nerves: II: patient does not respond confrontation bilaterally, pupils right 2 mm, left 2 mm,and reactive bilaterally III,IV,VI: doll's response present bilaterally.  V,VII: corneal reflex present bilaterally  VIII: patient does not respond to verbal stimuli IX,X: gag reflex reduced, XI: trapezius strength unable to test bilaterally XII: tongue strength unable to test Motor: Extremities flaccid throughout. Spontaneous movement noted of both lower extremities. No purposeful movements noted. Sensory: Seemed to respond to noxious stimuli in the LLE. Deep Tendon Reflexes:  1+ throughout. Plantars: upgoing bilaterally  Lab Results: Basic Metabolic Panel:  Recent Labs Lab 01/09/15 1359  01/10/15 0359  01/11/15 2100 01/12/15 0345 01/13/15 0300 01/14/15 0415 01/15/15 0445  NA 136  < >  --   < > 137 138 136 135  135  K 3.4*  < >  --   < > 5.1 5.0 4.6 3.9 3.9  CL 99  < >  --   < > 98 99 100 95* 93*  CO2 21  < >  --   < > GLUCOSE 245*  < >  --   < > 80 80 88 73 88  BUN 45*  < >  --   < > 36* 39* 51* 29* 40*  CREATININE 8.51*  < >  --   < > 7.81* 8.12* 9.91* 7.42* 9.68*  CALCIUM 8.8  < >  --   < > 9.1 9.1 9.0 8.9 9.5  MG 2.3  --  2.3  --   --   --   --   --   --   PHOS 6.4*  --  5.1*  --   --   --  7.0*  --   --   < > = values in this interval not displayed.  Liver Function Tests:  Recent Labs Lab 01/13/15 0300 01/14/15 0415  AST  --  17  ALT  --  23  ALKPHOS  --  57  BILITOT  --  0.9  PROT  --  5.8*  ALBUMIN 2.5* 2.4*   No results for input(s): LIPASE, AMYLASE in the last 168 hours. No results for input(s): AMMONIA in the last 168 hours.  CBC:  Recent Labs Lab 01/09/15 1315  01/12/15 0001 01/12/15 0345 01/13/15 0300 01/14/15 0415 01/15/15 0445  WBC  9.1  < > 14.2* 13.2* 10.5 8.3 8.2  NEUTROABS 5.4  --   --   --   --   --   --   HGB 11.9*  < > 11.9* 12.2* 11.0* 11.2* 10.5*  HCT 36.9*  < > 36.2* 37.2* 33.4* 34.0* 31.3*  MCV 90.2  < > 88.3 90.5 87.0 89.7 85.8  PLT 175  < > 144* 158 157 145* 148*  < > = values in this interval not displayed.  Cardiac Enzymes:  Recent Labs Lab 01/09/15 1359 01/10/15 0159 01/10/15 0637  TROPONINI 0.80* 0.78* 0.69*    Lipid Panel: No results for input(s): CHOL, TRIG, HDL, CHOLHDL, VLDL, LDLCALC in the last 168 hours.  CBG:  Recent Labs Lab 01/14/15 1630 01/14/15 1946 01/14/15 2324 01/15/15 0343 01/15/15 0349  GLUCAP 89 96 102* 69* 78    Microbiology: Results for orders placed or performed during the hospital encounter of 01/09/15  MRSA PCR Screening     Status: None   Collection Time: 01/09/15  5:31 PM  Result Value Ref Range Status   MRSA by PCR NEGATIVE NEGATIVE Final    Comment:        The GeneXpert MRSA Assay (FDA approved for NASAL specimens only), is one component of a comprehensive MRSA  colonization surveillance program. It is not intended to diagnose MRSA infection nor to guide or monitor treatment for MRSA infections.     Coagulation Studies: No results for input(s): LABPROT, INR in the last 72 hours.  Imaging: Dg Chest Port 1 View  01/15/2015   CLINICAL DATA:  Respiratory failure  EXAM: PORTABLE CHEST - 1 VIEW  COMPARISON:  01/14/2015  FINDINGS: There is an endotracheal tube with tip just below the clavicular heads. A gastric suction tube reaches the stomach. Right IJ catheter, tip at the SVC level.  Improved aeration with the lower lungs better inflated. Pulmonary edema is resolved and pleural effusions or atelectasis have decreased.  IMPRESSION: 1. Stable positioning of tubes and central line. 2. Improved pulmonary inflation and clearing pulmonary edema.   Electronically Signed   By: Tiburcio Pea M.D.   On: 01/15/2015 04:41   Dg Chest Port 1 View  01/14/2015   CLINICAL DATA:  54 year old hypertensive male with end-stage renal disease. Assess endotracheal tube. Subsequent encounter.  EXAM: PORTABLE CHEST - 1 VIEW  COMPARISON:  01/13/2015  FINDINGS: Rotation to the right. Right costophrenic angle not included on present exam.  Endotracheal tube tip 3.8 cm above the carina. Right central line tip proximal superior vena cava level. Nasogastric tube courses below the diaphragm. Tip is not included on the present exam.  Cardiomegaly.  Pulmonary vascular congestion/mild pulmonary edema.  Basilar consolidation greater on the left unchanged.  Tortuous aorta  IMPRESSION: Overall no significant change.  Cardiomegaly.  Pulmonary vascular congestion/ mild pulmonary edema pulmonary edema.  Basilar consolidation greater on the left unchanged.  Tortuous aorta   Electronically Signed   By: Bridgett Larsson M.D.   On: 01/14/2015 07:22    Medications:  I have reviewed the patient's current medications. Scheduled: . antiseptic oral rinse  7 mL Mouth Rinse QID  . aspirin  81 mg Oral Daily   . atorvastatin  80 mg Oral q1800  . chlorhexidine  15 mL Mouth Rinse BID  . dextrose  1 ampule Intravenous Once  . doxercalciferol      . doxercalciferol  14 mcg Intravenous Q M,W,F-HD  . feeding supplement (VITAL HIGH PROTEIN)  1,000 mL Per Tube  Q24H  . pantoprazole sodium  40 mg Per Tube Daily  . valproate sodium  1,000 mg Intravenous Q12H    Assessment/Plan: Patient somewhat improved.  Still not at baseline.  Remains intubated.  On Valproate.  Dose increased on yesterday.    Recommendations: 1.  Depakote level in AM 2.  Continue Depakote at current dose.    LOS: 6 days   Thana FarrLeslie Barnaby Rippeon, MD [facility] [phone] 01/15/2015  9:11 AM

## 2015-01-15 NOTE — Progress Notes (Signed)
Notifed Dr. Sung AmabileSimonds pt. Vomited large amount of tan tube feed color emesis.  Will hold tube feeds for a while and resume per Dr. Sung AmabileSimonds.  Will continue to assess for changes.

## 2015-01-15 NOTE — Progress Notes (Signed)
Patient ID: [name] [name], male   DOB: [birth year], 54 y.o.   MRN: [id number]   SUBJECTIVE:   Opens eyes. But doesn't follow commands. On HD.   Scheduled Meds: . antiseptic oral rinse  7 mL Mouth Rinse QID  . aspirin  81 mg Oral Daily  . atorvastatin  80 mg Oral q1800  . chlorhexidine  15 mL Mouth Rinse BID  . dextrose  1 ampule Intravenous Once  . doxercalciferol      . doxercalciferol  14 mcg Intravenous Q M,W,F-HD  . feeding supplement (VITAL HIGH PROTEIN)  1,000 mL Per Tube Q24H  . pantoprazole sodium  40 mg Per Tube Daily  . valproate sodium  1,000 mg Intravenous Q12H   Continuous Infusions: . sodium chloride 10 mL/hr at 01/14/15 2326  . dextrose Stopped (01/15/15 0137)   PRN Meds:.feeding supplement (NEPRO CARB STEADY), fentaNYL, heparin, hydrALAZINE    Filed Vitals:   01/15/15 0800 01/15/15 0822 01/15/15 0827 01/15/15 0900  BP: 162/94 152/89 146/103 134/69  Pulse: 93 93 97 96  Temp: 98.7 F (37.1 C)     TempSrc: Oral     Resp: Height:      Weight:      SpO2: 99% 98% 100% 100%    Intake/Output Summary (Last 24 hours) at 01/15/15 0918 Last data filed at 01/15/15 0600  Gross per 24 hour  Intake 1699.11 ml  Output    788 ml  Net 911.11 ml    LABS: Basic Metabolic Panel:  Recent Labs  40/98/11 0300 01/14/15 0415 01/15/15 0445  NA 136 135 135  K 4.6 3.9 3.9  CL 100 95* 93*  CO2 GLUCOSE 88 73 88  BUN 51* 29* 40*  CREATININE 9.91* 7.42* 9.68*  CALCIUM 9.0 8.9 9.5  PHOS 7.0*  --   --    Liver Function Tests:  Recent Labs  01/13/15 0300 01/14/15 0415  AST  --  17  ALT  --  23  ALKPHOS  --  57  BILITOT  --  0.9  PROT  --  5.8*  ALBUMIN 2.5* 2.4*   No results for input(s): LIPASE, AMYLASE in the last 72 hours. CBC:  Recent Labs  01/14/15 0415 01/15/15 0445  WBC 8.3 8.2  HGB 11.2* 10.5*  HCT 34.0* 31.3*  MCV 89.7 85.8  PLT 145* 148*   Cardiac Enzymes: No results for input(s): CKTOTAL, CKMB, CKMBINDEX,  TROPONINI in the last 72 hours. BNP: Invalid input(s): POCBNP D-Dimer: No results for input(s): DDIMER in the last 72 hours. Hemoglobin A1C: No results for input(s): HGBA1C in the last 72 hours. Fasting Lipid Panel: No results for input(s): CHOL, HDL, LDLCALC, TRIG, CHOLHDL, LDLDIRECT in the last 72 hours. Thyroid Function Tests: No results for input(s): TSH, T4TOTAL, T3FREE, THYROIDAB in the last 72 hours.  Invalid input(s): FREET3 Anemia Panel: No results for input(s): VITAMINB12, FOLATE, FERRITIN, TIBC, IRON, RETICCTPCT in the last 72 hours.  RADIOLOGY: Ct Head Wo Contrast  01/12/2015   CLINICAL DATA:  Cerebral anoxia following cardiac arrest.  EXAM: CT HEAD WITHOUT CONTRAST  TECHNIQUE: Contiguous axial images were obtained from the base of the skull through the vertex without intravenous contrast.  COMPARISON:  01/09/2015  FINDINGS: There is no evidence of acute cortical infarct, intracranial hemorrhage, mass, midline shift, or extra-axial fluid collection. Gray-white differentiation is preserved without clear evidence of cerebral edema. Ventricles and sulci are unchanged in size and configuration.  Mastoid air cells  are clear. Endotracheal and enteric tubes are partially visualized. Fluid is present in the left frontal sinus, multiple left ethmoid air cells, sphenoid sinuses, and nasal cavity. Orbits are unremarkable.  IMPRESSION: No acute intracranial abnormality identified.   Electronically Signed   By: Sebastian Ache   On: 01/12/2015 14:36   Ct Head Wo Contrast  01/09/2015   CLINICAL DATA:  Post code.  Question anoxic brain injury.  EXAM: CT HEAD WITHOUT CONTRAST  TECHNIQUE: Contiguous axial images were obtained from the base of the skull through the vertex without intravenous contrast.  COMPARISON:  None.  FINDINGS: No acute intracranial abnormality. Specifically, no hemorrhage, hydrocephalus, mass lesion, acute infarction, or significant intracranial injury. No acute calvarial  abnormality. No current CT evidence or not brain injury.  Extensive soft tissue thickening within the nasal airway and ethmoid air cells as well as maxillary sinuses and sphenoid sinus. Air-fluid levels in the sphenoid sinuses. Mastoid air cells are clear.  IMPRESSION: No acute intracranial abnormality.   Electronically Signed   By: Charlett Nose M.D.   On: 01/09/2015 14:40   Dg Chest Port 1 View  01/15/2015   CLINICAL DATA:  Respiratory failure  EXAM: PORTABLE CHEST - 1 VIEW  COMPARISON:  01/14/2015  FINDINGS: There is an endotracheal tube with tip just below the clavicular heads. A gastric suction tube reaches the stomach. Right IJ catheter, tip at the SVC level.  Improved aeration with the lower lungs better inflated. Pulmonary edema is resolved and pleural effusions or atelectasis have decreased.  IMPRESSION: 1. Stable positioning of tubes and central line. 2. Improved pulmonary inflation and clearing pulmonary edema.   Electronically Signed   By: Tiburcio Pea M.D.   On: 01/15/2015 04:41   Dg Chest Port 1 View  01/14/2015   CLINICAL DATA:  54 year old hypertensive male with end-stage renal disease. Assess endotracheal tube. Subsequent encounter.  EXAM: PORTABLE CHEST - 1 VIEW  COMPARISON:  01/13/2015  FINDINGS: Rotation to the right. Right costophrenic angle not included on present exam.  Endotracheal tube tip 3.8 cm above the carina. Right central line tip proximal superior vena cava level. Nasogastric tube courses below the diaphragm. Tip is not included on the present exam.  Cardiomegaly.  Pulmonary vascular congestion/mild pulmonary edema.  Basilar consolidation greater on the left unchanged.  Tortuous aorta  IMPRESSION: Overall no significant change.  Cardiomegaly.  Pulmonary vascular congestion/ mild pulmonary edema pulmonary edema.  Basilar consolidation greater on the left unchanged.  Tortuous aorta   Electronically Signed   By: Bridgett Larsson M.D.   On: 01/14/2015 07:22   Dg Chest Port 1  View  01/13/2015   CLINICAL DATA:  Respiratory failure .  EXAM: PORTABLE CHEST - 1 VIEW  COMPARISON:  01/12/2015.  FINDINGS: Tracheostomy tube and NG tube in stable position. Right IJ line stable position. Persistent severe cardiomegaly with bilateral pulmonary alveolar infiltrates and pleural effusions consistent with congestive heart failure.  IMPRESSION: 1. Lines and tubes in stable position. 2. Persistent severe cardiomegaly with bilateral pulmonary alveolar infiltrates and pleural effusions consistent with congestive heart failure. Superimposed pneumonia cannot be excluded. No interim change.   Electronically Signed   By: Maisie Fus  Register   On: 01/13/2015 07:38   Dg Chest Port 1 View  01/12/2015   CLINICAL DATA:  Cardiac arrest.  EXAM: PORTABLE CHEST - 1 VIEW  COMPARISON:  01/11/2015 .  FINDINGS: Lines and tubes in stable position. Cardiomegaly. Bilateral pulmonary alveolar infiltrates again noted. Small bilateral pleural effusions. No pneumothorax.  IMPRESSION: 1. Lines and tubes in stable position. 2. Persistent severe cardiomegaly. Bilateral pulmonary infiltrates most likely secondary to congestive heart failure and pulmonary edema. Small bilateral pleural effusions are present . No significant interim change.   Electronically Signed   By: Maisie Fus  Register   On: 01/12/2015 07:20   Dg Chest Port 1 View  01/11/2015   CLINICAL DATA:  Acute respiratory failure.  EXAM: PORTABLE CHEST - 1 VIEW  COMPARISON:  01/10/2015  FINDINGS: There is an endotracheal tube with tip at the clavicular heads. A gastric suction tube reaches the diaphragm. Stable positioning of right IJ central line, tip at the SVC.  Unchanged cardiomegaly.  Unchanged upper mediastinal contours.  Central pulmonary venous congestion. Increased hazy lower lung opacities suggesting layering pleural fluid. There are probable superimposed lower lung opacities. No pneumothorax.  IMPRESSION: 1. Unchanged positioning of tubes and central line. 2.  Increasing or layering bilateral pleural effusions. Pulmonary venous congestion and basilar lung opacification is unchanged.   Electronically Signed   By: Tiburcio Pea M.D.   On: 01/11/2015 02:55   Dg Chest Port 1 View  01/10/2015   CLINICAL DATA:  Subsequent encounter for altered mental status. History of hypertension.  EXAM: PORTABLE CHEST - 1 VIEW  COMPARISON:  01/09/2015  FINDINGS: Endotracheal tube difficult to visualize centrally. Nasogastric extends beyond the inferior aspect of the film. Right internal jugular line tip mid SVC.  Numerous leads and wires project over the chest. Cardiomegaly accentuated by AP portable technique. Developing left greater than right pleural effusions. No pneumothorax. Interstitial edema is moderate. Progressive.  IMPRESSION: Worsened aeration, with increased congestive heart failure, developing pleural effusions, and left greater than right bibasilar airspace disease.   Electronically Signed   By: Jeronimo Greaves M.D.   On: 01/10/2015 09:30   Dg Chest Port 1 View  01/09/2015   CLINICAL DATA:  Central line placement  EXAM: PORTABLE CHEST - 1 VIEW  COMPARISON:  01/09/2015  FINDINGS: Right internal jugular central line is in place with the tip in the SVC. No pneumothorax. Endotracheal tube and NG tube remain in place, unchanged. Cardiomegaly. Vascular congestion. No real change since prior study.  IMPRESSION: Right central line tip in the SVC.  No pneumothorax.   Electronically Signed   By: Charlett Nose M.D.   On: 01/09/2015 15:20   Dg Chest Portable 1 View  01/09/2015   CLINICAL DATA:  Cardiac arrest.  Intubated.  EXAM: PORTABLE CHEST - 1 VIEW  COMPARISON:  12/02/2013.  FINDINGS: Endotracheal tube in satisfactory position. Nasogastric tube extending into the stomach. Stable enlarged cardiac silhouette. Support apparatus obscuring portions of the chest with no gross lung infiltrates. There is a linear lucency overlying the mediastinum on the left. No definite pneumothorax  seen.  IMPRESSION: Limited examination with a linear artifact or pneumomediastinum noted on the left.   Electronically Signed   By: Gordan Payment M.D.   On: 01/09/2015 14:27   Dg Abd Portable 1v  01/11/2015   CLINICAL DATA:  Ileus  EXAM: PORTABLE ABDOMEN - 1 VIEW  COMPARISON:  None.  FINDINGS: There is nonspecific nonobstructive small bowel gas pattern. Mild colonic distension with gas and some stool probable mild colonic ileus. Some gas noted within stomach. NG tube in place. The left colon is not distended.  IMPRESSION: Nonobstructed small bowel gas pattern. Mild colonic distension with gas and stool probable mild ileus. Some gas noted within stomach. NG tube in place PICC   Electronically Signed   By: Lang Snow  Pop M.D.   On: 01/11/2015 10:38    PHYSICAL EXAM General: Sedated on vent.  Neck: No JVD, no thyromegaly or thyroid nodule.  Lungs: Coarse breath sounds bilaterally.  CV: Nondisplaced PMI.  Heart regular S1/S2, no S3/S4, no murmur.  No peripheral edema.   Abdomen: Soft, nontender, no hepatosplenomegaly, no distention.  Neurologic: Opens eyes. Does not respond to pain for me Extremities: No clubbing or cyanosis. LUE AVF with HD connected  TELEMETRY: Reviewed telemetry pt in NSR  ASSESSMENT AND PLAN: 54 yo with history of ESRD and CAD at prior cath was admitted after cardiac arrest.  1. Cardiac arrest: VT/VF with defibrillation x 3.  Prolonged down-time.  He underwent hypothermia and is now rewarmed.  EF 55-60% on echo.  No significant change on ECG from prior.  No purposeful movement so far.  Possibly related to cocaine. Doubt true NSTEMI - If patient shows neurologic recovery, will need cardiac cath +/- ICD - Continue ASA 81.  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 ESRD: BP dropping with HD, off BP meds.  4. HTN: Can give prn hydralazine. 5. Acute respiratory failure: per CCM   6. Cocaine abuse. UDS + this admit 7. CAD:  cardiac catheterization after a non-STEMI in  the setting of cocaine abuse in 2014 . Showed a stenosis in a diagonal branch. This was not intervened upon due to patient noncompliance with dialysis and drug abuse. Continue ASA and statin.   Hemodynamically stable. Seems to have significant anoxic brain injury. Cardiology will follow from a distance. If has meaningful recovery can consider cath +/- ICD at that point. We will see again Monday. Please call sooner if we can help .    Arvilla Meresaniel Zacharia Sowles MD 01/15/2015 9:18 AM

## 2015-01-15 NOTE — Progress Notes (Signed)
Pt tube was pushed out to 25cm at the lip. RT advanced tube back to 28cm where it suppose to be. The tube holder is saturated with secretion and is unable to hold the ET tube secure. RT changed tube holder with no complications. Tube is now secure and 28cm at the lip. RN aware.

## 2015-01-16 DIAGNOSIS — G931 Anoxic brain damage, not elsewhere classified: Secondary | ICD-10-CM

## 2015-01-16 LAB — GLUCOSE, CAPILLARY
GLUCOSE-CAPILLARY: 72 mg/dL (ref 70–99)
GLUCOSE-CAPILLARY: 81 mg/dL (ref 70–99)
Glucose-Capillary: 75 mg/dL (ref 70–99)
Glucose-Capillary: 74 mg/dL (ref 70–99)
Glucose-Capillary: 66 mg/dL — ABNORMAL LOW (ref 70–99)
Glucose-Capillary: 84 mg/dL (ref 70–99)
Glucose-Capillary: 78 mg/dL (ref 70–99)

## 2015-01-16 LAB — CBC
HEMATOCRIT: 31.7 % — AB (ref 39.0–52.0)
Hemoglobin: 10.4 g/dL — ABNORMAL LOW (ref 13.0–17.0)
MCH: 29 pg (ref 26.0–34.0)
MCHC: 32.8 g/dL (ref 30.0–36.0)
MCV: 88.3 fL (ref 78.0–100.0)
Platelets: 161 10*3/uL (ref 150–400)
RBC: 3.59 MIL/uL — ABNORMAL LOW (ref 4.22–5.81)
RDW: 14.5 % (ref 11.5–15.5)
WBC: 8.8 10*3/uL (ref 4.0–10.5)

## 2015-01-16 LAB — BASIC METABOLIC PANEL
Anion gap: 15 (ref 5–15)
BUN: 36 mg/dL — ABNORMAL HIGH (ref 6–23)
CO2: 27 mmol/L (ref 19–32)
Calcium: 10 mg/dL (ref 8.4–10.5)
Chloride: 97 mmol/L (ref 96–112)
Creatinine, Ser: 8.06 mg/dL — ABNORMAL HIGH (ref 0.50–1.35)
GFR calc Af Amer: 8 mL/min — ABNORMAL LOW (ref >90)
GFR, EST NON AFRICAN AMERICAN: 7 mL/min — AB (ref >90)
GLUCOSE: 83 mg/dL (ref 70–99)
Potassium: 4.6 mmol/L (ref 3.5–5.1)
Sodium: 139 mmol/L (ref 135–145)

## 2015-01-16 LAB — PHOSPHORUS: Phosphorus: 8.4 mg/dL — ABNORMAL HIGH (ref 2.3–4.6)

## 2015-01-16 LAB — VALPROIC ACID LEVEL: VALPROIC ACID LVL: 51.8 ug/mL (ref 50.0–100.0)

## 2015-01-16 LAB — MAGNESIUM: MAGNESIUM: 2.3 mg/dL (ref 1.5–2.5)

## 2015-01-16 MED ORDER — DEXTROSE 50 % IV SOLN
INTRAVENOUS | Status: AC
  Administered 2015-01-16: 25 mL
  Filled 2015-01-16: qty 50

## 2015-01-16 NOTE — Progress Notes (Signed)
Subjective: Patient appears improved today. Following some commands.  Remains on Depakote.  Objective: Current vital signs: BP 127/69 mmHg  Pulse 91  Temp(Src) 99.1 F (37.3 C) (Oral)  Resp 18  Ht  (1.88 m)  Wt 134 kg (295 lb 6.7 oz)  BMI 37.91 kg/m2  SpO2 95% Vital signs in last 24 hours: Temp:  [99.1 F (37.3 C)-101.5 F (38.6 C)] 99.1 F (37.3 C) (01/23 0600) Pulse Rate:  [86-106] 91 (01/23 0600) Resp:  [11-27] 18 (01/23 0322) BP: (74-169)/(44-102) 127/69 mmHg (01/23 0600) SpO2:  [94 %-100 %] 95 % (01/23 0600) FiO2 (%):  [40 %] 40 % (01/23 0856)  Intake/Output from previous day: 01/22 0701 - 01/23 0700 In: 1016.8 [I.V.:220; NG/GT:676.8; IV Piggyback:120] Out: 2469 [Urine:15] Intake/Output this shift:   Nutritional status:    Neurologic Exam: Mental Status: Eyes open. Does not fix on anything in the room. Follows simple commands.   Cranial Nerves: II: patient does not respond confrontation bilaterally, pupils right 2 mm, left 2 mm,and reactive bilaterally III,IV,VI: doll's response present bilaterally.  V,VII: corneal reflex present bilaterally  VIII: patient does not respond to verbal stimuli IX,X: gag reflex reduced, XI: trapezius strength unable to test bilaterally XII: tongue strength unable to test Motor: Squeezes both hands to commands.  No purposeful movement noted of the lower extremities.  Some spontaneous movement noted.   Sensory: Responds to light stimuli in the upper extremities Deep Tendon Reflexes:  1+ throughout. Plantars: upgoing bilaterally  Lab Results: Basic Metabolic Panel:  Recent Labs Lab 01/09/15 1359  01/10/15 0359  01/12/15 0345 01/13/15 0300 01/14/15 0415 01/15/15 0445 01/16/15 0445  NA 136  < >  --   < > 138 136 135 135 139  K 3.4*  < >  --   < > 5.0 4.6 3.9 3.9 4.6  CL 99  < >  --   < > 99 100 95* 93* 97  CO2 21  < >  --   < > GLUCOSE 245*  < >  --   < > 80 88 73 88 83  BUN 45*  < >  --   < >  39* 51* 29* 40* 36*  CREATININE 8.51*  < >  --   < > 8.12* 9.91* 7.42* 9.68* 8.06*  CALCIUM 8.8  < >  --   < > 9.1 9.0 8.9 9.5 10.0  MG 2.3  --  2.3  --   --   --   --   --  2.3  PHOS 6.4*  --  5.1*  --   --  7.0*  --   --  8.4*  < > = values in this interval not displayed.  Liver Function Tests:  Recent Labs Lab 01/13/15 0300 01/14/15 0415  AST  --  17  ALT  --  23  ALKPHOS  --  57  BILITOT  --  0.9  PROT  --  5.8*  ALBUMIN 2.5* 2.4*   No results for input(s): LIPASE, AMYLASE in the last 168 hours. No results for input(s): AMMONIA in the last 168 hours.  CBC:  Recent Labs Lab 01/09/15 1315  01/12/15 0345 01/13/15 0300 01/14/15 0415 01/15/15 0445 01/16/15 0445  WBC 9.1  < > 13.2* 10.5 8.3 8.2 8.8  NEUTROABS 5.4  --   --   --   --   --   --   HGB 11.9*  < > 12.2* 11.0*  11.2* 10.5* 10.4*  HCT 36.9*  < > 37.2* 33.4* 34.0* 31.3* 31.7*  MCV 90.2  < > 90.5 87.0 89.7 85.8 88.3  PLT 175  < > 158 157 145* 148* 161  < > = values in this interval not displayed.  Cardiac Enzymes:  Recent Labs Lab 01/09/15 1359 01/10/15 0159 01/10/15 0637  TROPONINI 0.80* 0.78* 0.69*    Lipid Panel: No results for input(s): CHOL, TRIG, HDL, CHOLHDL, VLDL, LDLCALC in the last 168 hours.  CBG:  Recent Labs Lab 01/15/15 1743 01/15/15 2010 01/15/15 2353 01/16/15 0443 01/16/15 0803  GLUCAP 81 84 72 75 74    Microbiology: Results for orders placed or performed during the hospital encounter of 01/09/15  MRSA PCR Screening     Status: None   Collection Time: 01/09/15  5:31 PM  Result Value Ref Range Status   MRSA by PCR NEGATIVE NEGATIVE Final    Comment:        The GeneXpert MRSA Assay (FDA approved for NASAL specimens only), is one component of a comprehensive MRSA colonization surveillance program. It is not intended to diagnose MRSA infection nor to guide or monitor treatment for MRSA infections.     Coagulation Studies: No results for input(s): LABPROT, INR in the  last 72 hours.  Imaging: Dg Chest Port 1 View  01/15/2015   CLINICAL DATA:  Respiratory failure  EXAM: PORTABLE CHEST - 1 VIEW  COMPARISON:  01/14/2015  FINDINGS: There is an endotracheal tube with tip just below the clavicular heads. A gastric suction tube reaches the stomach. Right IJ catheter, tip at the SVC level.  Improved aeration with the lower lungs better inflated. Pulmonary edema is resolved and pleural effusions or atelectasis have decreased.  IMPRESSION: 1. Stable positioning of tubes and central line. 2. Improved pulmonary inflation and clearing pulmonary edema.   Electronically Signed   By: Tiburcio PeaJonathan  Watts M.D.   On: 01/15/2015 04:41    Medications:  I have reviewed the patient's current medications. Scheduled: . antiseptic oral rinse  7 mL Mouth Rinse QID  . aspirin  81 mg Oral Daily  . atorvastatin  80 mg Oral q1800  . chlorhexidine  15 mL Mouth Rinse BID  . dextrose  1 ampule Intravenous Once  . doxercalciferol  14 mcg Intravenous Q M,W,F-HD  . feeding supplement (VITAL HIGH PROTEIN)  1,000 mL Per Tube Q24H  . pantoprazole sodium  40 mg Per Tube Daily  . valproate sodium  1,000 mg Intravenous Q12H    Assessment/Plan: Patient slowly improving.  Remains on Depakote.  Depakote level 51.8 and therapeutic.    Recommendations: 1.  Continue Depakote at current dose. 2.  Will continue to follow with you.   LOS: 7 days   Thana FarrLeslie Ashanna Heinsohn, MD [facility] [phone] 01/16/2015  9:55 AM

## 2015-01-16 NOTE — Progress Notes (Signed)
eLink Physician-Brief Progress Note Patient Name: [name] [name] DOB: Apr 04, 1961 MRN: [id number]   Date of Service  01/16/2015  HPI/Events of Note  Secretions copious, turning more purulent, no fever  eICU Interventions  Cultures sent     Intervention Category Major Interventions: Infection - evaluation and management  Sandrea HughsMichael Minas Bonser 01/16/2015, 3:48 PM

## 2015-01-16 NOTE — Progress Notes (Signed)
  [facility] Progress Note   Subjective: more responsive today per staff  Filed Vitals:   01/16/15 0322 01/16/15 0400 01/16/15 0500 01/16/15 0600  BP: 109/63 88/57 133/80 127/69  Pulse: 87 87 88 91  Temp:  99.7 F (37.6 C) 99.1 F (37.3 C) 99.1 F (37.3 C)  TempSrc:      Resp: 18     Height:      Weight:      SpO2: 99% 95% 97% 95%   Exam: Obese AAM intubated, eyes open, not following commands, coughing No jvd Chest clear ant and lat RRR no MRG Abd soft, obese, NTND, +BS LUA AVF +bruit No LE or UE edema  HD: MWF [facility] 4.5h 500/800 129.5kg 2/2.0 [place] Heparin 9000 LUA AVF No aranesp or Fe, Hectorol 14 ug tiw       Assessment: 1. VT/VF arrest 2. Anoxic encephalopathy - gradually improving 3. VDRF 4. Nutrition - tube feeds 5. ESRD MWF HD 6. HTN/ vol - no meds here, BP's ok, no vol excess  Plan- next HD Monday    Vinson Moselleob Eulala Newcombe MD  pager [phone]    cell [phone]  01/16/2015, 9:32 AM     Recent Labs Lab 01/10/15 0359  01/13/15 0300 01/14/15 0415 01/15/15 0445 01/16/15 0445  NA  --   < > 136 135 135 139  K  --   < > 4.6 3.9 3.9 4.6  CL  --   < > 100 95* 93* 97  CO2  --   < > 31 30 28 27   GLUCOSE  --   < > 88 73 88 83  BUN  --   < > 51* 29* 40* 36*  CREATININE  --   < > 9.91* 7.42* 9.68* 8.06*  CALCIUM  --   < > 9.0 8.9 9.5 10.0  PHOS 5.1*  --  7.0*  --   --  8.4*  < > = values in this interval not displayed.  Recent Labs Lab 01/13/15 0300 01/14/15 0415  AST  --  17  ALT  --  23  ALKPHOS  --  57  BILITOT  --  0.9  PROT  --  5.8*  ALBUMIN 2.5* 2.4*    Recent Labs Lab 01/09/15 1315  01/14/15 0415 01/15/15 0445 01/16/15 0445  WBC 9.1  < > 8.3 8.2 8.8  NEUTROABS 5.4  --   --   --   --   HGB 11.9*  < > 11.2* 10.5* 10.4*  HCT 36.9*  < > 34.0* 31.3* 31.7*  MCV 90.2  < > 89.7 85.8 88.3  PLT 175  < > 145* 148* 161  < > = values in this interval not displayed. Marland Kitchen. antiseptic oral rinse  7 mL Mouth Rinse QID  . aspirin   81 mg Oral Daily  . atorvastatin  80 mg Oral q1800  . chlorhexidine  15 mL Mouth Rinse BID  . dextrose  1 ampule Intravenous Once  . doxercalciferol  14 mcg Intravenous Q M,W,F-HD  . feeding supplement (VITAL HIGH PROTEIN)  1,000 mL Per Tube Q24H  . pantoprazole sodium  40 mg Per Tube Daily  . valproate sodium  1,000 mg Intravenous Q12H   . sodium chloride 10 mL/hr at 01/15/15 0700  . dextrose Stopped (01/15/15 0137)   feeding supplement (NEPRO CARB STEADY), fentaNYL, heparin, hydrALAZINE, ondansetron (ZOFRAN) IV

## 2015-01-16 NOTE — Progress Notes (Signed)
Patient ID: [name] [name], male   DOB: [birth year], 54 y.o.   MRN: [id number]   SUBJECTIVE:   Had dialysis yesterday  Nurse indicates he is responding and seems to be interactive today   Scheduled Meds: . antiseptic oral rinse  7 mL Mouth Rinse QID  . aspirin  81 mg Oral Daily  . atorvastatin  80 mg Oral q1800  . chlorhexidine  15 mL Mouth Rinse BID  . dextrose  1 ampule Intravenous Once  . doxercalciferol  14 mcg Intravenous Q M,W,F-HD  . feeding supplement (VITAL HIGH PROTEIN)  1,000 mL Per Tube Q24H  . pantoprazole sodium  40 mg Per Tube Daily  . valproate sodium  1,000 mg Intravenous Q12H   Continuous Infusions: . sodium chloride 10 mL/hr at 01/15/15 0700  . dextrose Stopped (01/15/15 0137)   PRN Meds:.feeding supplement (NEPRO CARB STEADY), fentaNYL, heparin, hydrALAZINE, ondansetron (ZOFRAN) IV    Filed Vitals:   01/16/15 0322 01/16/15 0400 01/16/15 0500 01/16/15 0600  BP: 109/63 88/57 133/80 127/69  Pulse: 87 87 88 91  Temp:  99.7 F (37.6 C) 99.1 F (37.3 C) 99.1 F (37.3 C)  TempSrc:      Resp: 18     Height:      Weight:      SpO2: 99% 95% 97% 95%    Intake/Output Summary (Last 24 hours) at 01/16/15 0848 Last data filed at 01/16/15 0600  Gross per 24 hour  Intake 936.83 ml  Output   2469 ml  Net -1532.17 ml    LABS: Basic Metabolic Panel:  Recent Labs  40/98/11 0445 01/16/15 0445  NA 135 139  K 3.9 4.6  CL 93* 97  CO2 28 27  GLUCOSE 88 83  BUN 40* 36*  CREATININE 9.68* 8.06*  CALCIUM 9.5 10.0  MG  --  2.3  PHOS  --  8.4*   Liver Function Tests:  Recent Labs  01/14/15 0415  AST 17  ALT 23  ALKPHOS 57  BILITOT 0.9  PROT 5.8*  ALBUMIN 2.4*   CBC:  Recent Labs  01/15/15 0445 01/16/15 0445  WBC 8.2 8.8  HGB 10.5* 10.4*  HCT 31.3* 31.7*  MCV 85.8 88.3  PLT 148* 161    RADIOLOGY: Ct Head Wo Contrast  01/12/2015   CLINICAL DATA:  Cerebral anoxia following cardiac arrest.  EXAM: CT HEAD WITHOUT CONTRAST  TECHNIQUE:  Contiguous axial images were obtained from the base of the skull through the vertex without intravenous contrast.  COMPARISON:  01/09/2015  FINDINGS: There is no evidence of acute cortical infarct, intracranial hemorrhage, mass, midline shift, or extra-axial fluid collection. Gray-white differentiation is preserved without clear evidence of cerebral edema. Ventricles and sulci are unchanged in size and configuration.  Mastoid air cells are clear. Endotracheal and enteric tubes are partially visualized. Fluid is present in the left frontal sinus, multiple left ethmoid air cells, sphenoid sinuses, and nasal cavity. Orbits are unremarkable.  IMPRESSION: No acute intracranial abnormality identified.   Electronically Signed   By: Sebastian Ache   On: 01/12/2015 14:36   Ct Head Wo Contrast  01/09/2015   CLINICAL DATA:  Post code.  Question anoxic brain injury.  EXAM: CT HEAD WITHOUT CONTRAST  TECHNIQUE: Contiguous axial images were obtained from the base of the skull through the vertex without intravenous contrast.  COMPARISON:  None.  FINDINGS: No acute intracranial abnormality. Specifically, no hemorrhage, hydrocephalus, mass lesion, acute infarction, or significant intracranial injury. No acute calvarial abnormality. No current  CT evidence or not brain injury.  Extensive soft tissue thickening within the nasal airway and ethmoid air cells as well as maxillary sinuses and sphenoid sinus. Air-fluid levels in the sphenoid sinuses. Mastoid air cells are clear.  IMPRESSION: No acute intracranial abnormality.   Electronically Signed   By: Charlett NoseKevin  Dover M.D.   On: 01/09/2015 14:40   Dg Chest Port 1 View  01/15/2015   CLINICAL DATA:  Respiratory failure  EXAM: PORTABLE CHEST - 1 VIEW  COMPARISON:  01/14/2015  FINDINGS: There is an endotracheal tube with tip just below the clavicular heads. A gastric suction tube reaches the stomach. Right IJ catheter, tip at the SVC level.  Improved aeration with the lower lungs better  inflated. Pulmonary edema is resolved and pleural effusions or atelectasis have decreased.  IMPRESSION: 1. Stable positioning of tubes and central line. 2. Improved pulmonary inflation and clearing pulmonary edema.   Electronically Signed   By: Tiburcio PeaJonathan  Watts M.D.   On: 01/15/2015 04:41   Dg Chest Port 1 View  01/14/2015   CLINICAL DATA:  54 year old hypertensive male with end-stage renal disease. Assess endotracheal tube. Subsequent encounter.  EXAM: PORTABLE CHEST - 1 VIEW  COMPARISON:  01/13/2015  FINDINGS: Rotation to the right. Right costophrenic angle not included on present exam.  Endotracheal tube tip 3.8 cm above the carina. Right central line tip proximal superior vena cava level. Nasogastric tube courses below the diaphragm. Tip is not included on the present exam.  Cardiomegaly.  Pulmonary vascular congestion/mild pulmonary edema.  Basilar consolidation greater on the left unchanged.  Tortuous aorta  IMPRESSION: Overall no significant change.  Cardiomegaly.  Pulmonary vascular congestion/ mild pulmonary edema pulmonary edema.  Basilar consolidation greater on the left unchanged.  Tortuous aorta   Electronically Signed   By: Bridgett LarssonSteve  Olson M.D.   On: 01/14/2015 07:22   Dg Chest Port 1 View  01/13/2015   CLINICAL DATA:  Respiratory failure .  EXAM: PORTABLE CHEST - 1 VIEW  COMPARISON:  01/12/2015.  FINDINGS: Tracheostomy tube and NG tube in stable position. Right IJ line stable position. Persistent severe cardiomegaly with bilateral pulmonary alveolar infiltrates and pleural effusions consistent with congestive heart failure.  IMPRESSION: 1. Lines and tubes in stable position. 2. Persistent severe cardiomegaly with bilateral pulmonary alveolar infiltrates and pleural effusions consistent with congestive heart failure. Superimposed pneumonia cannot be excluded. No interim change.   Electronically Signed   By: Maisie Fushomas  Register   On: 01/13/2015 07:38   Dg Chest Port 1 View  01/12/2015   CLINICAL DATA:   Cardiac arrest.  EXAM: PORTABLE CHEST - 1 VIEW  COMPARISON:  01/11/2015 .  FINDINGS: Lines and tubes in stable position. Cardiomegaly. Bilateral pulmonary alveolar infiltrates again noted. Small bilateral pleural effusions. No pneumothorax.  IMPRESSION: 1. Lines and tubes in stable position. 2. Persistent severe cardiomegaly. Bilateral pulmonary infiltrates most likely secondary to congestive heart failure and pulmonary edema. Small bilateral pleural effusions are present . No significant interim change.   Electronically Signed   By: Maisie Fushomas  Register   On: 01/12/2015 07:20   Dg Chest Port 1 View  01/11/2015   CLINICAL DATA:  Acute respiratory failure.  EXAM: PORTABLE CHEST - 1 VIEW  COMPARISON:  01/10/2015  FINDINGS: There is an endotracheal tube with tip at the clavicular heads. A gastric suction tube reaches the diaphragm. Stable positioning of right IJ central line, tip at the SVC.  Unchanged cardiomegaly.  Unchanged upper mediastinal contours.  Central pulmonary venous congestion. Increased  hazy lower lung opacities suggesting layering pleural fluid. There are probable superimposed lower lung opacities. No pneumothorax.  IMPRESSION: 1. Unchanged positioning of tubes and central line. 2. Increasing or layering bilateral pleural effusions. Pulmonary venous congestion and basilar lung opacification is unchanged.   Electronically Signed   By: Tiburcio Pea M.D.   On: 01/11/2015 02:55   Dg Chest Port 1 View  01/10/2015   CLINICAL DATA:  Subsequent encounter for altered mental status. History of hypertension.  EXAM: PORTABLE CHEST - 1 VIEW  COMPARISON:  01/09/2015  FINDINGS: Endotracheal tube difficult to visualize centrally. Nasogastric extends beyond the inferior aspect of the film. Right internal jugular line tip mid SVC.  Numerous leads and wires project over the chest. Cardiomegaly accentuated by AP portable technique. Developing left greater than right pleural effusions. No pneumothorax. Interstitial  edema is moderate. Progressive.  IMPRESSION: Worsened aeration, with increased congestive heart failure, developing pleural effusions, and left greater than right bibasilar airspace disease.   Electronically Signed   By: Jeronimo Greaves M.D.   On: 01/10/2015 09:30   Dg Chest Port 1 View  01/09/2015   CLINICAL DATA:  Central line placement  EXAM: PORTABLE CHEST - 1 VIEW  COMPARISON:  01/09/2015  FINDINGS: Right internal jugular central line is in place with the tip in the SVC. No pneumothorax. Endotracheal tube and NG tube remain in place, unchanged. Cardiomegaly. Vascular congestion. No real change since prior study.  IMPRESSION: Right central line tip in the SVC.  No pneumothorax.   Electronically Signed   By: Charlett Nose M.D.   On: 01/09/2015 15:20   Dg Chest Portable 1 View  01/09/2015   CLINICAL DATA:  Cardiac arrest.  Intubated.  EXAM: PORTABLE CHEST - 1 VIEW  COMPARISON:  12/02/2013.  FINDINGS: Endotracheal tube in satisfactory position. Nasogastric tube extending into the stomach. Stable enlarged cardiac silhouette. Support apparatus obscuring portions of the chest with no gross lung infiltrates. There is a linear lucency overlying the mediastinum on the left. No definite pneumothorax seen.  IMPRESSION: Limited examination with a linear artifact or pneumomediastinum noted on the left.   Electronically Signed   By: Gordan Payment M.D.   On: 01/09/2015 14:27   Dg Abd Portable 1v  01/11/2015   CLINICAL DATA:  Ileus  EXAM: PORTABLE ABDOMEN - 1 VIEW  COMPARISON:  None.  FINDINGS: There is nonspecific nonobstructive small bowel gas pattern. Mild colonic distension with gas and some stool probable mild colonic ileus. Some gas noted within stomach. NG tube in place. The left colon is not distended.  IMPRESSION: Nonobstructed small bowel gas pattern. Mild colonic distension with gas and stool probable mild ileus. Some gas noted within stomach. NG tube in place PICC   Electronically Signed   By: Natasha Mead M.D.    On: 01/11/2015 10:38    PHYSICAL EXAM General: Sedated on vent.  Neck: No JVD, no thyromegaly or thyroid nodule.  Lungs: Coarse breath sounds bilaterally.  CV: Nondisplaced PMI.  Heart regular S1/S2, no S3/S4, no murmur.  No peripheral edema.   Abdomen: Soft, nontender, no hepatosplenomegaly, no distention.  Neurologic: Opens eyes. Does not respond to pain for me Extremities: No clubbing or cyanosis. LUE AVF large and tortuous   TELEMETRY: Reviewed telemetry pt in NSR  ASSESSMENT AND PLAN: 54 yo with history of ESRD and CAD at prior cath was admitted after cardiac arrest.  1. Cardiac arrest: VT/VF with defibrillation x 3.  Prolonged down-time.  He underwent hypothermia and is  now rewarmed.  EF 55-60% on echo.  No significant change on ECG from prior.  No purposeful movement so far.  Possibly related to cocaine. Doubt true NSTEMI - If patient shows neurologic recovery, will need cardiac cath +/- ICD - Continue ASA 81.  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 ESRD: BP dropping with HD, off BP meds.  4. HTN: Can give prn hydralazine. 5. Acute respiratory failure: per CCM   6. Cocaine abuse. UDS + this admit 7. CAD:  cardiac catheterization after a non-STEMI in the setting of cocaine abuse in 2014 . Showed a stenosis in a diagonal branch. This was not intervened upon due to patient noncompliance with dialysis and drug abuse. Continue ASA and statin.   Hemodynamically stable. Seems to have significant anoxic brain injury.  If has meaningful recovery can consider cath +/- ICD at that point.    Charlton Haws MD 01/16/2015 8:48 AM

## 2015-01-16 NOTE — Progress Notes (Signed)
PULMONARY / CRITICAL CARE MEDICINE   Name: [name] [name] MRN: [id number] DOB: 08/26/1961    ADMISSION DATE:  01/09/2015   REFERRING MD :  ED  CHIEF COMPLAINT:  VT arrest  INITIAL PRESENTATION: Intubated post arrest  PATIENT SUMMARY:   54 yo ESRD , poorly compliant with HD MWF admitted after a cardia arrest on 1/16. He had 10 minutes of CPR along with one shock applied. Intubated in the field. Hypothermia initiated on arrival and taken to Cath lab urgently.  STUDIES:  1/16 CT head>> no acute abnormalities 1/18 Abdominal xray >> nonobstructive small bowel gas pattern. ? Mild colonic ileus 1/19 repeat Head ct scan >> normal    SIGNIFICANT EVENTS: 1/16 VT/V F arrest 1/16 Hypothermia >> 1/16 CVL right IJ >> 1/16 A-line rt radial >> 1/21 1/18 HD attempted; did not tolerate it very well due to hypotension, did have some volume removal 1/18 Consulted neurology for prognostication 1/20 Updated patient's family (cousins). Sisters not present.   SUBJECTIVE:  Has not required any sedation for 48h  VITAL SIGNS: Temp:  [99.1 F (37.3 C)-101.5 F (38.6 C)] 99.1 F (37.3 C) (01/23 0600) Pulse Rate:  [86-106] 91 (01/23 0600) Resp:  [11-27] 18 (01/23 0322) BP: (74-170)/(44-103) 127/69 mmHg (01/23 0600) SpO2:  [94 %-100 %] 95 % (01/23 0600) FiO2 (%):  [40 %] 40 % (01/23 0323) HEMODYNAMICS: CVP:  [4 mmHg-6 mmHg] 6 mmHg VENTILATOR SETTINGS: Vent Mode:  [-] PRVC FiO2 (%):  [40 %] 40 % Set Rate:  [18 bmp] 18 bmp Vt Set:  [600 mL] 600 mL PEEP:  [5 cmH20] 5 cmH20 Pressure Support:  [5 cmH20-10 cmH20] 10 cmH20 Plateau Pressure:  [15 cmH20-17 cmH20] 16 cmH20 INTAKE / OUTPUT:  Intake/Output Summary (Last 24 hours) at 01/16/15 0801 Last data filed at 01/16/15 0600  Gross per 24 hour  Intake 936.83 ml  Output   2469 ml  Net -1532.17 ml    PHYSICAL EXAMINATION: General:  Obese AAM Neuro:  Opening eyes spontaneously and to voice this am, did not track, blinks intermittently to  threat, positive cough and gag and corneals; nodded his head to command this am 1/23 !! HEENT: Left ij av graft Cardiovascular:  HSR, on HD Lungs:  Coarse rhonchi B Abdomen:  Obese + b s. OGT with dark appearing secretions. Nonbloody Musculoskeletal:  Intact,  Skin:  Left arm av graft x 2, protuberant pulsatile. No signs of infecttion  LABS:  CBC  Recent Labs Lab 01/14/15 0415 01/15/15 0445 01/16/15 0445  WBC 8.3 8.2 8.8  HGB 11.2* 10.5* 10.4*  HCT 34.0* 31.3* 31.7*  PLT 145* 148* 161   Coag's  Recent Labs Lab 01/09/15 1315 01/09/15 2109 01/09/15 2309  APTT 32 34 33  INR 1.19 1.24 1.23   BMET  Recent Labs Lab 01/14/15 0415 01/15/15 0445 01/16/15 0445  NA 135 135 139  K 3.9 3.9 4.6  CL 95* 93* 97  CO2 BUN 29* 40* 36*  CREATININE 7.42* 9.68* 8.06*  GLUCOSE 73 88 83   Electrolytes  Recent Labs Lab 01/09/15 1359  01/10/15 0359  01/13/15 0300 01/14/15 0415 01/15/15 0445 01/16/15 0445  CALCIUM 8.8  < >  --   < > 9.0 8.9 9.5 10.0  MG 2.3  --  2.3  --   --   --   --  2.3  PHOS 6.4*  --  5.1*  --  7.0*  --   --  8.4*  < > =  values in this interval not displayed. Sepsis Markers No results for input(s): LATICACIDVEN, PROCALCITON, O2SATVEN in the last 168 hours. ABG  Recent Labs Lab 01/10/15 0444 01/11/15 0243 01/11/15 1011  PHART 7.329* 7.239* 7.323*  PCO2ART 42.7 54.9* 54.9*  PO2ART 78.7* 97.0 77.0*   Liver Enzymes  Recent Labs Lab 01/13/15 0300 01/14/15 0415  AST  --  17  ALT  --  23  ALKPHOS  --  57  BILITOT  --  0.9  ALBUMIN 2.5* 2.4*   Cardiac Enzymes  Recent Labs Lab 01/09/15 1359 01/10/15 0159 01/10/15 0637  TROPONINI 0.80* 0.78* 0.69*   Glucose  Recent Labs Lab 01/15/15 0936 01/15/15 1223 01/15/15 1743 01/15/15 2010 01/15/15 2353 01/16/15 0443  GLUCAP 90 99 81 84 72 75    Imaging Dg Chest Port 1 View  01/15/2015   CLINICAL DATA:  Respiratory failure  EXAM: PORTABLE CHEST - 1 VIEW  COMPARISON:   01/14/2015  FINDINGS: There is an endotracheal tube with tip just below the clavicular heads. A gastric suction tube reaches the stomach. Right IJ catheter, tip at the SVC level.  Improved aeration with the lower lungs better inflated. Pulmonary edema is resolved and pleural effusions or atelectasis have decreased.  IMPRESSION: 1. Stable positioning of tubes and central line. 2. Improved pulmonary inflation and clearing pulmonary edema.   Electronically Signed   By: Tiburcio Pea M.D.   On: 01/15/2015 04:41   Portable cxr 1/22 >> improving B infiltrates   ASSESSMENT / PLAN:  PULMONARY OETT 1/16 per ems>> Increased B pleural effusions ?due to increased volume > improving 1/22 CXR A: VDRF post VT arrest Hypothermia protocol P:   Continue MV pending evaluation of MS and whether he can wake up; neuro status appears to be slowly improved PSV OK but no plan to extubate at this time.   CARDIOVASCULAR A:  Post arrest VT/VF shocked x 3 HTN > resolved with cardene gtt Hx of CAD inferior Q waves and ST elevation inferiorly on old ECGs Mild trop elevation 0.8 >0.78>0.69 Hypotension with HD 1/20 P:  Prn levo infusion for hypotension if unable to tolerate his HD; currently normotensive Cardiac cath deferred pending neurologic improvement.  ASA, lipitor,   RENAL A:   ESRD HD M W F Hypokalemia evolved to hyperkalemia.  Unable to tolerate HD due to low BP P:   Appreciate Renal consult HD complicated due to low BP but he has tolerated No acute electrolytes abnormalities currently Monitor renal function   GASTROINTESTINAL A:   GI protection 1/18 noticed increased GI secretions, ? SBO / ileus. At some risk for bowel ischemia 1/18 Abdominal xray >> nonobstructive small bowel gas pattern. ? Mild colonic ileus ? Red tinged secretions, minimal P:   No evidence of significant bleeding in GI tract. Will monitor clinically Cont with PIP for SUP TF held 1/22 pm due to significant emesis, will  not restart until output decreases Consider KUB  HEMATOLOGIC A:   Leukocytosis >> resolved  P:  Follow cbc   INFECTIOUS A:   No clear infectious process but he does have significant purulent secretions.  Leukocytosis was likely WBC demargination No fevers since 1/18 P:   No abx for now; will cover for HCAP / asp PNA if he spikes temp, CXR evolves Follow clinically and Cx if fever returns.  ENDOCRINE CBG (last 3)   Recent Labs  01/15/15 2010 01/15/15 2353 01/16/15 0443  GLUCAP 84 72 75   A:   Hypoglycemia P:  Iv dextrose as needed. On d10 now, decreased rate 1/22. Hopefully will improve when he can tolerate TFs Monitor cbg q4h.   NEUROLOGIC A:   Significant anoxia, presumed brain injury but some slow interval improvement Post arrest non responsive, but exam complicated by sedation Head ct scan 1/16 and 1/19 both nl  P:   RASS goal: -1 to 0 1/18 Consulted neurology to assist with prognosis. 1/19 EEG indicating moderate to severe cerebral dysfunction (encephalopathy).   Sedation lightened 1/21 Will continue to follow neuro exam off sedation, seems to have some slow improvement but not clear where the plateau will be May merit an MRI for prognostication, especially if sedation holiday is difficult  Family: Updated sister 1/19 evening. Cousin updated 1/21 by Dr Delton CoombesByrum. Will plan to revisit his progress and decide about duration of support based on his exam 1/25  Independent critical care time is 35 minutes.   Levy Pupaobert Raeshawn Vo, MD, PhD 01/16/2015, 8:01 AM  [facility] [phone] or if no answer [phone]

## 2015-01-17 ENCOUNTER — Inpatient Hospital Stay (HOSPITAL_COMMUNITY): Payer: Medicare Other

## 2015-01-17 LAB — GLUCOSE, CAPILLARY
GLUCOSE-CAPILLARY: 83 mg/dL (ref 70–99)
GLUCOSE-CAPILLARY: 107 mg/dL — AB (ref 70–99)
Glucose-Capillary: 68 mg/dL — ABNORMAL LOW (ref 70–99)
Glucose-Capillary: 79 mg/dL (ref 70–99)
Glucose-Capillary: 80 mg/dL (ref 70–99)
Glucose-Capillary: 86 mg/dL (ref 70–99)
Glucose-Capillary: 89 mg/dL (ref 70–99)
Glucose-Capillary: 99 mg/dL (ref 70–99)

## 2015-01-17 LAB — BASIC METABOLIC PANEL
Anion gap: 14 (ref 5–15)
BUN: 69 mg/dL — ABNORMAL HIGH (ref 6–23)
CO2: 30 mmol/L (ref 19–32)
CREATININE: 10.51 mg/dL — AB (ref 0.50–1.35)
Calcium: 10.6 mg/dL — ABNORMAL HIGH (ref 8.4–10.5)
Chloride: 97 mmol/L (ref 96–112)
GFR calc Af Amer: 6 mL/min — ABNORMAL LOW (ref >90)
GFR calc non Af Amer: 5 mL/min — ABNORMAL LOW (ref >90)
Glucose, Bld: 98 mg/dL (ref 70–99)
POTASSIUM: 4.5 mmol/L (ref 3.5–5.1)
Sodium: 141 mmol/L (ref 135–145)

## 2015-01-17 LAB — CBC
HCT: 31 % — ABNORMAL LOW (ref 39.0–52.0)
Hemoglobin: 9.9 g/dL — ABNORMAL LOW (ref 13.0–17.0)
MCH: 28.4 pg (ref 26.0–34.0)
MCHC: 31.9 g/dL (ref 30.0–36.0)
MCV: 88.8 fL (ref 78.0–100.0)
PLATELETS: 171 10*3/uL (ref 150–400)
RBC: 3.49 MIL/uL — ABNORMAL LOW (ref 4.22–5.81)
RDW: 14.5 % (ref 11.5–15.5)
WBC: 8.6 10*3/uL (ref 4.0–10.5)

## 2015-01-17 MED ORDER — PIPERACILLIN-TAZOBACTAM IN DEX 2-0.25 GM/50ML IV SOLN
2.2500 g | Freq: Three times a day (TID) | INTRAVENOUS | Status: DC
  Administered 2015-01-17 – 2015-01-18 (×4): 2.25 g via INTRAVENOUS
  Filled 2015-01-17 (×6): qty 50

## 2015-01-17 MED ORDER — METOCLOPRAMIDE HCL 5 MG/5ML PO SOLN
5.0000 mg | Freq: Four times a day (QID) | ORAL | Status: DC
  Administered 2015-01-17 – 2015-01-19 (×9): 5 mg
  Filled 2015-01-17 (×13): qty 5

## 2015-01-17 MED ORDER — DEXTROSE 50 % IV SOLN
INTRAVENOUS | Status: AC
  Filled 2015-01-17: qty 50

## 2015-01-17 MED ORDER — DEXTROSE 50 % IV SOLN
25.0000 mL | Freq: Once | INTRAVENOUS | Status: AC
Start: 2015-01-17 — End: 2015-01-17
  Administered 2015-01-17: 25 mL via INTRAVENOUS

## 2015-01-17 NOTE — Progress Notes (Signed)
PULMONARY / CRITICAL CARE MEDICINE   Name: [name] [name] MRN: [id number] DOB: [birth year]    ADMISSION DATE:  01/09/2015   REFERRING MD :  ED  CHIEF COMPLAINT:  VT arrest  INITIAL PRESENTATION: Intubated post arrest  PATIENT SUMMARY:   54 yo ESRD , poorly compliant with HD MWF admitted after a cardia arrest on 1/16. He had 10 minutes of CPR along with one shock applied. Intubated in the field. Hypothermia initiated on arrival and taken to Cath lab urgently.  STUDIES:  1/16 CT head>> no acute abnormalities 1/18 Abdominal xray >> nonobstructive small bowel gas pattern. ? Mild colonic ileus 1/19 repeat Head ct scan >> normal    SIGNIFICANT EVENTS: 1/16 VT/V F arrest 1/16 Hypothermia >> 1/16 CVL right IJ >> 1/16 A-line rt radial >> 1/21 1/18 HD attempted; did not tolerate it very well due to hypotension, did have some volume removal 1/18 Consulted neurology for prognostication 1/20 Updated patient's family (cousins). Sisters not present.   SUBJECTIVE:  Significant secretions noted, cx sent 1/23 pm Tolerates PSV  VITAL SIGNS: Temp:  [98.2 F (36.8 C)-99.3 F (37.4 C)] 98.9 F (37.2 C) (01/24 0400) Pulse Rate:  [81-95] 81 (01/24 0700) Resp:  [16-21] 21 (01/24 0032) BP: (88-178)/(44-97) 131/69 mmHg (01/24 0700) SpO2:  [91 %-100 %] 93 % (01/24 0700) FiO2 (%):  [40 %] 40 % (01/24 0700) Weight:  [129.7 kg (285 lb 15 oz)] 129.7 kg (285 lb 15 oz) (01/24 0300) HEMODYNAMICS: CVP:  [4 mmHg-8 mmHg] 8 mmHg VENTILATOR SETTINGS: Vent Mode:  [-] PRVC FiO2 (%):  [40 %] 40 % Set Rate:  [18 bmp] 18 bmp Vt Set:  [600 mL] 600 mL PEEP:  [5 cmH20] 5 cmH20 Pressure Support:  [5 cmH20-10 cmH20] 10 cmH20 Plateau Pressure:  [14 cmH20-17 cmH20] 15 cmH20 INTAKE / OUTPUT:  Intake/Output Summary (Last 24 hours) at 01/17/15 0820 Last data filed at 01/17/15 0600  Gross per 24 hour  Intake 1687.42 ml  Output      0 ml  Net 1687.42 ml    PHYSICAL EXAMINATION: General:  Obese AAM Neuro:   Opening eyes spontaneously and to voice this am, nodded to questions, followed commands although slowly, weak volitional cough HEENT: Left ij av graft Cardiovascular:  RRR, no M Lungs:  Coarse rhonchi B Abdomen:  Obese + b s.  Musculoskeletal:  Intact,  Skin:  Left arm av graft x 2, protuberant pulsatile. No signs of infecttion  LABS:  CBC  Recent Labs Lab 01/15/15 0445 01/16/15 0445 01/17/15 0400  WBC 8.2 8.8 8.6  HGB 10.5* 10.4* 9.9*  HCT 31.3* 31.7* 31.0*  PLT 148* 161 171   Coag's No results for input(s): APTT, INR in the last 168 hours. BMET  Recent Labs Lab 01/15/15 0445 01/16/15 0445 01/17/15 0400  NA 135 139 141  K 3.9 4.6 4.5  CL 93* 97 97  CO2 BUN 40* 36* 69*  CREATININE 9.68* 8.06* 10.51*  GLUCOSE 88 83 98   Electrolytes  Recent Labs Lab 01/13/15 0300  01/15/15 0445 01/16/15 0445 01/17/15 0400  CALCIUM 9.0  < > 9.5 10.0 10.6*  MG  --   --   --  2.3  --   PHOS 7.0*  --   --  8.4*  --   < > = values in this interval not displayed. Sepsis Markers No results for input(s): LATICACIDVEN, PROCALCITON, O2SATVEN in the last 168 hours. ABG  Recent Labs Lab 01/11/15 0243 01/11/15  1011  PHART 7.239* 7.323*  PCO2ART 54.9* 54.9*  PO2ART 97.0 77.0*   Liver Enzymes  Recent Labs Lab 01/13/15 0300 01/14/15 0415  AST  --  17  ALT  --  23  ALKPHOS  --  57  BILITOT  --  0.9  ALBUMIN 2.5* 2.4*   Cardiac Enzymes No results for input(s): TROPONINI, PROBNP in the last 168 hours. Glucose  Recent Labs Lab 01/16/15 0803 01/16/15 1149 01/16/15 1635 01/16/15 1804 01/16/15 2009 01/17/15 0726  GLUCAP 74 81 66* 84 78 83    Imaging No results found. Portable cxr 1/22 >> improving B infiltrates   ASSESSMENT / PLAN:  PULMONARY OETT 1/16 per ems>> A: VDRF post VT arrest Hypothermia protocol completed P:   Continue MV pending evaluation of MS and whether he can wake up; neuro status appears to be slowly improved PSV OK but no plan  to extubate at this time. ? Whether he will be a trach candidate. Will need to review progress with his family 1/25  CARDIOVASCULAR A:  Post arrest VT/VF shocked x 3 Initial HTN > resolved with cardene gtt Hx of CAD inferior Q waves and ST elevation on old ECGs Mild trop elevation 0.8 >0.78>0.69 Hypotension with HD 1/20 P:  Prn levo infusion for hypotension if unable to tolerate his HD; currently normotensive Cardiac cath deferred pending neurologic improvement.  ASA, lipitor,   RENAL A:   ESRD HD M W F Hypokalemia evolved to hyperkalemia.  Some difficulty tolerating HD due to low BP P:   Appreciate Renal consult HD complicated due to low BP, but he has tolerated No acute electrolytes abnormalities currently Monitor renal function   GASTROINTESTINAL A:   GI protection 1/18 noticed increased GI secretions, ? SBO / ileus. At some risk for bowel ischemia 1/18 Abdominal xray >> nonobstructive small bowel gas pattern. ? Mild colonic ileus ? Red tinged secretions, minimal P:   No evidence of significant bleeding in GI tract. Will monitor clinically Cont with PPI for SUP TF held 1/22 pm due to significant emesis, will not restart until output decreases; Consider KUB  HEMATOLOGIC A:   Leukocytosis >> resolved  P:  Follow cbc   INFECTIOUS A:   No clear infectious process but he does have significant purulent secretions.  Leukocytosis was likely WBC demargination No fevers since 1/18 resp 1/23 >> GNR, GPC, GNC >>  P:   Zosyn 1/24>>   Start Zosyn, suspected tracheobronchitis 1/24 Follow cx data on sputum 1/23 and narrow as speciated  ENDOCRINE CBG (last 3)   Recent Labs  01/16/15 1804 01/16/15 2009 01/17/15 0726  GLUCAP 84 78 83   A:   Hypoglycemia P:   Iv dextrose as needed. On d10 now, decreased rate 1/22. Hopefully will improve when he can tolerate TFs Monitor cbg q4h.   NEUROLOGIC A:   Significant anoxia, presumed brain injury but some slow interval  improvement Post arrest non responsive, but exam complicated by sedation Head ct scan 1/16 and 1/19 both nl  P:   RASS goal: 0 1/18 Consulted neurology to assist with prognosis. 1/19 EEG indicating moderate to severe cerebral dysfunction (encephalopathy).   Sedation lightened 1/21 Will continue to follow neuro exam off sedation, seems to have some slow improvement but not clear where the plateau will be May merit an MRI for prognostication, especially if sedation holiday is difficult Depakote for seizure prophylaxis  Family: Updated sister 1/19 evening. Cousin updated 1/21 by Dr Delton Coombes. Will plan to revisit his  progress and decide about duration of support based on his exam 1/25  Independent critical care time is 35 minutes.   Levy Pupaobert Kyrollos Cordell, MD, PhD 01/17/2015, 8:20 AM [facility] [phone] or if no answer [phone]

## 2015-01-17 NOTE — Progress Notes (Signed)
Subjective: Patient more alert today.  Follows commands.  Remains on Depakote.  Objective: Current vital signs: BP 150/94 mmHg  Pulse 85  Temp(Src) 99.6 F (37.6 C) (Oral)  Resp 17  Ht  (1.88 m)  Wt 129.7 kg (285 lb 15 oz)  BMI 36.70 kg/m2  SpO2 98% Vital signs in last 24 hours: Temp:  [98.2 F (36.8 C)-99.6 F (37.6 C)] 99.6 F (37.6 C) (01/24 0800) Pulse Rate:  [80-95] 85 (01/24 1121) Resp:  [16-21] 17 (01/24 1121) BP: (88-178)/(44-97) 150/94 mmHg (01/24 1121) SpO2:  [91 %-100 %] 98 % (01/24 1121) FiO2 (%):  [40 %] 40 % (01/24 1121) Weight:  [129.7 kg (285 lb 15 oz)] 129.7 kg (285 lb 15 oz) (01/24 0300)  Intake/Output from previous day: 01/23 0701 - 01/24 0700 In: 1867.4 [I.V.:230; WU/JW:1191.4; IV Piggyback:60] Out: 15 [Urine:15] Intake/Output this shift: Total I/O In: 485 [I.V.:30; NG/GT:335; IV Piggyback:120] Out: -  Nutritional status:    Neurologic Exam: Mental Status: Eyes open. Fixes and tracks examiner around the room.  Follows commands.   Cranial Nerves: II: Blinks to confrontation bilaterally, pupils right 2 mm, left 2 mm,and reactive bilaterally III,IV,VI: doll's response present bilaterally.  V,VII: corneal reflex present bilaterally  VIII: patient responds to verbal stimuli IX,X: gag reflex reduced, XI: trapezius strength unable to test bilaterally XII: tongue strength unable to test Motor: Squeezes both hands to command and moves both lower extremities to command. No focal weakness noted  Sensory: Responds to light stimuli in all extremities Deep Tendon Reflexes:  1+ throughout. Plantars: mute bilaterally  Lab Results: Basic Metabolic Panel:  Recent Labs Lab 01/13/15 0300 01/14/15 0415 01/15/15 0445 01/16/15 0445 01/17/15 0400  NA 136 135 135 139 141  K 4.6 3.9 3.9 4.6 4.5  CL 100 95* 93* 97 97  CO2 GLUCOSE 88 73 88 83 98  BUN 51* 29* 40* 36* 69*  CREATININE 9.91* 7.42* 9.68* 8.06* 10.51*  CALCIUM 9.0  8.9 9.5 10.0 10.6*  MG  --   --   --  2.3  --   PHOS 7.0*  --   --  8.4*  --     Liver Function Tests:  Recent Labs Lab 01/13/15 0300 01/14/15 0415  AST  --  17  ALT  --  23  ALKPHOS  --  57  BILITOT  --  0.9  PROT  --  5.8*  ALBUMIN 2.5* 2.4*   No results for input(s): LIPASE, AMYLASE in the last 168 hours. No results for input(s): AMMONIA in the last 168 hours.  CBC:  Recent Labs Lab 01/13/15 0300 01/14/15 0415 01/15/15 0445 01/16/15 0445 01/17/15 0400  WBC 10.5 8.3 8.2 8.8 8.6  HGB 11.0* 11.2* 10.5* 10.4* 9.9*  HCT 33.4* 34.0* 31.3* 31.7* 31.0*  MCV 87.0 89.7 85.8 88.3 88.8  PLT 157 145* 148* 161 171    Cardiac Enzymes: No results for input(s): CKTOTAL, CKMB, CKMBINDEX, TROPONINI in the last 168 hours.  Lipid Panel: No results for input(s): CHOL, TRIG, HDL, CHOLHDL, VLDL, LDLCALC in the last 168 hours.  CBG:  Recent Labs Lab 01/16/15 1804 01/16/15 2009 01/17/15 0018 01/17/15 0408 01/17/15 0726  GLUCAP 84 78 80 99 83    Microbiology: Results for orders placed or performed during the hospital encounter of 01/09/15  MRSA PCR Screening     Status: None   Collection Time: 01/09/15  5:31 PM  Result Value Ref Range Status   MRSA by  PCR NEGATIVE NEGATIVE Final    Comment:        The GeneXpert MRSA Assay (FDA approved for NASAL specimens only), is one component of a comprehensive MRSA colonization surveillance program. It is not intended to diagnose MRSA infection nor to guide or monitor treatment for MRSA infections.   Culture, respiratory (NON-Expectorated)     Status: None (Preliminary result)   Collection Time: 01/16/15  3:51 PM  Result Value Ref Range Status   Specimen Description TRACHEAL ASPIRATE  Final   Special Requests NONE  Final   Gram Stain   Final    FEW WBC PRESENT, PREDOMINANTLY PMN FEW SQUAMOUS EPITHELIAL CELLS PRESENT MODERATE GRAM NEGATIVE RODS FEW GRAM NEGATIVE COCCI FEW GRAM POSITIVE COCCI IN PAIRS    Culture PENDING   Incomplete   Report Status PENDING  Incomplete    Coagulation Studies: No results for input(s): LABPROT, INR in the last 72 hours.  Imaging: Dg Chest Port 1 View  01/17/2015   CLINICAL DATA:  Respiratory failure  EXAM: PORTABLE CHEST - 1 VIEW  COMPARISON:  01/15/2015  FINDINGS: Nasogastric tube is in the stomach. Cardiac enlargement stable. There is a moderate left pleural effusion. Mild interstitial edema is unchanged. There is new atelectasis in the right base.  IMPRESSION: Stable pulmonary edema.  New right base atelectasis.   Electronically Signed   By: Signa Kellaylor  Stroud M.D.   On: 01/17/2015 07:21    Medications:  I have reviewed the patient's current medications. Scheduled: . antiseptic oral rinse  7 mL Mouth Rinse QID  . aspirin  81 mg Oral Daily  . atorvastatin  80 mg Oral q1800  . chlorhexidine  15 mL Mouth Rinse BID  . dextrose  1 ampule Intravenous Once  . doxercalciferol  14 mcg Intravenous Q M,W,F-HD  . feeding supplement (VITAL HIGH PROTEIN)  1,000 mL Per Tube Q24H  . pantoprazole sodium  40 mg Per Tube Daily  . piperacillin-tazobactam (ZOSYN)  IV  2.25 g Intravenous 3 times per day  . valproate sodium  1,000 mg Intravenous Q12H    Assessment/Plan: Patient continues to improve and although can not rule out the possibility that there may be some underlying hypoperfusion injury at this point it will likely not be a limiting factor for further improvement.  Remains on Depakote.  No seizures noted.  Recommendations: 1.  Continue current management.  Will follow as needed.   LOS: 8 days   Thana FarrLeslie Seibert Keeter, MD [facility] [phone] 01/17/2015  12:10 PM

## 2015-01-17 NOTE — Progress Notes (Signed)
Patient ID: [name] [name], male   DOB: 04-12-1961, 54 y.o.   MRN: [id number]   SUBJECTIVE:   Dialysis Monday   Still slow to respond  Copious secretions   Scheduled Meds: . antiseptic oral rinse  7 mL Mouth Rinse QID  . aspirin  81 mg Oral Daily  . atorvastatin  80 mg Oral q1800  . chlorhexidine  15 mL Mouth Rinse BID  . dextrose  1 ampule Intravenous Once  . doxercalciferol  14 mcg Intravenous Q M,W,F-HD  . feeding supplement (VITAL HIGH PROTEIN)  1,000 mL Per Tube Q24H  . pantoprazole sodium  40 mg Per Tube Daily  . valproate sodium  1,000 mg Intravenous Q12H   Continuous Infusions: . sodium chloride 10 mL/hr at 01/16/15 0700  . dextrose Stopped (01/15/15 0137)   PRN Meds:.feeding supplement (NEPRO CARB STEADY), fentaNYL, heparin, hydrALAZINE, ondansetron (ZOFRAN) IV    Filed Vitals:   01/17/15 0400 01/17/15 0500 01/17/15 0600 01/17/15 0700  BP: 113/66 172/95 119/76 131/69  Pulse: 85 86 83 81  Temp: 98.9 F (37.2 C)     TempSrc: Oral     Resp:      Height:      Weight:      SpO2: 96% 95% 96% 93%    Intake/Output Summary (Last 24 hours) at 01/17/15 0814 Last data filed at 01/17/15 0600  Gross per 24 hour  Intake 1687.42 ml  Output      0 ml  Net 1687.42 ml    LABS: Basic Metabolic Panel:  Recent Labs  40/98/11 0445 01/17/15 0400  NA 139 141  K 4.6 4.5  CL 97 97  CO2 27 30  GLUCOSE 83 98  BUN 36* 69*  CREATININE 8.06* 10.51*  CALCIUM 10.0 10.6*  MG 2.3  --   PHOS 8.4*  --    Liver Function Tests: No results for input(s): AST, ALT, ALKPHOS, BILITOT, PROT, ALBUMIN in the last 72 hours. CBC:  Recent Labs  01/16/15 0445 01/17/15 0400  WBC 8.8 8.6  HGB 10.4* 9.9*  HCT 31.7* 31.0*  MCV 88.3 88.8  PLT 161 171    RADIOLOGY: Ct Head Wo Contrast  01/12/2015   CLINICAL DATA:  Cerebral anoxia following cardiac arrest.  EXAM: CT HEAD WITHOUT CONTRAST  TECHNIQUE: Contiguous axial images were obtained from the base of the skull through the vertex  without intravenous contrast.  COMPARISON:  01/09/2015  FINDINGS: There is no evidence of acute cortical infarct, intracranial hemorrhage, mass, midline shift, or extra-axial fluid collection. Gray-white differentiation is preserved without clear evidence of cerebral edema. Ventricles and sulci are unchanged in size and configuration.  Mastoid air cells are clear. Endotracheal and enteric tubes are partially visualized. Fluid is present in the left frontal sinus, multiple left ethmoid air cells, sphenoid sinuses, and nasal cavity. Orbits are unremarkable.  IMPRESSION: No acute intracranial abnormality identified.   Electronically Signed   By: Sebastian Ache   On: 01/12/2015 14:36   Ct Head Wo Contrast  01/09/2015   CLINICAL DATA:  Post code.  Question anoxic brain injury.  EXAM: CT HEAD WITHOUT CONTRAST  TECHNIQUE: Contiguous axial images were obtained from the base of the skull through the vertex without intravenous contrast.  COMPARISON:  None.  FINDINGS: No acute intracranial abnormality. Specifically, no hemorrhage, hydrocephalus, mass lesion, acute infarction, or significant intracranial injury. No acute calvarial abnormality. No current CT evidence or not brain injury.  Extensive soft tissue thickening within the nasal airway and ethmoid air  cells as well as maxillary sinuses and sphenoid sinus. Air-fluid levels in the sphenoid sinuses. Mastoid air cells are clear.  IMPRESSION: No acute intracranial abnormality.   Electronically Signed   By: Charlett Nose M.D.   On: 01/09/2015 14:40   Dg Chest Port 1 View  01/17/2015   CLINICAL DATA:  Respiratory failure  EXAM: PORTABLE CHEST - 1 VIEW  COMPARISON:  01/15/2015  FINDINGS: Nasogastric tube is in the stomach. Cardiac enlargement stable. There is a moderate left pleural effusion. Mild interstitial edema is unchanged. There is new atelectasis in the right base.  IMPRESSION: Stable pulmonary edema.  New right base atelectasis.   Electronically Signed   By: Signa Kell M.D.   On: 01/17/2015 07:21   Dg Chest Port 1 View  01/15/2015   CLINICAL DATA:  Respiratory failure  EXAM: PORTABLE CHEST - 1 VIEW  COMPARISON:  01/14/2015  FINDINGS: There is an endotracheal tube with tip just below the clavicular heads. A gastric suction tube reaches the stomach. Right IJ catheter, tip at the SVC level.  Improved aeration with the lower lungs better inflated. Pulmonary edema is resolved and pleural effusions or atelectasis have decreased.  IMPRESSION: 1. Stable positioning of tubes and central line. 2. Improved pulmonary inflation and clearing pulmonary edema.   Electronically Signed   By: Tiburcio Pea M.D.   On: 01/15/2015 04:41   Dg Chest Port 1 View  01/14/2015   CLINICAL DATA:  54 year old hypertensive male with end-stage renal disease. Assess endotracheal tube. Subsequent encounter.  EXAM: PORTABLE CHEST - 1 VIEW  COMPARISON:  01/13/2015  FINDINGS: Rotation to the right. Right costophrenic angle not included on present exam.  Endotracheal tube tip 3.8 cm above the carina. Right central line tip proximal superior vena cava level. Nasogastric tube courses below the diaphragm. Tip is not included on the present exam.  Cardiomegaly.  Pulmonary vascular congestion/mild pulmonary edema.  Basilar consolidation greater on the left unchanged.  Tortuous aorta  IMPRESSION: Overall no significant change.  Cardiomegaly.  Pulmonary vascular congestion/ mild pulmonary edema pulmonary edema.  Basilar consolidation greater on the left unchanged.  Tortuous aorta   Electronically Signed   By: Bridgett Larsson M.D.   On: 01/14/2015 07:22   Dg Chest Port 1 View  01/13/2015   CLINICAL DATA:  Respiratory failure .  EXAM: PORTABLE CHEST - 1 VIEW  COMPARISON:  01/12/2015.  FINDINGS: Tracheostomy tube and NG tube in stable position. Right IJ line stable position. Persistent severe cardiomegaly with bilateral pulmonary alveolar infiltrates and pleural effusions consistent with congestive heart failure.   IMPRESSION: 1. Lines and tubes in stable position. 2. Persistent severe cardiomegaly with bilateral pulmonary alveolar infiltrates and pleural effusions consistent with congestive heart failure. Superimposed pneumonia cannot be excluded. No interim change.   Electronically Signed   By: Maisie Fus  Register   On: 01/13/2015 07:38   Dg Chest Port 1 View  01/12/2015   CLINICAL DATA:  Cardiac arrest.  EXAM: PORTABLE CHEST - 1 VIEW  COMPARISON:  01/11/2015 .  FINDINGS: Lines and tubes in stable position. Cardiomegaly. Bilateral pulmonary alveolar infiltrates again noted. Small bilateral pleural effusions. No pneumothorax.  IMPRESSION: 1. Lines and tubes in stable position. 2. Persistent severe cardiomegaly. Bilateral pulmonary infiltrates most likely secondary to congestive heart failure and pulmonary edema. Small bilateral pleural effusions are present . No significant interim change.   Electronically Signed   By: Maisie Fus  Register   On: 01/12/2015 07:20   Dg Chest Port 1 [address]  01/11/2015   CLINICAL DATA:  Acute respiratory failure.  EXAM: PORTABLE CHEST - 1 VIEW  COMPARISON:  01/10/2015  FINDINGS: There is an endotracheal tube with tip at the clavicular heads. A gastric suction tube reaches the diaphragm. Stable positioning of right IJ central line, tip at the SVC.  Unchanged cardiomegaly.  Unchanged upper mediastinal contours.  Central pulmonary venous congestion. Increased hazy lower lung opacities suggesting layering pleural fluid. There are probable superimposed lower lung opacities. No pneumothorax.  IMPRESSION: 1. Unchanged positioning of tubes and central line. 2. Increasing or layering bilateral pleural effusions. Pulmonary venous congestion and basilar lung opacification is unchanged.   Electronically Signed   By: Tiburcio Pea M.D.   On: 01/11/2015 02:55   Dg Chest Port 1 View  01/10/2015   CLINICAL DATA:  Subsequent encounter for altered mental status. History of hypertension.  EXAM: PORTABLE CHEST - 1  VIEW  COMPARISON:  01/09/2015  FINDINGS: Endotracheal tube difficult to visualize centrally. Nasogastric extends beyond the inferior aspect of the film. Right internal jugular line tip mid SVC.  Numerous leads and wires project over the chest. Cardiomegaly accentuated by AP portable technique. Developing left greater than right pleural effusions. No pneumothorax. Interstitial edema is moderate. Progressive.  IMPRESSION: Worsened aeration, with increased congestive heart failure, developing pleural effusions, and left greater than right bibasilar airspace disease.   Electronically Signed   By: Jeronimo Greaves M.D.   On: 01/10/2015 09:30   Dg Chest Port 1 View  01/09/2015   CLINICAL DATA:  Central line placement  EXAM: PORTABLE CHEST - 1 VIEW  COMPARISON:  01/09/2015  FINDINGS: Right internal jugular central line is in place with the tip in the SVC. No pneumothorax. Endotracheal tube and NG tube remain in place, unchanged. Cardiomegaly. Vascular congestion. No real change since prior study.  IMPRESSION: Right central line tip in the SVC.  No pneumothorax.   Electronically Signed   By: Charlett Nose M.D.   On: 01/09/2015 15:20   Dg Chest Portable 1 View  01/09/2015   CLINICAL DATA:  Cardiac arrest.  Intubated.  EXAM: PORTABLE CHEST - 1 VIEW  COMPARISON:  12/02/2013.  FINDINGS: Endotracheal tube in satisfactory position. Nasogastric tube extending into the stomach. Stable enlarged cardiac silhouette. Support apparatus obscuring portions of the chest with no gross lung infiltrates. There is a linear lucency overlying the mediastinum on the left. No definite pneumothorax seen.  IMPRESSION: Limited examination with a linear artifact or pneumomediastinum noted on the left.   Electronically Signed   By: Gordan Payment M.D.   On: 01/09/2015 14:27   Dg Abd Portable 1v  01/11/2015   CLINICAL DATA:  Ileus  EXAM: PORTABLE ABDOMEN - 1 VIEW  COMPARISON:  None.  FINDINGS: There is nonspecific nonobstructive small bowel gas  pattern. Mild colonic distension with gas and some stool probable mild colonic ileus. Some gas noted within stomach. NG tube in place. The left colon is not distended.  IMPRESSION: Nonobstructed small bowel gas pattern. Mild colonic distension with gas and stool probable mild ileus. Some gas noted within stomach. NG tube in place PICC   Electronically Signed   By: Natasha Mead M.D.   On: 01/11/2015 10:38    PHYSICAL EXAM General: Sedated on vent.  Neck: No JVD, no thyromegaly or thyroid nodule.  Lungs: Coarse breath sounds bilaterally.  CV: Nondisplaced PMI.  Heart regular S1/S2, no S3/S4, no murmur.  No peripheral edema.   Abdomen: Soft, nontender, no hepatosplenomegaly, no distention.  Neurologic:  Opens eyes. Does not respond to pain for me Extremities: No clubbing or cyanosis. LUE AVF large and tortuous   TELEMETRY: Reviewed telemetry pt in NSR  ASSESSMENT AND PLAN: 54 yo with history of ESRD and CAD at prior cath was admitted after cardiac arrest.  1. Cardiac arrest: VT/VF with defibrillation x 3.  Prolonged down-time.  He underwent hypothermia and is now rewarmed.  EF 55-60% on echo.  No significant change on ECG from prior.  No purposeful movement so far.  Possibly related to cocaine. Doubt true NSTEMI - If patient shows neurologic recovery, will need cardiac cath +/- ICD - Continue ASA 81.  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 ESRD: BP dropping with HD, off BP meds.  4. HTN: Can give prn hydralazine. 5. Acute respiratory failure: per CCM   6. Cocaine abuse. UDS + this admit 7. CAD:  cardiac catheterization after a non-STEMI in the setting of cocaine abuse in 2014 . Showed a stenosis in a diagonal branch. This was not intervened upon due to patient noncompliance with dialysis and drug abuse. Continue ASA and statin.   Hemodynamically stable. Seems to have significant anoxic brain injury.  If has meaningful recovery can consider cath +/- ICD at that point.  On depakote  for seizure prophylaxis per neuro   Charlton HawsPeter Nishan MD 01/17/2015 8:14 AM

## 2015-01-18 ENCOUNTER — Inpatient Hospital Stay (HOSPITAL_COMMUNITY): Payer: Medicare Other

## 2015-01-18 DIAGNOSIS — F1410 Cocaine abuse, uncomplicated: Secondary | ICD-10-CM

## 2015-01-18 DIAGNOSIS — R40241 Glasgow coma scale score 13-15: Secondary | ICD-10-CM

## 2015-01-18 DIAGNOSIS — J9600 Acute respiratory failure, unspecified whether with hypoxia or hypercapnia: Secondary | ICD-10-CM | POA: Insufficient documentation

## 2015-01-18 DIAGNOSIS — R400 Somnolence: Secondary | ICD-10-CM

## 2015-01-18 LAB — BASIC METABOLIC PANEL
Anion gap: 16 — ABNORMAL HIGH (ref 5–15)
BUN: 107 mg/dL — ABNORMAL HIGH (ref 6–23)
CHLORIDE: 97 mmol/L (ref 96–112)
CO2: 28 mmol/L (ref 19–32)
CREATININE: 11.89 mg/dL — AB (ref 0.50–1.35)
Calcium: 10.8 mg/dL — ABNORMAL HIGH (ref 8.4–10.5)
GFR calc Af Amer: 5 mL/min — ABNORMAL LOW (ref >90)
GFR calc non Af Amer: 4 mL/min — ABNORMAL LOW (ref >90)
GLUCOSE: 83 mg/dL (ref 70–99)
POTASSIUM: 4.8 mmol/L (ref 3.5–5.1)
SODIUM: 141 mmol/L (ref 135–145)

## 2015-01-18 LAB — GLUCOSE, CAPILLARY
GLUCOSE-CAPILLARY: 81 mg/dL (ref 70–99)
GLUCOSE-CAPILLARY: 100 mg/dL — AB (ref 70–99)
GLUCOSE-CAPILLARY: 79 mg/dL (ref 70–99)
Glucose-Capillary: 83 mg/dL (ref 70–99)
Glucose-Capillary: 62 mg/dL — ABNORMAL LOW (ref 70–99)
Glucose-Capillary: 69 mg/dL — ABNORMAL LOW (ref 70–99)

## 2015-01-18 LAB — PROCALCITONIN: PROCALCITONIN: 2.34 ng/mL

## 2015-01-18 MED ORDER — DEXTROSE 10 % IV SOLN
INTRAVENOUS | Status: DC
  Administered 2015-01-18 – 2015-01-21 (×4): via INTRAVENOUS

## 2015-01-18 MED ORDER — DEXTROSE 50 % IV SOLN
INTRAVENOUS | Status: AC
  Filled 2015-01-18: qty 50

## 2015-01-18 MED ORDER — CEFTRIAXONE SODIUM IN DEXTROSE 40 MG/ML IV SOLN
2.0000 g | INTRAVENOUS | Status: DC
  Administered 2015-01-18 – 2015-01-21 (×4): 2 g via INTRAVENOUS
  Filled 2015-01-18 (×4): qty 50

## 2015-01-18 MED ORDER — DEXTROSE 50 % IV SOLN
25.0000 mL | Freq: Once | INTRAVENOUS | Status: AC
  Administered 2015-01-18: 25 mL via INTRAVENOUS

## 2015-01-18 NOTE — Progress Notes (Addendum)
Hypoglycemic Event  CBG: 62  Treatment: D50 IV 25 mL  Symptoms: None  Follow-up CBG: Time 1216 CBG Result:100  Possible Reasons for Event: Inadequate meal intake     Rondle Lohse R  Remember to initiate Hypoglycemia Order Set & complete

## 2015-01-18 NOTE — Progress Notes (Signed)
  [facility] Progress Note   Subjective: making eye contact  Filed Vitals:   01/18/15 0300 01/18/15 0400 01/18/15 0500 01/18/15 0600  BP: 103/54  115/58 127/61  Pulse: 80  75 75  Temp:   98.4 F (36.9 C)   TempSrc:   Axillary   Resp: 18  18 18   Height:  6\' 2"  (1.88 m)    Weight:  131.7 kg (290 lb 5.5 oz)    SpO2: 97%  98% 99%   Exam: Intubated, awake, makes eye contact and grips bilat to command No jvd Chest clear ant and lat RRR no MRG Abd soft, obese, NTND, +BS LUA AVF +bruit No LE or UE edema  HD: MWF [facility] 4.5h 500/800 129.5kg 2/2.0 [place] Heparin 9000 LUA AVF No aranesp or Fe, Hectorol 14 ug tiw       Assessment: 1. VT/VF arrest 2. Anoxic encephalopathy - gradually improving 3. VDRF 4. Nutrition - tube feeds 5. ESRD MWF HD 6. HTN/ vol - no meds here, BP's on low side, 2kg up  Plan-  HD today in ICU    [name] [name]  pager [phone]    cell [phone]  01/18/2015, 8:01 AM     Recent Labs Lab 01/13/15 0300  01/16/15 0445 01/17/15 0400 01/18/15 0345  NA 136  < > 139 141 141  K 4.6  < > 4.6 4.5 4.8  CL 100  < > 97 97 97  CO2 31  < > 27 30 28   GLUCOSE 88  < > 83 98 83  BUN 51*  < > 36* 69* 107*  CREATININE 9.91*  < > 8.06* 10.51* 11.89*  CALCIUM 9.0  < > 10.0 10.6* 10.8*  PHOS 7.0*  --  8.4*  --   --   < > = values in this interval not displayed.  Recent Labs Lab 01/13/15 0300 01/14/15 0415  AST  --  17  ALT  --  23  ALKPHOS  --  57  BILITOT  --  0.9  PROT  --  5.8*  ALBUMIN 2.5* 2.4*    Recent Labs Lab 01/15/15 0445 01/16/15 0445 01/17/15 0400  WBC 8.2 8.8 8.6  HGB 10.5* 10.4* 9.9*  HCT 31.3* 31.7* 31.0*  MCV 85.8 88.3 88.8  PLT 148* 161 171   . antiseptic oral rinse  7 mL Mouth Rinse QID  . aspirin  81 mg Oral Daily  . atorvastatin  80 mg Oral q1800  . chlorhexidine  15 mL Mouth Rinse BID  . dextrose  1 ampule Intravenous Once  . doxercalciferol  14 mcg Intravenous Q M,W,F-HD  . feeding supplement  (VITAL HIGH PROTEIN)  1,000 mL Per Tube Q24H  . metoCLOPramide  5 mg Per Tube 4 times per day  . pantoprazole sodium  40 mg Per Tube Daily  . piperacillin-tazobactam (ZOSYN)  IV  2.25 g Intravenous 3 times per day  . valproate sodium  1,000 mg Intravenous Q12H   . sodium chloride 10 mL/hr at 01/18/15 0737  . dextrose Stopped (01/15/15 0137)   feeding supplement (NEPRO CARB STEADY), fentaNYL, heparin, hydrALAZINE, ondansetron (ZOFRAN) IV

## 2015-01-18 NOTE — Progress Notes (Signed)
eLink Physician-Brief Progress Note Patient Name: [name] [name] DOB: 12/08/1961 MRN: [id number]   Date of Service  01/18/2015  HPI/Events of Note  D10 stopped this am, but having hypoglycemia due to inability to advance TF's.   eICU Interventions  Will continue to try to increase TF rate Temporarily restart D10, d/c when CBGs allow     Intervention Category Intermediate Interventions: Other:  Charon Akamine S. 01/18/2015, 5:08 PM

## 2015-01-18 NOTE — Progress Notes (Signed)
PULMONARY / CRITICAL CARE MEDICINE   Name: [name] [name] MRN: [id number] DOB: Aug 24, 1961    ADMISSION DATE:  01/09/2015   REFERRING MD :  ED  CHIEF COMPLAINT:  VT arrest  INITIAL PRESENTATION: Intubated post arrest  PATIENT SUMMARY:   54 yo ESRD , poorly compliant with HD MWF admitted after a cardia arrest on 1/16. He had 10 minutes of CPR along with one shock applied. Intubated in the field. Hypothermia initiated on arrival and taken to Cath lab urgently.  STUDIES:  1/16 CT head>> no acute abnormalities 1/18 Abdominal xray >> nonobstructive small bowel gas pattern. ? Mild colonic ileus 1/19 repeat Head ct scan >> normal    SIGNIFICANT EVENTS: 1/16 VT/V F arrest 1/16 Hypothermia >>1/17 1/16 CVL right IJ >> 1/16 A-line rt radial >> 1/21 1/18 HD attempted; did not tolerate it very well due to hypotension, did have some volume removal 1/18 Consulted neurology for prognostication 1/20 Updated patient's family (cousins). Sisters not present.   SUBJECTIVE:  Follows commands, improved  VITAL SIGNS: Temp:  [97.5 F (36.4 C)-99.2 F (37.3 C)] 97.5 F (36.4 C) (01/25 0900) Pulse Rate:  [75-91] 88 (01/25 1000) Resp:  [17-25] 21 (01/25 1000) BP: (99-190)/(54-94) 190/92 mmHg (01/25 1000) SpO2:  [92 %-100 %] 94 % (01/25 1000) FiO2 (%):  [40 %] 40 % (01/25 0810) Weight:  [131.7 kg (290 lb 5.5 oz)] 131.7 kg (290 lb 5.5 oz) (01/25 0400) HEMODYNAMICS: CVP:  [8 mmHg-10 mmHg] 10 mmHg VENTILATOR SETTINGS: Vent Mode:  [-] CPAP;PSV FiO2 (%):  [40 %] 40 % Set Rate:  [18 bmp] 18 bmp Vt Set:  [600 mL] 600 mL PEEP:  [5 cmH20] 5 cmH20 Pressure Support:  [5 cmH20-8 cmH20] 5 cmH20 Plateau Pressure:  [16 cmH20-19 cmH20] 19 cmH20 INTAKE / OUTPUT:  Intake/Output Summary (Last 24 hours) at 01/18/15 1018 Last data filed at 01/18/15 1000  Gross per 24 hour  Intake   1580 ml  Output      0 ml  Net   1580 ml    PHYSICAL EXAMINATION: General:  Obese AAM Neuro:  Opening eyes  spontaneously, moves all ext, awake, follows commands  HEENT: Left ij av graft Cardiovascular:  RRR, no M Lungs:  ronchi  mild Abdomen:  Obese + b s. , no r/g, non tender Musculoskeletal:  Intact,  Skin:  Left arm av graft x 2, protuberant pulsatile. No signs of infecttion  LABS:  CBC  Recent Labs Lab 01/15/15 0445 01/16/15 0445 01/17/15 0400  WBC 8.2 8.8 8.6  HGB 10.5* 10.4* 9.9*  HCT 31.3* 31.7* 31.0*  PLT 148* 161 171   Coag's No results for input(s): APTT, INR in the last 168 hours. BMET  Recent Labs Lab 01/16/15 0445 01/17/15 0400 01/18/15 0345  NA 139 141 141  K 4.6 4.5 4.8  CL 97 97 97  CO2 27 30 28   BUN 36* 69* 107*  CREATININE 8.06* 10.51* 11.89*  GLUCOSE 83 98 83   Electrolytes  Recent Labs Lab 01/13/15 0300  01/16/15 0445 01/17/15 0400 01/18/15 0345  CALCIUM 9.0  < > 10.0 10.6* 10.8*  MG  --   --  2.3  --   --   PHOS 7.0*  --  8.4*  --   --   < > = values in this interval not displayed. Sepsis Markers No results for input(s): LATICACIDVEN, PROCALCITON, O2SATVEN in the last 168 hours. ABG No results for input(s): PHART, PCO2ART, PO2ART in the last 168 hours. Liver  Enzymes  Recent Labs Lab 01/13/15 0300 01/14/15 0415  AST  --  17  ALT  --  23  ALKPHOS  --  57  BILITOT  --  0.9  ALBUMIN 2.5* 2.4*   Cardiac Enzymes No results for input(s): TROPONINI, PROBNP in the last 168 hours. Glucose  Recent Labs Lab 01/17/15 1614 01/17/15 1639 01/17/15 2049 01/17/15 2334 01/18/15 0438 01/18/15 0902  GLUCAP 68* 107* 89 79 81 83    Imaging Dg Chest Port 1 View  01/17/2015   CLINICAL DATA:  Respiratory failure  EXAM: PORTABLE CHEST - 1 VIEW  COMPARISON:  01/15/2015  FINDINGS: Nasogastric tube is in the stomach. Cardiac enlargement stable. There is a moderate left pleural effusion. Mild interstitial edema is unchanged. There is new atelectasis in the right base.  IMPRESSION: Stable pulmonary edema.  New right base atelectasis.    Electronically Signed   By: Signa Kell M.D.   On: 01/17/2015 07:21   Pcxr reviewed  ASSESSMENT / PLAN:  PULMONARY OETT 1/16 per ems>> A: VDRF post VT arrest Hypothermia protocol completed pulm edema on admission P:   Wean cpap 5 ps 8 noted, goal in am cpap 5 ps 5 Improved neurostatus Secretions precluded extubation attempt Would consider trach end of week if not progressing Advance ett Neg balance goals remain, for HD   CARDIOVASCULAR A:  Post arrest VT/VF shocked x 3 Initial HTN > resolved with cardene gtt Hx of CAD inferior Q waves and ST elevation on old ECGs Mild trop elevation 0.8 >0.78>0.69 Hypotension with HD 1/20 P:  ASA, lipitor Evaluate BP post HD  RENAL A:   ESRD HD M W F Hypokalemia evolved to hyperkalemia.  Some difficulty tolerating HD due to low BP P:   Hd today planned No pressors - neg balance goals  GASTROINTESTINAL A:   GI protection 1/18 noticed increased GI secretions, ? SBO / ileus. At some risk for bowel ischemia 1/18 Abdominal xray >> nonobstructive small bowel gas pattern. ? Mild colonic ileus Residuals improved P:   No evidence of significant bleeding in GI tract. Will monitor clinically Cont with PPI for SUP Zofran, restart TF at 10 cc/hr and no increase  HEMATOLOGIC A:   Leukocytosis >> resolved  P:  Follow cbc   INFECTIOUS A:   No clear infectious process but he does have significant purulent secretions.  Leukocytosis was likely WBC demargination No fevers since 1/18 resp 1/23 >> GNR, GPC, GNC >>  P:   Zosyn 1/24>>   Start Zosyn, suspected tracheobronchitis 1/24 Narrow off to ceftriaxone , 5 days planned  pcxr in am  Pct algo to limit abx duration  ENDOCRINE CBG (last 3)   Recent Labs  01/17/15 2334 01/18/15 0438 01/18/15 0902  GLUCAP 79 81 83   A:   Hypoglycemia P:   d10 iff, add TF back at 10 cc/hr Monitor cbg q4h.   NEUROLOGIC A:   Significant anoxia, presumed brain injury but some slow  interval improvement Post arrest non responsive, but exam complicated by sedation Head ct scan 1/16 and 1/19 both nl  P:   RASS goal: 0 1/18 Consulted neurology to assist with prognosis. 1/19 EEG indicating moderate to severe cerebral dysfunction (encephalopathy).   Improved  Depakote for seizure prophylaxis Prn fent Chair position PT on vent    Independent critical care time is 35 minutes.   Mcarthur Rossetti. Tyson Alias, MD, FACP Pgr: [phone] [facility]

## 2015-01-18 NOTE — Progress Notes (Signed)
Patient had 2 hypoglycemic events today. Tube feeds slowed due to high gastric residuals.  MD made aware. Orders to restart D-10.    Rise PaganiniURRY, Gracelyn Coventry R, RN

## 2015-01-18 NOTE — Progress Notes (Signed)
PROGRESS NOTE  Primary cardiologist:  Hochrein Subjective:     Mr. Pink is a 54 year old gentleman with a history of end-stage renal disease and polysubstance abuse. He had a non-ST segment elevation myocardial infarction in 2014. This was associated with cocaine abuse. He had a diagnostic heart catheterization which revealed stenosis in the diagonal branch. PCI  was not attempted because of the patient's noncompliance with dialysis and drug abuse. Head normal LV function at that time.   the patient was found unconscious at home on Jan. 16. EMS was called. He was defibrillated several  Times.  because of the prolonged down time and  question of his neurologic status, he was not cathed on admission.  His UDS was + for cocaine this admission.   Objective:    Vital Signs:   Temp:  [98.4 F (36.9 C)-99.2 F (37.3 C)] 98.4 F (36.9 C) (01/25 0500) Pulse Rate:  [75-91] 75 (01/25 0600) Resp:  [17-25] 18 (01/25 0600) BP: (99-171)/(54-94) 127/61 mmHg (01/25 0600) SpO2:  [92 %-100 %] 99 % (01/25 0600) FiO2 (%):  [40 %] 40 % (01/25 0810) Weight:  [290 lb 5.5 oz (131.7 kg)] 290 lb 5.5 oz (131.7 kg) (01/25 0400)      24-hour weight change: Weight change: 4 lb 6.5 oz (2 kg)  Weight trends: Filed Weights   01/15/15 0500 01/17/15 0300 01/18/15 0400  Weight: 295 lb 6.7 oz (134 kg) 285 lb 15 oz (129.7 kg) 290 lb 5.5 oz (131.7 kg)    Intake/Output:  01/24 0701 - 01/25 0700 In: 1875 [I.V.:230; NG/GT:1265; IV Piggyback:380] Out: -      Physical Exam: BP 127/61 mmHg  Pulse 75  Temp(Src) 98.4 F (36.9 C) (Axillary)  Resp 18  Ht  (1.88 m)  Wt 290 lb 5.5 oz (131.7 kg)  BMI 37.26 kg/m2  SpO2 99%  Wt Readings from Last 3 Encounters:  01/18/15 290 lb 5.5 oz (131.7 kg)  09/08/14 292 lb (132.45 kg)  03/17/14 312 lb 8 oz (141.749 kg)    General: Vital signs reviewed and noted. Responding to questions.   Head: Normocephalic, atraumatic.  Eyes: conjunctivae/corneas clear.   EOM's intact.   Throat: normal  Neck:  normal   Lungs:    clear , on vent   Heart:  RR, occasional premature beats   Abdomen:  Soft, non-tender, non-distended    Extremities: Trace - 1+ edema    Neurologic: A&O X3, CN II - XII are grossly intact.   Psych: Seems to be Normal     Labs: BMET:  Recent Labs  01/16/15 0445 01/17/15 0400 01/18/15 0345  NA 139 141 141  K 4.6 4.5 4.8  CL 97 97 97  CO2 GLUCOSE 83 98 83  BUN 36* 69* 107*  CREATININE 8.06* 10.51* 11.89*  CALCIUM 10.0 10.6* 10.8*  MG 2.3  --   --   PHOS 8.4*  --   --     Liver function tests: No results for input(s): AST, ALT, ALKPHOS, BILITOT, PROT, ALBUMIN in the last 72 hours. No results for input(s): LIPASE, AMYLASE in the last 72 hours.  CBC:  Recent Labs  01/16/15 0445 01/17/15 0400  WBC 8.8 8.6  HGB 10.4* 9.9*  HCT 31.7* 31.0*  MCV 88.3 88.8  PLT 161 171    Cardiac Enzymes: No results for input(s): CKTOTAL, CKMB, TROPONINI in the last 72 hours.  Coagulation Studies: No results for input(s): LABPROT, INR in  the last 72 hours.  Other: Invalid input(s): POCBNP No results for input(s): DDIMER in the last 72 hours. No results for input(s): HGBA1C in the last 72 hours. No results for input(s): CHOL, HDL, LDLCALC, TRIG, CHOLHDL in the last 72 hours. No results for input(s): TSH, T4TOTAL, T3FREE, THYROIDAB in the last 72 hours.  Invalid input(s): FREET3 No results for input(s): VITAMINB12, FOLATE, FERRITIN, TIBC, IRON, RETICCTPCT in the last 72 hours.   Other results:  Tele:  nSR with frequent PVCs.   Medications:    Infusions: . sodium chloride 10 mL/hr at 01/18/15 0737  . dextrose Stopped (01/15/15 0137)    Scheduled Medications: . antiseptic oral rinse  7 mL Mouth Rinse QID  . aspirin  81 mg Oral Daily  . atorvastatin  80 mg Oral q1800  . chlorhexidine  15 mL Mouth Rinse BID  . dextrose  1 ampule Intravenous Once  . doxercalciferol  14 mcg Intravenous Q M,W,F-HD  .  feeding supplement (VITAL HIGH PROTEIN)  1,000 mL Per Tube Q24H  . metoCLOPramide  5 mg Per Tube 4 times per day  . pantoprazole sodium  40 mg Per Tube Daily  . piperacillin-tazobactam (ZOSYN)  IV  2.25 g Intravenous 3 times per day  . valproate sodium  1,000 mg Intravenous Q12H    Assessment/ Plan:      1.  Cardiac arrest:  Was associated with cocaine abuse .  The previous notes suggest that he may need an ICD.  I have concerns with his compliance and his continued cocaine use and I'm not sure that he is a candidate for ICD.  He needs to stop using cocaine or else he will be at risk for further episodes of coronary ischemia and possible cardiac arrest.      2.  Acute respiratory failure with hypoxemia - plans per PCCM   3.   Cardiogenic shock - has resolved. Echo shows normal LV systolic function with EF of 55%   4.  Anoxic encephalopathy - improving     5.  Acute respiratory failure with hypoxia  6. ESRD:  Continue with dialysis     Disposition: continue supportive care.  Length of Stay: 9  Vesta MixerPhilip J. Quinnton Bury, Montez HagemanJr., MD, [facility] 01/18/2015, 9:08 AM Office [phone] Pager [phone]

## 2015-01-18 NOTE — Progress Notes (Deleted)
18 beat run of V-Tach, MD made aware. Pt up in chair, asymptomatic, family in room.  Will continue to monitor closely.   Rise PaganiniURRY, Paytin Ramakrishnan R, RN

## 2015-01-18 NOTE — Progress Notes (Signed)
Pharmacy consult: rocephin  54 yo male s/p cardiac arrrest on zosyn for suspected tracheobronchitis and to change to rocephin. Pharmacy has been consulted to dose rocephin. Plans noted for 5 days of treatment.   Rocephin 1/25>> Zosyn 1/24 >> 1/25  1/23 resp (trach)- GNR, GNC, GPC  Plan -Rocephin 2gm IV q24h -Will follow renal function, cultures and clinical progress  Harland Germanndrew Eleena Grater, Pharm D 01/18/2015 11:27 AM

## 2015-01-19 ENCOUNTER — Inpatient Hospital Stay (HOSPITAL_COMMUNITY): Payer: Medicare Other

## 2015-01-19 DIAGNOSIS — J9600 Acute respiratory failure, unspecified whether with hypoxia or hypercapnia: Secondary | ICD-10-CM

## 2015-01-19 DIAGNOSIS — I2510 Atherosclerotic heart disease of native coronary artery without angina pectoris: Secondary | ICD-10-CM

## 2015-01-19 LAB — GLUCOSE, CAPILLARY
GLUCOSE-CAPILLARY: 101 mg/dL — AB (ref 70–99)
GLUCOSE-CAPILLARY: 60 mg/dL — AB (ref 70–99)
GLUCOSE-CAPILLARY: 77 mg/dL (ref 70–99)
GLUCOSE-CAPILLARY: 81 mg/dL (ref 70–99)
Glucose-Capillary: 107 mg/dL — ABNORMAL HIGH (ref 70–99)
Glucose-Capillary: 125 mg/dL — ABNORMAL HIGH (ref 70–99)
Glucose-Capillary: 52 mg/dL — ABNORMAL LOW (ref 70–99)
Glucose-Capillary: 53 mg/dL — ABNORMAL LOW (ref 70–99)
Glucose-Capillary: 62 mg/dL — ABNORMAL LOW (ref 70–99)
Glucose-Capillary: 74 mg/dL (ref 70–99)
Glucose-Capillary: 78 mg/dL (ref 70–99)
Glucose-Capillary: 80 mg/dL (ref 70–99)
Glucose-Capillary: 86 mg/dL (ref 70–99)
Glucose-Capillary: 95 mg/dL (ref 70–99)
Glucose-Capillary: 99 mg/dL (ref 70–99)

## 2015-01-19 LAB — CULTURE, RESPIRATORY W GRAM STAIN

## 2015-01-19 LAB — CBC
HCT: 29 % — ABNORMAL LOW (ref 39.0–52.0)
HEMOGLOBIN: 9.6 g/dL — AB (ref 13.0–17.0)
MCH: 28.4 pg (ref 26.0–34.0)
MCHC: 33.1 g/dL (ref 30.0–36.0)
MCV: 85.8 fL (ref 78.0–100.0)
PLATELETS: 180 10*3/uL (ref 150–400)
RBC: 3.38 MIL/uL — ABNORMAL LOW (ref 4.22–5.81)
RDW: 14.1 % (ref 11.5–15.5)
WBC: 8.4 10*3/uL (ref 4.0–10.5)

## 2015-01-19 LAB — CULTURE, RESPIRATORY

## 2015-01-19 LAB — CORTISOL: Cortisol, Plasma: 13.9 ug/dL

## 2015-01-19 LAB — LACTIC ACID, PLASMA: Lactic Acid, Venous: 1 mmol/L (ref 0.5–2.0)

## 2015-01-19 LAB — VALPROIC ACID LEVEL: VALPROIC ACID LVL: 47.7 ug/mL — AB (ref 50.0–100.0)

## 2015-01-19 LAB — TSH: TSH: 1.313 u[IU]/mL (ref 0.350–4.500)

## 2015-01-19 LAB — PROCALCITONIN: Procalcitonin: 1.9 ng/mL

## 2015-01-19 MED ORDER — HEPARIN SODIUM (PORCINE) 1000 UNIT/ML DIALYSIS: 1000.0000 [IU] | INTRAMUSCULAR | Status: DC | PRN

## 2015-01-19 MED ORDER — DEXTROSE 50 % IV SOLN
INTRAVENOUS | Status: AC
  Filled 2015-01-19: qty 50

## 2015-01-19 MED ORDER — SODIUM CHLORIDE 0.9 % IV SOLN: 100.0000 mL | INTRAVENOUS | Status: DC | PRN

## 2015-01-19 MED ORDER — DEXTROSE 50 % IV SOLN
25.0000 mL | Freq: Once | INTRAVENOUS | Status: AC
  Administered 2015-01-19: 25 mL via INTRAVENOUS

## 2015-01-19 MED ORDER — LIDOCAINE-PRILOCAINE 2.5-2.5 % EX CREA
1.0000 "application " | TOPICAL_CREAM | CUTANEOUS | Status: DC | PRN
  Filled 2015-01-19: qty 5

## 2015-01-19 MED ORDER — PENTAFLUOROPROP-TETRAFLUOROETH EX AERO: 1.0000 "application " | INHALATION_SPRAY | CUTANEOUS | Status: DC | PRN

## 2015-01-19 MED ORDER — NEPRO/CARBSTEADY PO LIQD
237.0000 mL | ORAL | Status: DC | PRN
  Filled 2015-01-19: qty 237

## 2015-01-19 MED ORDER — LIDOCAINE HCL (PF) 1 % IJ SOLN: 5.0000 mL | INTRAMUSCULAR | Status: DC | PRN

## 2015-01-19 MED ORDER — ALTEPLASE 2 MG IJ SOLR
2.0000 mg | Freq: Once | INTRAMUSCULAR | Status: DC | PRN
  Filled 2015-01-19: qty 2

## 2015-01-19 MED ORDER — DEXTROSE 50 % IV SOLN
1.0000 | Freq: Once | INTRAVENOUS | Status: AC
  Administered 2015-01-19: 50 mL via INTRAVENOUS

## 2015-01-19 MED ORDER — HEPARIN SODIUM (PORCINE) 1000 UNIT/ML DIALYSIS
4000.0000 [IU] | INTRAMUSCULAR | Status: DC | PRN
Start: 2015-01-19 — End: 2015-01-19

## 2015-01-19 MED ORDER — METOCLOPRAMIDE HCL 5 MG/5ML PO SOLN
10.0000 mg | Freq: Four times a day (QID) | ORAL | Status: DC
  Administered 2015-01-19 – 2015-01-20 (×3): 10 mg
  Filled 2015-01-19 (×7): qty 10

## 2015-01-19 NOTE — Progress Notes (Signed)
Hypoglycemic Event  CBG: 60  Treatment: D50 IV 25 mL  Symptoms: None  Follow-up CBG: AVWU:[id number]:0413 CBG Result:101  Possible Reasons for Event: Unknown   Kavin LeechJackson, Lyle Niblett A  Remember to initiate Hypoglycemia Order Set & complete

## 2015-01-19 NOTE — Progress Notes (Addendum)
PROGRESS NOTE  Primary cardiologist:  Hochrein Subjective:     [name] is a 54 year old gentleman with a history of end-stage renal disease and polysubstance abuse. He had a non-ST segment elevation myocardial infarction in 2014. This was associated with cocaine abuse. He had a diagnostic heart catheterization which revealed stenosis in the diagonal branch. PCI  was not attempted because of the patient's noncompliance with dialysis and drug abuse. Head normal LV function at that time.   the patient was found unconscious at home on Jan. 16. EMS was called. He was defibrillated several  Times.  because of the prolonged down time and  question of his neurologic status, he was not cathed on admission.  His UDS was + for cocaine this admission.     Objective:    Vital Signs:   Temp:  [97.9 F (36.6 C)-98.4 F (36.9 C)] 98.2 F (36.8 C) (01/26 0824) Pulse Rate:  [34-88] 74 (01/26 0830) Resp:  [14-36] 18 (01/26 0830) BP: (94-190)/(56-101) 116/57 mmHg (01/26 0830) SpO2:  [92 %-100 %] 98 % (01/26 0830) FiO2 (%):  [40 %] 40 % (01/26 0824) Weight:  [285 lb 4.4 oz (129.4 kg)-290 lb 12.6 oz (131.9 kg)] 285 lb 4.4 oz (129.4 kg) (01/26 0824)  Last BM Date: 01/18/15   24-hour weight change: Weight change: 7.1 oz (0.2 kg)  Weight trends: Filed Weights   01/19/15 0100 01/19/15 0405 01/19/15 0824  Weight: 290 lb 12.6 oz (131.9 kg) 290 lb 12.6 oz (131.9 kg) 285 lb 4.4 oz (129.4 kg)    Intake/Output:  01/25 0701 - 01/26 0700 In: 1401.5 [I.V.:611; NG/GT:620.5; IV Piggyback:170] Out: 100 [Emesis/NG output:100] Total I/O In: 30 [NG/GT:30] Out: 2500 [Other:2500]   Physical Exam: BP 116/57 mmHg  Pulse 74  Temp(Src) 98.2 F (36.8 C) (Axillary)  Resp 18  Ht 6\' 2"  (1.88 m)  Wt 285 lb 4.4 oz (129.4 kg)  BMI 36.61 kg/m2  SpO2 98%  Wt Readings from Last 3 Encounters:  01/19/15 285 lb 4.4 oz (129.4 kg)  09/08/14 292 lb (132.45 kg)  03/17/14 312 lb 8 oz (141.749 kg)     General: Vital signs reviewed and noted. Did not respond to questions this am.    Head: Normocephalic, atraumatic.  Eyes: conjunctivae/corneas clear.  EOM's intact.   Throat: normal  Neck:  normal   Lungs:    clear , on vent   Heart:  RR, occasional premature beats   Abdomen:  Soft, non-tender, non-distended    Extremities: Trace - 1+ edema    Neurologic: opened his eyes but did not respond to me this am. Would not move his arms.  Almost completely  flacid on left  Psych:      Labs: BMET:  Recent Labs  01/17/15 0400 01/18/15 0345  NA 141 141  K 4.5 4.8  CL 97 97  CO2 30 28  GLUCOSE 98 83  BUN 69* 107*  CREATININE 10.51* 11.89*  CALCIUM 10.6* 10.8*    Liver function tests: No results for input(s): AST, ALT, ALKPHOS, BILITOT, PROT, ALBUMIN in the last 72 hours. No results for input(s): LIPASE, AMYLASE in the last 72 hours.  CBC:  Recent Labs  01/17/15 0400  WBC 8.6  HGB 9.9*  HCT 31.0*  MCV 88.8  PLT 171    Cardiac Enzymes: No results for input(s): CKTOTAL, CKMB, TROPONINI in the last 72 hours.  Coagulation Studies: No results for input(s): LABPROT, INR in the last 72 hours.  Other:  Invalid input(s): POCBNP No results for input(s): DDIMER in the last 72 hours. No results for input(s): HGBA1C in the last 72 hours. No results for input(s): CHOL, HDL, LDLCALC, TRIG, CHOLHDL in the last 72 hours. No results for input(s): TSH, T4TOTAL, T3FREE, THYROIDAB in the last 72 hours.  Invalid input(s): FREET3 No results for input(s): VITAMINB12, FOLATE, FERRITIN, TIBC, IRON, RETICCTPCT in the last 72 hours.   Other results:  Tele:  nSR with frequent PVCs.   Medications:    Infusions: . sodium chloride 10 mL/hr at 01/18/15 2000  . dextrose 30 mL/hr at 01/18/15 2000    Scheduled Medications: . antiseptic oral rinse  7 mL Mouth Rinse QID  . aspirin  81 mg Oral Daily  . atorvastatin  80 mg Oral q1800  . cefTRIAXone (ROCEPHIN)  IV  2 g Intravenous Q24H   . chlorhexidine  15 mL Mouth Rinse BID  . dextrose      . doxercalciferol  14 mcg Intravenous Q M,W,F-HD  . feeding supplement (VITAL HIGH PROTEIN)  1,000 mL Per Tube Q24H  . metoCLOPramide  5 mg Per Tube 4 times per day  . pantoprazole sodium  40 mg Per Tube Daily  . valproate sodium  1,000 mg Intravenous Q12H    Assessment/ Plan:      1.  Cardiac arrest:  Was associated with cocaine abuse .  The previous notes suggest that he may need an ICD.  I have concerns with his compliance and his continued cocaine use and I'm not sure that he is a candidate for ICD.  He needs to stop using cocaine or else he will be at risk for further episodes of coronary ischemia and possible cardiac arrest.   Given his lack of compliance, I do not think he is a candidate for ICD     2.  Acute respiratory failure with hypoxemia - plans per PCCM   3.   Cardiogenic shock - has resolved. Echo shows normal LV systolic function with EF of 55%   4.  Anoxic encephalopathy - improving     5.  Acute respiratory failure with hypoxia  6. ESRD:  Continue with dialysis   7. Altered mental status.   :  This is clearly a change from yesterday.  Will check blood glucose.   He may need a head CT.  Will have the nurse notify PCCM.    8. Chronic diastolic CHF:   I agree with the echo results that show chronic diastolic CHF.  On dialysis   Disposition: continue supportive care.  Length of Stay: 10  Vesta Mixer, Montez Hageman., MD, [facility] 01/19/2015, 9:05 AM Office [phone] Pager [phone]

## 2015-01-19 NOTE — Progress Notes (Signed)
Pt. Having loose pasty stools. Unable to scrap and collect sample at 3rd stool due to stool's pasty consistency and it was stuck to the pad. Will collect sample at next stool and send down. Order placed for contact enteric precautions for rule out c-diff per nurse driven protocol for 3 or more loose stools.

## 2015-01-19 NOTE — Progress Notes (Signed)
Level of responsiveness/mental status has improved over the course of the day. Pt following commands, moving extremities.   Will try to limit use of sedating medications to a minimum.   Rise PaganiniURRY, Sena Clouatre R, RN

## 2015-01-19 NOTE — Evaluation (Signed)
Physical Therapy Evaluation Patient Details Name: [name] [name] MRN: [id number] DOB: [birth year] Today's Date: 01/19/2015   History of Present Illness  Pt adm after cardiac arrest. Pt + for cocaine. Pt with VDRF. PMH - ESRD on HD (poor compliance), polysubstance abuse, MI, CHF  Clinical Impression  Pt admitted with above diagnosis. Pt currently with functional limitations due to the deficits listed below (see PT Problem List).  Pt will benefit from skilled PT to increase their independence and safety with mobility to allow discharge to the venue listed below.  Will follow for ex's and early mobility as pt tolerates.     Follow Up Recommendations LTACH    Equipment Recommendations  Other (comment) (TBA)    Recommendations for Other Services       Precautions / Restrictions Precautions Precautions: Fall      Mobility  Bed Mobility                  Transfers                    Ambulation/Gait                Stairs            Wheelchair Mobility    Modified Rankin (Stroke Patients Only)       Balance                                             Pertinent Vitals/Pain Pain Assessment: Faces Faces Pain Scale: No hurt    Home Living Family/patient expects to be discharged to:: Unsure                 Additional Comments: Unable to accurately assess due to pt intubated.    Prior Function Level of Independence: Independent         Comments: As of 13 months ago pt was independent per old chart. Unsure of status prior to this hospitalization but likely he was independent with mobility.     Hand Dominance        Extremity/Trunk Assessment   Upper Extremity Assessment: Difficult to assess due to impaired cognition (PROM WFL. Occasionally assisted with movements)           Lower Extremity Assessment: Difficult to assess due to impaired cognition (PROM WFL. Occasionally assisted with movements)         Communication   Communication: Other (comment) (pt orally intubated)  Cognition Arousal/Alertness: Lethargic Behavior During Therapy: Anxious Overall Cognitive Status: Impaired/Different from baseline Area of Impairment: Following commands       Following Commands: Follows one step commands inconsistently       General Comments: Pt initially lethargic and following no commands. Aroused slightly and followed some commands with verbal/tactile stimuli.    General Comments      Exercises General Exercises - Upper Extremity Shoulder Flexion: PROM;AAROM;Both;Supine;10 reps Shoulder ABduction: PROM;Both;5 reps Elbow Flexion: PROM;Both;5 reps Elbow Extension: PROM;Both;5 reps;Supine Wrist Flexion: PROM;Both;5 reps Wrist Extension: PROM;Both;Supine General Exercises - Lower Extremity Ankle Circles/Pumps: PROM;Both;5 reps;Supine Heel Slides: AAROM;PROM;Both;5 reps;Supine Hip ABduction/ADduction: AAROM;PROM;Both;5 reps;Supine      Assessment/Plan    PT Assessment    PT Diagnosis Difficulty walking;Generalized weakness;Altered mental status   PT Problem List Decreased strength;Decreased activity tolerance;Decreased balance;Decreased mobility;Decreased knowledge of precautions;Decreased knowledge of use of DME;Decreased cognition;Obesity  PT Treatment Interventions DME  instruction;Functional mobility training;Therapeutic activities;Therapeutic exercise;Patient/family education;Balance training   PT Goals (Current goals can be found in the Care Plan section) Acute Rehab PT Goals PT Goal Formulation: Patient unable to participate in goal setting Time For Goal Achievement: 02/02/15 Potential to Achieve Goals: Fair    Frequency Min 2X/week   Barriers to discharge        Co-evaluation               End of Session   Activity Tolerance: Patient limited by lethargy Patient left: in bed;with call bell/phone within reach           Time: 1436-1447 PT Time Calculation  (min) (ACUTE ONLY): 11 min   Charges:   PT Evaluation $Initial PT Evaluation Tier I: 1 Procedure     PT G Codes:        Bron Snellings 01/19/2015, 3:04 PM  [facility] Lake[facility] Hollon PT [phone]

## 2015-01-19 NOTE — Progress Notes (Signed)
Hypoglycemic Event  CBG: 52   Treatment: D50 IV 25 mL  Symptoms: None  ; Emesis  Follow-up CBG: Time:0040 CBG Result:81  Possible Reasons for Event: Unknown  Comments/MD notified: E-Link camera'd in to room during hypoglycemic and emesis occurrence, informed them of same and that RN had given PRN Zofran, D50 IV 25 mL, and gastric residuals at 2000 on 1/25 were 100 mL and green in color.    [name] [name], Gabriel Paulding A  Remember to initiate Hypoglycemia Order Set & complete

## 2015-01-19 NOTE — Progress Notes (Signed)
  [name] [name] is a 54 y.o. male patient.  1. Altered mental status, unspecified altered mental status type   2. Cardiac arrest   3. Altered mental status   4. History of ETT   5. 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 Acute respiratory failure   7. Ileus   8. Cerebral anoxia   9. Acute respiratory failure with hypoxia   10. Respiratory failure   11. Encounter for feeding tube placement   12. Distended abdomen     Blood pressure 178/86, pulse 96, temperature 98.7 F (37.1 C), temperature source Axillary, resp. rate 19, height 6\' 2"  (1.88 m), weight 129.4 kg (285 lb 4.4 oz), SpO2 97 %.  Subjective: UTA intubated; Shakes head when asked is he in pain or uncomfortable.   Objective This a.m patient was less responsive than previous day. Increased delay in following commands, less strength with movement. Pt also had 2 episodes of nausea and vomiting. Emesis bile colored. Lots of thick oral secretions. Recurring hypoglycemic episodes.    Assessment & Plan  MD made aware of these changes and appropriate orders placed.  Abd x-ray reveled ileus. Tube feed already stopped, OG to be placed to inter. Suction. D-10 increased from 30 to 60 mL/Hr.   Rise PaganiniCURRY, [name] Suder R 01/19/2015

## 2015-01-19 NOTE — Progress Notes (Signed)
PULMONARY / CRITICAL CARE MEDICINE   Name: [name] [name] MRN: [id number] DOB: June 25, 1961    ADMISSION DATE:  01/09/2015   REFERRING MD :  ED  CHIEF COMPLAINT:  VT arrest  INITIAL PRESENTATION: Intubated post arrest  PATIENT SUMMARY:   54 yo ESRD , poorly compliant with HD MWF admitted after a cardia arrest on 1/16. He had 10 minutes of CPR along with one shock applied. Intubated in the field. Hypothermia initiated on arrival and taken to Cath lab urgently.  STUDIES:  1/16 CT head>> no acute abnormalities 1/18 Abdominal xray >> nonobstructive small bowel gas pattern. ? Mild colonic ileus 1/19 repeat Head ct scan >> normal  1/26: pCXR: b/l mild interstitial prominence. Subsegmental atelectasis.   SIGNIFICANT EVENTS: 1/16 VT/V F arrest 1/16 Hypothermia >>1/17 1/16 CVL right IJ >> 1/16 A-line rt radial >> 1/21 1/18 HD attempted; did not tolerate it very well due to hypotension, did have some volume removal 1/18 Consulted neurology for prognostication 1/20 Updated patient's family (cousins). Sisters not present.  SUBJECTIVE:  Very lethargic today, waxing and waning strength, in and out of sleep, slow to commands. Noted to have greenish emesis since last night. HD this AM. Diarrhea started last night.   VITAL SIGNS: Temp:  [97.9 F (36.6 C)-98.4 F (36.9 C)] 98.2 F (36.8 C) (01/26 0824) Pulse Rate:  [34-85] 74 (01/26 0830) Resp:  [14-36] 18 (01/26 0830) BP: (94-185)/(56-101) 116/57 mmHg (01/26 0830) SpO2:  [92 %-100 %] 98 % (01/26 0830) FiO2 (%):  [40 %] 40 % (01/26 0900) Weight:  [285 lb 4.4 oz (129.4 kg)-290 lb 12.6 oz (131.9 kg)] 285 lb 4.4 oz (129.4 kg) (01/26 0824) HEMODYNAMICS:   VENTILATOR SETTINGS: Vent Mode:  [-] PRVC FiO2 (%):  [40 %] 40 % Set Rate:  [18 bmp] 18 bmp Vt Set:  [600 mL] 600 mL PEEP:  [5 cmH20] 5 cmH20 Pressure Support:  [8 cmH20] 8 cmH20 Plateau Pressure:  [12 cmH20-18 cmH20] 15 cmH20 INTAKE / OUTPUT:  Intake/Output Summary (Last 24 hours)  at 01/19/15 1016 Last data filed at 01/19/15 0824  Gross per 24 hour  Intake 1361.5 ml  Output   2600 ml  Net -1238.5 ml    PHYSICAL EXAMINATION: General:  Lying in bed, somnolent, on mechanical ventilation Neuro:  Opening eyes spontaneously, moves all extremities but decreased energy and effort, not holding hands up, following commands HEENT: Left ij av graft Cardiovascular:  RRR Lungs:  Coarse b/l breath sounds Abdomen:  Obese, hypoactive bowel sounds, non tender Musculoskeletal:  Intact, edematous extremities Skin:  Left arm av graft x 2, protuberant pulsatile  LABS:  CBC  Recent Labs Lab 01/15/15 0445 01/16/15 0445 01/17/15 0400  WBC 8.2 8.8 8.6  HGB 10.5* 10.4* 9.9*  HCT 31.3* 31.7* 31.0*  PLT 148* 161 171   BMET  Recent Labs Lab 01/16/15 0445 01/17/15 0400 01/18/15 0345  NA 139 141 141  K 4.6 4.5 4.8  CL 97 97 97  CO2 BUN 36* 69* 107*  CREATININE 8.06* 10.51* 11.89*  GLUCOSE 83 98 83   Electrolytes  Recent Labs Lab 01/13/15 0300  01/16/15 0445 01/17/15 0400 01/18/15 0345  CALCIUM 9.0  < > 10.0 10.6* 10.8*  MG  --   --  2.3  --   --   PHOS 7.0*  --  8.4*  --   --   < > = values in this interval not displayed. Sepsis Markers  Recent Labs Lab 01/18/15 1245  01/19/15 0413  PROCALCITON 2.34 1.90   Liver Enzymes  Recent Labs Lab 01/13/15 0300 01/14/15 0415  AST  --  17  ALT  --  23  ALKPHOS  --  57  BILITOT  --  0.9  ALBUMIN 2.5* 2.4*   Glucose  Recent Labs Lab 01/19/15 0042 01/19/15 0346 01/19/15 0412 01/19/15 0803 01/19/15 0838 01/19/15 0915  GLUCAP 81 60* 101* 53* 125* 107*   Imaging Dg Chest Port 1 View  01/18/2015   CLINICAL DATA:  Acute respiratory failure.  EXAM: PORTABLE CHEST - 1 VIEW  COMPARISON:  01/17/2015.  FINDINGS: Interim placement endotracheal tube. Its tip is 7 cm above the carina. Interim placement of right IJ line. Its tip is in the lower portion superior vena cava. NG tube in stable position.  Cardiomegaly. Interim partial clearing of bilateral pulmonary infiltrates/edema. Interim partial clearing of left pleural effusion. No pneumothorax. No acute osseus abnormality.  IMPRESSION: 1. Interim placement of endotracheal tube. Its tip is 7 cm above the carina. Interim placement of right IJ line. Its tip is in the lower portion superior vena cava. NG tube in stable position. 2. Persistent severe cardiomegaly. 3. Interim partial clearing of bilateral pulmonary infiltrates/edema. Interim partial clearing of left pleural effusion.   Electronically Signed   By: Maisie Fushomas  Register   On: 01/18/2015 07:30   Dg Abd Portable 1v  01/18/2015   CLINICAL DATA:  Nasogastric tube repositioning. Check tip placement. Initial encounter.  EXAM: PORTABLE ABDOMEN - 1 VIEW  COMPARISON:  Abdominal radiograph performed 01/11/2015, and chest radiograph performed earlier today at 5:06 a.m.  FINDINGS: The patient's enteric tube is seen ending overlying the body of the stomach, with the side port just above the gastroesophageal junction. This could be twisted slightly and advanced perhaps 3-5 cm, as deemed clinically appropriate.  The visualized bowel gas pattern is grossly unremarkable. No free intra-abdominal air is identified, though evaluation for free air is limited on a single supine view. No acute osseous abnormalities are seen.  IMPRESSION: Enteric tube seen ending overlying the body of the stomach, with the side-port just above the gastroesophageal junction. This could be twisted slightly and advanced perhaps 3-5 cm, as deemed clinically appropriate.   Electronically Signed   By: Roanna RaiderJeffery  Chang M.D.   On: 01/18/2015 17:27   ASSESSMENT / PLAN:  PULMONARY OETT 1/16 per ems>> A: VDRF post VT arrest Hypothermia protocol completed pulm edema on admission P:   Heavy secretions Would consider trach end of week if not progressing Neg balance goals remain, for HD, currently +6L since admission Maintain current MV Wean  attempt cpap 5 ps 10, assess if can reduce PS   CARDIOVASCULAR A:  Post arrest VT/VF shocked x 3 Initial HTN > resolved with cardene gtt Hx of CAD inferior Q waves and ST elevation on old ECGs Mild trop elevation 0.8 >0.78>0.69 Hypotension with HD 1/20 P:  ASA, lipitor Evaluate BP post HD Daily QTC with reglan added and other meds  RENAL A:   ESRD HD M W F Hypokalemia evolved to hyperkalemia.  Some difficulty tolerating HD due to low BP P:   Hd this AM, not done yesterday No pressors - neg balance goals  GASTROINTESTINAL A:   GI protection 1/18 noticed increased GI secretions, ? SBO / ileus. At some risk for bowel ischemia 1/18 Abdominal xray >> nonobstructive small bowel gas pattern. ? Mild colonic ileus Ileus  P:   No evidence of significant bleeding in GI tract. Will monitor  clinically Cont with PPI for SUP Zofran, hold TF abd xray reviewed - c/w ileus Consider check C diff NGT to suction intermittent Reglan maximize, then may need to substitute with erythromycin - daily Qtc  HEMATOLOGIC A:   Leukocytosis >> resolved  Anemia P:  Follow CBC today scd   INFECTIOUS A:   No clear infectious process but he does have significant purulent secretions.  Leukocytosis was likely WBC demargination No fevers since 1/18 resp 1/23 >> GNR, GPC, GNC >> Procalcitonin 1.9   P:   Zosyn 1/24>> 1/25 Ceftriaxone 1/25>>>  pcxr follow up Follow final culture  ENDOCRINE CBG (last 3)   Recent Labs  01/19/15 0803 01/19/15 0838 01/19/15 0915  GLUCAP 53* 125* 107*   A:   Hypoglycemia--recurrent P:   d10 , increase as TF off, ileus Monitor cbg q4h Check cortisol  NEUROLOGIC A:   Significant anoxia, presumed brain injury but some slow interval improvement Post arrest non responsive, but exam complicated by sedation Head ct scan 1/16 and 1/19 both nl  Somnolent today--?uremia vs. depakote ?seizure activity on admission  P:   1/18 Consulted neurology to  assist with prognosis--following 1/19 EEG indicating moderate to severe cerebral dysfunction (encephalopathy).   Worsened mental status since yesterdeay Depakote for seizure prophylaxis - hold Off all sedation, hold all sedating medications PT on vent   Depakote level, HD , then re evalution  Signed: Darden Palmer, MD PGY-3, Internal Medicine Resident Pager: [phone]  01/19/2015,10:33 AM    STAFF NOTE: Cindi Carbon, MD FACP have personally reviewed patient's available data, including medical history, events of note, physical examination and test results as part of my evaluation. I have discussed with resident/NP and other care providers such as pharmacist, RN and RRT. In addition, I personally evaluated patient and elicited key findings of: less responsive, depakote level, JD, re assess, abx, follow sputum, may need trach, will contact family, treating ileus The patient is critically ill with multiple organ systems failure and requires high complexity decision making for assessment and support, frequent evaluation and titration of therapies, application of advanced monitoring technologies and extensive interpretation of multiple databases.   Critical Care Time devoted to patient care services described in this note is30 Minutes. This time reflects time of care of this signee: Rory Percy, MD FACP. This critical care time does not reflect procedure time, or teaching time or supervisory time of PA/NP/Med student/Med Resident etc but could involve care discussion time. Rest per NP/medical resident whose note is outlined above and that I agree with   Mcarthur Rossetti. Tyson Alias, MD, FACP Pgr: [phone]  Pulmonary & Critical Care 01/19/2015 12:28 PM

## 2015-01-19 NOTE — Progress Notes (Signed)
  [facility] Progress Note   Subjective: no changes  Filed Vitals:   01/19/15 0815 01/19/15 0824 01/19/15 0830 01/19/15 1225  BP: 134/60 130/90 116/57 140/84  Pulse: 34 70 74 78  Temp:  98.2 F (36.8 C)    TempSrc:  Axillary    Resp: 20 15 18 19   Height:      Weight:  129.4 kg (285 lb 4.4 oz)    SpO2: 99% 100% 98% 100%   Exam: Intubated No jvd Chest clear ant and lat RRR no MRG Abd soft, obese, NTND, +BS LUA AVF +bruit No LE or UE edema  HD[place] [place] 4.5h 500/800 129.5kg 2/2.0 [place] Heparin 9000 LUA AVF No aranesp or Fe, Hectorol 14 ug tiw       Assessment: 1. VT/VF arrest 2. Anoxic encephalopathy  3. VDRF 4. Nutrition - tube feeds 5. ESRD MWF HD 6. HTN/ vol - no meds here, at dry wt  Plan-  HD Wed in ICU, minimal UF    Vinson Moselleob Carrye Goller MD  pager [phone]    cell [phone]  01/19/2015, 12:51 PM     Recent Labs Lab 01/13/15 0300  01/16/15 0445 01/17/15 0400 01/18/15 0345  NA 136  < > 139 141 141  K 4.6  < > 4.6 4.5 4.8  CL 100  < > 97 97 97  CO2 31  < > 27 30 28   GLUCOSE 88  < > 83 98 83  BUN 51*  < > 36* 69* 107*  CREATININE 9.91*  < > 8.06* 10.51* 11.89*  CALCIUM 9.0  < > 10.0 10.6* 10.8*  PHOS 7.0*  --  8.4*  --   --   < > = values in this interval not displayed.  Recent Labs Lab 01/13/15 0300 01/14/15 0415  AST  --  17  ALT  --  23  ALKPHOS  --  57  BILITOT  --  0.9  PROT  --  5.8*  ALBUMIN 2.5* 2.4*    Recent Labs Lab 01/15/15 0445 01/16/15 0445 01/17/15 0400  WBC 8.2 8.8 8.6  HGB 10.5* 10.4* 9.9*  HCT 31.3* 31.7* 31.0*  MCV 85.8 88.3 88.8  PLT 148* 161 171   . antiseptic oral rinse  7 mL Mouth Rinse QID  . aspirin  81 mg Oral Daily  . atorvastatin  80 mg Oral q1800  . cefTRIAXone (ROCEPHIN)  IV  2 g Intravenous Q24H  . chlorhexidine  15 mL Mouth Rinse BID  . doxercalciferol  14 mcg Intravenous Q M,W,F-HD  . feeding supplement (VITAL HIGH PROTEIN)  1,000 mL Per Tube Q24H  . metoCLOPramide  10 mg  Per Tube 4 times per day  . pantoprazole sodium  40 mg Per Tube Daily   . sodium chloride 10 mL/hr at 01/18/15 2000  . dextrose 30 mL/hr at 01/18/15 2000   sodium chloride, sodium chloride, alteplase, feeding supplement (NEPRO CARB STEADY), feeding supplement (NEPRO CARB STEADY), fentaNYL, heparin, heparin, heparin, hydrALAZINE, lidocaine (PF), lidocaine-prilocaine, ondansetron (ZOFRAN) IV, pentafluoroprop-tetrafluoroeth

## 2015-01-20 ENCOUNTER — Inpatient Hospital Stay (HOSPITAL_COMMUNITY): Payer: Medicare Other

## 2015-01-20 DIAGNOSIS — I480 Paroxysmal atrial fibrillation: Secondary | ICD-10-CM

## 2015-01-20 LAB — RENAL FUNCTION PANEL
ALBUMIN: 2.3 g/dL — AB (ref 3.5–5.2)
Anion gap: 16 — ABNORMAL HIGH (ref 5–15)
BUN: 70 mg/dL — AB (ref 6–23)
CHLORIDE: 94 mmol/L — AB (ref 96–112)
CO2: 25 mmol/L (ref 19–32)
CREATININE: 10.24 mg/dL — AB (ref 0.50–1.35)
Calcium: 10 mg/dL (ref 8.4–10.5)
GFR calc Af Amer: 6 mL/min — ABNORMAL LOW (ref >90)
GFR, EST NON AFRICAN AMERICAN: 5 mL/min — AB (ref >90)
Glucose, Bld: 119 mg/dL — ABNORMAL HIGH (ref 70–99)
PHOSPHORUS: 8.7 mg/dL — AB (ref 2.3–4.6)
Potassium: 3.6 mmol/L (ref 3.5–5.1)
SODIUM: 135 mmol/L (ref 135–145)

## 2015-01-20 LAB — GLUCOSE, CAPILLARY
GLUCOSE-CAPILLARY: 68 mg/dL — AB (ref 70–99)
Glucose-Capillary: 87 mg/dL (ref 70–99)
Glucose-Capillary: 85 mg/dL (ref 70–99)
Glucose-Capillary: 55 mg/dL — ABNORMAL LOW (ref 70–99)
Glucose-Capillary: 72 mg/dL (ref 70–99)
Glucose-Capillary: 74 mg/dL (ref 70–99)

## 2015-01-20 LAB — CBC
HCT: 32.4 % — ABNORMAL LOW (ref 39.0–52.0)
Hemoglobin: 11 g/dL — ABNORMAL LOW (ref 13.0–17.0)
MCH: 29 pg (ref 26.0–34.0)
MCHC: 34 g/dL (ref 30.0–36.0)
MCV: 85.5 fL (ref 78.0–100.0)
PLATELETS: 224 10*3/uL (ref 150–400)
RBC: 3.79 MIL/uL — AB (ref 4.22–5.81)
RDW: 13.8 % (ref 11.5–15.5)
WBC: 14.7 10*3/uL — ABNORMAL HIGH (ref 4.0–10.5)

## 2015-01-20 LAB — HEPATITIS B SURFACE ANTIGEN: Hepatitis B Surface Ag: NEGATIVE

## 2015-01-20 LAB — CLOSTRIDIUM DIFFICILE BY PCR: Toxigenic C. Difficile by PCR: NEGATIVE

## 2015-01-20 LAB — PROCALCITONIN: PROCALCITONIN: 1.69 ng/mL

## 2015-01-20 MED ORDER — AMIODARONE HCL IN DEXTROSE 360-4.14 MG/200ML-% IV SOLN
30.0000 mg/h | INTRAVENOUS | Status: DC
  Administered 2015-01-20 – 2015-01-21 (×3): 30 mg/h via INTRAVENOUS
  Filled 2015-01-20 (×6): qty 200

## 2015-01-20 MED ORDER — HYDROCORTISONE NA SUCCINATE PF 100 MG IJ SOLR
50.0000 mg | Freq: Four times a day (QID) | INTRAMUSCULAR | Status: DC
  Administered 2015-01-20 – 2015-01-21 (×6): 50 mg via INTRAVENOUS
  Filled 2015-01-20 (×8): qty 1

## 2015-01-20 MED ORDER — HEPARIN SODIUM (PORCINE) 1000 UNIT/ML DIALYSIS: 3000.0000 [IU] | INTRAMUSCULAR | Status: DC | PRN

## 2015-01-20 MED ORDER — DILTIAZEM HCL 100 MG IV SOLR
5.0000 mg/h | INTRAVENOUS | Status: DC
  Administered 2015-01-20 – 2015-01-21 (×3): 5 mg/h via INTRAVENOUS
  Filled 2015-01-20 (×3): qty 100

## 2015-01-20 MED ORDER — ALTEPLASE 2 MG IJ SOLR: 2.0000 mg | Freq: Once | INTRAMUSCULAR | Status: DC | PRN

## 2015-01-20 MED ORDER — AMIODARONE LOAD VIA INFUSION
150.0000 mg | Freq: Once | INTRAVENOUS | Status: AC
  Administered 2015-01-20: 150 mg via INTRAVENOUS
  Filled 2015-01-20: qty 83.34

## 2015-01-20 MED ORDER — DILTIAZEM LOAD VIA INFUSION
20.0000 mg | Freq: Once | INTRAVENOUS | Status: AC
  Administered 2015-01-20: 20 mg via INTRAVENOUS
  Filled 2015-01-20: qty 20

## 2015-01-20 MED ORDER — HEPARIN BOLUS VIA INFUSION
4000.0000 [IU] | Freq: Once | INTRAVENOUS | Status: AC
  Administered 2015-01-20: 4000 [IU] via INTRAVENOUS
  Filled 2015-01-20: qty 4000

## 2015-01-20 MED ORDER — AMIODARONE HCL IN DEXTROSE 360-4.14 MG/200ML-% IV SOLN
60.0000 mg/h | INTRAVENOUS | Status: AC
  Administered 2015-01-20 (×2): 60 mg/h via INTRAVENOUS
  Filled 2015-01-20 (×2): qty 200

## 2015-01-20 MED ORDER — DEXTROSE 50 % IV SOLN
25.0000 mL | Freq: Once | INTRAVENOUS | Status: AC
  Administered 2015-01-20: 25 mL via INTRAVENOUS
  Filled 2015-01-20: qty 50

## 2015-01-20 MED ORDER — PENTAFLUOROPROP-TETRAFLUOROETH EX AERO
1.0000 | INHALATION_SPRAY | CUTANEOUS | Status: DC | PRN
Start: 2015-01-20 — End: 2015-01-20

## 2015-01-20 MED ORDER — LIDOCAINE-PRILOCAINE 2.5-2.5 % EX CREA: 1.0000 "application " | TOPICAL_CREAM | CUTANEOUS | Status: DC | PRN

## 2015-01-20 MED ORDER — MIDAZOLAM HCL 2 MG/2ML IJ SOLN
2.0000 mg | INTRAMUSCULAR | Status: DC | PRN
  Administered 2015-01-20: 2 mg via INTRAVENOUS
  Filled 2015-01-20: qty 2

## 2015-01-20 MED ORDER — DEXTROSE 50 % IV SOLN
25.0000 mL | Freq: Once | INTRAVENOUS | Status: AC
  Administered 2015-01-20: 25 mL via INTRAVENOUS

## 2015-01-20 MED ORDER — LIDOCAINE HCL (PF) 1 % IJ SOLN: 5.0000 mL | INTRAMUSCULAR | Status: DC | PRN

## 2015-01-20 MED ORDER — DEXTROSE 50 % IV SOLN
INTRAVENOUS | Status: AC
  Filled 2015-01-20: qty 50

## 2015-01-20 MED ORDER — CETYLPYRIDINIUM CHLORIDE 0.05 % MT LIQD
7.0000 mL | Freq: Two times a day (BID) | OROMUCOSAL | Status: DC
  Administered 2015-01-20 – 2015-01-21 (×2): 7 mL via OROMUCOSAL

## 2015-01-20 MED ORDER — SODIUM CHLORIDE 0.9 % IV SOLN: 100.0000 mL | INTRAVENOUS | Status: DC | PRN

## 2015-01-20 MED ORDER — NEPRO/CARBSTEADY PO LIQD
237.0000 mL | ORAL | Status: DC | PRN
  Filled 2015-01-20: qty 237

## 2015-01-20 MED ORDER — HEPARIN SODIUM (PORCINE) 1000 UNIT/ML DIALYSIS: 1000.0000 [IU] | INTRAMUSCULAR | Status: DC | PRN

## 2015-01-20 MED ORDER — HEPARIN (PORCINE) IN NACL 100-0.45 UNIT/ML-% IJ SOLN
2000.0000 [IU]/h | INTRAMUSCULAR | Status: DC
  Administered 2015-01-20: 1400 [IU]/h via INTRAVENOUS
  Administered 2015-01-21: 2000 [IU]/h via INTRAVENOUS
  Administered 2015-01-21: 1800 [IU]/h via INTRAVENOUS
  Filled 2015-01-20 (×5): qty 250

## 2015-01-20 NOTE — Progress Notes (Signed)
Patient became very agitated, attempting to pull out ETT, and went in to A-Fib RVR. Called MD Sood, orders received.  Ivery QualeJackson, Xee Hollman A, RN

## 2015-01-20 NOTE — Procedures (Signed)
Extubation Procedure Note  Patient Details:   Name: [name] [name] DOB: 1961-12-17 MRN: [id number]   Airway Documentation:     Evaluation  O2 sats: stable throughout Complications: No apparent complications Patient did tolerate procedure well. Bilateral Breath Sounds: Clear, Diminished Suctioning: Airway (no sux at this time due to bathing) Yes  Patient suctioned prior to extubation orally and through ET Tube.  Audible cuff leak prior to removal of tube.  Extubated to 4lpm [place] humididfied O2 sat 99%, HR 100, RR 22, patient stated his name and has productive strong cough.  Elbert EwingsHall, Trynity Skousen [place] 01/20/2015, 10:50 AM

## 2015-01-20 NOTE — Progress Notes (Signed)
ANTICOAGULATION CONSULT NOTE - Initial Consult  Pharmacy Consult for heparin Indication: atrial fibrillation  No Known Allergies  Patient Measurements: Height: 6\' 2"  (188 cm) Weight: 285 lb 4.4 oz (129.4 kg) IBW/kg (Calculated) : 82.2 Heparin Dosing Weight: 96kg  Vital Signs: Temp: 98.4 F (36.9 C) (01/27 0800) Temp Source: Axillary (01/27 0800) BP: 101/51 mmHg (01/27 0900) Pulse Rate: 101 (01/27 0900)  Labs:  Recent Labs  01/18/15 0345 01/19/15 1100  HGB  --  9.6*  HCT  --  29.0*  PLT  --  180  CREATININE 11.89*  --     Estimated Creatinine Clearance: 10.2 mL/min (by C-G formula based on Cr of 11.89).   Medical History: Past Medical History  Diagnosis Date  . Hypertension   . End stage renal disease     a. MWF dialysis  . Arthritis   . Polysubstance abuse     a. tobacco and cocaine  . Chest pain     a. negative MV in 2004  . Abnormal ECG   . LVH (left ventricular hypertrophy)     a. 01/2013 Echo: EF 60-65%, sev concentric LVH, nl wall motion, sev dil LA.  Marland Kitchen. Hyperkalemia 10/15/2013   Assessment: 54 yo ESRD , poorly compliant with HD MWF admitted after a cardia arrest on 1/16. He had 10 minutes of CPR along with one shock applied. Intubated in the field. Hypothermia initiated on arrival and taken to Cath lab urgently.  Patient in afib this morning (CHADS 2 VASC is 2 - htn/chf) will start IV heparin and amio infusion. Patient will be a very difficult anticoagulation candidate given ESRD, compliance, and drug abuse. Hgb of 9.6 (received aranesp), pltc 180.    Goal of Therapy:  Heparin level 0.3-0.7 units/ml Monitor platelets by anticoagulation protocol: Yes   Plan:  Give 4000 units bolus x 1 Start heparin infusion at 1400 units/hr Check anti-Xa level in 8 hours and daily while on heparin Continue to monitor H&H and platelets  Follow up possible transition to oral anticoagulation  Sheppard CoilFrank Wilson PharmD., BCPS Clinical Pharmacist Pager [phone] 01/20/2015  2:23 PM

## 2015-01-20 NOTE — Progress Notes (Signed)
PULMONARY / CRITICAL CARE MEDICINE   Name: [name] [name] MRN: [id number] DOB: 1961-03-07    ADMISSION DATE:  01/09/2015   REFERRING MD :  ED  CHIEF COMPLAINT:  VT arrest  INITIAL PRESENTATION: Intubated post arrest  PATIENT SUMMARY:   54 yo ESRD , poorly compliant with HD MWF admitted after a cardia arrest on 1/16. He had 10 minutes of CPR along with one shock applied. Intubated in the field. Hypothermia initiated on arrival and taken to Cath lab urgently.  STUDIES:  1/16 CT head>> no acute abnormalities 1/18 Abdominal xray >> nonobstructive small bowel gas pattern. ? Mild colonic ileus 1/19 repeat Head ct scan >> normal  1/26: pCXR: b/l mild interstitial prominence. Subsegmental atelectasis.   SIGNIFICANT EVENTS: 1/16 VT/V F arrest 1/16 Hypothermia >>1/17 1/16 CVL right IJ >> 1/16 A-line rt radial >> 1/21 1/18 HD attempted; did not tolerate it very well due to hypotension, did have some volume removal 1/18 Consulted neurology for prognostication 1/20 Updated patient's family (cousins). Sisters not present. 1/27 afib rvr  SUBJECTIVE:  More awake, pulling at lines Fib rvr new  VITAL SIGNS: Temp:  [97.5 F (36.4 C)-98.7 F (37.1 C)] 98.4 F (36.9 C) (01/27 0800) Pulse Rate:  [56-125] 101 (01/27 0900) Resp:  [14-28] 18 (01/27 0900) BP: (91-178)/(51-101) 101/51 mmHg (01/27 0900) SpO2:  [92 %-100 %] 96 % (01/27 0900) FiO2 (%):  [40 %] 40 % (01/27 0800) HEMODYNAMICS:   VENTILATOR SETTINGS: Vent Mode:  [-] PSV;CPAP FiO2 (%):  [40 %] 40 % Set Rate:  [18 bmp] 18 bmp Vt Set:  [600 mL] 600 mL PEEP:  [5 cmH20] 5 cmH20 Pressure Support:  [5 cmH20-10 cmH20] 5 cmH20 Plateau Pressure:  [13 cmH20-15 cmH20] 15 cmH20 INTAKE / OUTPUT:  Intake/Output Summary (Last 24 hours) at 01/20/15 1007 Last data filed at 01/20/15 0800  Gross per 24 hour  Intake 1695.33 ml  Output   1050 ml  Net 645.33 ml    PHYSICAL EXAMINATION: General: agitated Neuro:  Opening eyes  spontaneously, moves all extremities post sedation, rass 1 HEENT: Left ij av graft, clean Cardiovascular:  IRT s1 s 2 Lungs:  CTA reduced bases Abdomen:  Obese, hypoactive bowel sounds, non tender Musculoskeletal:  Intact, edematous extremities Skin:  Left arm av graft x 2, protuberant pulsatile  LABS:  CBC  Recent Labs Lab 01/16/15 0445 01/17/15 0400 01/19/15 1100  WBC 8.8 8.6 8.4  HGB 10.4* 9.9* 9.6*  HCT 31.7* 31.0* 29.0*  PLT 161 171 180   BMET  Recent Labs Lab 01/16/15 0445 01/17/15 0400 01/18/15 0345  NA 139 141 141  K 4.6 4.5 4.8  CL 97 97 97  CO2 BUN 36* 69* 107*  CREATININE 8.06* 10.51* 11.89*  GLUCOSE 83 98 83   Electrolytes  Recent Labs Lab 01/16/15 0445 01/17/15 0400 01/18/15 0345  CALCIUM 10.0 10.6* 10.8*  MG 2.3  --   --   PHOS 8.4*  --   --    Sepsis Markers  Recent Labs Lab 01/18/15 1245 01/19/15 0413 01/19/15 1100 01/20/15 0400  LATICACIDVEN  --   --  1  --   PROCALCITON 2.34 1.90  --  1.69   Liver Enzymes  Recent Labs Lab 01/14/15 0415  AST 17  ALT 23  ALKPHOS 57  BILITOT 0.9  ALBUMIN 2.4*   Glucose  Recent Labs Lab 01/19/15 1413 01/19/15 1630 01/19/15 2026 01/19/15 2331 01/20/15 0412 01/20/15 0802  GLUCAP 95 99 80 86  87 85   Imaging Dg Abd 1 View  01/19/2015   CLINICAL DATA:  Distended abdomen  EXAM: ABDOMEN - 1 VIEW  COMPARISON:  01/18/2015  FINDINGS: The upper abdomen is excluded from the field of view.  There is gaseous distension of the small bowel and colon. There is no evidence of pneumoperitoneum, portal venous gas or pneumatosis. There are no pathologic calcifications along the expected course of the ureters.The osseous structures are unremarkable.  IMPRESSION: Mild gaseous distention of small bowel and colon. No evidence of bowel obstruction. This may reflect an ileus.   Electronically Signed   By: Elige KoHetal  Patel   On: 01/19/2015 11:11   Dg Chest Port 1 View  01/19/2015   CLINICAL DATA:   Intubation.  EXAM: PORTABLE CHEST - 1 VIEW  COMPARISON:  01/18/2015.  FINDINGS: Endotracheal tube tip 6.5 cm above the carina. Tip is at the thoracic inlet in unchanged position. Right IJ tube NG tube in stable position. Stable cardiomegaly with mild bilateral unchanged interstitial prominence. Bibasilar subsegmental atelectasis. No pleural effusion or pneumothorax.  IMPRESSION: 1. Endotracheal tube in unchanged position with its tip at the thoracic inlet 6.5 cm above the carina. Right IJ line and NG tube in stable position. 2. Persistent cardiomegaly with bilateral mild interstitial prominence. Subsegmental atelectasis noted in the bases. No significant change from prior exam.   Electronically Signed   By: Maisie Fushomas  Register   On: 01/19/2015 07:13   ASSESSMENT / PLAN:  PULMONARY OETT 1/16 per ems>> A: VDRF post VT arrest Hypothermia protocol completed pulm edema on admission P:   Heavy secretions - improved Would consider trach end of week if not progressing Neg balance noted by HD Maintain current MV Wean attempt cpap 5 ps 5, assess rsbi Adjust ETT down  CARDIOVASCULAR A:  Post arrest VT/VF shocked x 3 Initial HTN > resolved with cardene gtt Hx of CAD inferior Q waves and ST elevation on old ECGs Mild trop elevation 0.8 >0.78>0.69 Hypotension with HD 1/20 afib rvr 1/27 P:  ASA, lipitor Evaluate BP post HD Hep, amio per cards  RENAL A:   ESRD HD M W F Hypokalemia evolved to hyperkalemia.  Some difficulty tolerating HD due to low BP P:   Hd per renal No pressors - neg balance goals  GASTROINTESTINAL A:   GI protection 1/18 noticed increased GI secretions, ? SBO / ileus. At some risk for bowel ischemia 1/18 Abdominal xray >> nonobstructive small bowel gas pattern. ? Mild colonic ileus Ileus  P:   Cont with PPI for SUP Consider check C diff - neg NGT to suction intermittent Reglan reduction, stool increased  HEMATOLOGIC A:   Leukocytosis >> resolved  Anemia P:   hct in am  scd   INFECTIOUS A:   No clear infectious process but he does have significant purulent secretions.  Leukocytosis was likely WBC demargination No fevers since 1/18 resp 1/23 >> GNR, GPC, GNC >>abundant h flu Procalcitonin 1.9   P:   Zosyn 1/24>> 1/25 Ceftriaxone 1/25>>>add stop date 30th  pcxr follow up in am   ENDOCRINE CBG (last 3)   Recent Labs  01/19/15 2331 01/20/15 0412 01/20/15 0802  GLUCAP 86 87 85   A:   Hypoglycemia--recurrent Adrenal insuff P:   d10 @30cc , increase as TF off, ileus Monitor cbg q4h For glu issues, add stress roids  NEUROLOGIC A:   Significant anoxia, presumed brain injury but some slow interval improvement Post arrest non responsive, but exam complicated by sedation  Head ct scan 1/16 and 1/19 both nl  Somnolent today--?uremia vs. depakote ?seizure activity on admission  P:   1/18 Consulted neurology to assist with prognosis--following 1/19 EEG indicating moderate to severe cerebral dysfunction (encephalopathy).   Worsened mental status since yesterdeay- improved 1/27 Depakote for seizure prophylaxis - hold PT on vent   Depakote level pending  Ccm time 30 min  Mcarthur Rossetti. Tyson Alias, MD, FACP Pgr: [phone] [facility] 01/20/2015 10:07 AM

## 2015-01-20 NOTE — Progress Notes (Signed)
eLink Physician-Brief Progress Note Patient Name: [name] [name] DOB: 1961-08-13 MRN: [id number]   Date of Service  01/20/2015  HPI/Events of Note  Agitated.  A fib with RVR.   eICU Interventions  PRN versed.  Give diltiazem bolus, and start diltiazem gtt.       Intervention Category Major Interventions: Other:  [name] 01/20/2015, 6:46 AM

## 2015-01-20 NOTE — Progress Notes (Signed)
PROGRESS NOTE  Primary cardiologist:  Hochrein Subjective:     [name] is a 54 year old gentleman with a history of end-stage renal disease and polysubstance abuse. He had a non-ST segment elevation myocardial infarction in 2014. This was associated with cocaine abuse. He had a diagnostic heart catheterization which revealed stenosis in the diagonal branch. PCI  was not attempted because of the patient's noncompliance with dialysis and drug abuse. Head normal LV function at that time.   the patient was found unconscious at home on Jan. 16. EMS was called. He was defibrillated several  Times.  because of the prolonged down time and  question of his neurologic status, he was not cathed on admission.  His UDS was + for cocaine this admission.     Objective:    Vital Signs:   Temp:  [97.5 F (36.4 C)-98.7 F (37.1 C)] 98.4 F (36.9 C) (01/27 0800) Pulse Rate:  [56-125] 101 (01/27 0900) Resp:  [14-28] 18 (01/27 0900) BP: (91-178)/(51-101) 101/51 mmHg (01/27 0900) SpO2:  [92 %-100 %] 96 % (01/27 0900) FiO2 (%):  [40 %] 40 % (01/27 0800)  Last BM Date: 01/20/15   24-hour weight change: Weight change: -5 lb 8.2 oz (-2.5 kg)  Weight trends: Filed Weights   01/19/15 0100 01/19/15 0405 01/19/15 0824  Weight: 290 lb 12.6 oz (131.9 kg) 290 lb 12.6 oz (131.9 kg) 285 lb 4.4 oz (129.4 kg)    Intake/Output:  01/26 0701 - 01/27 0700 In: 1720 [I.V.:1540; NG/GT:130; IV Piggyback:50] Out: 3550 [Emesis/NG output:800; Stool:250] Total I/O In: 85.3 [I.V.:85.3] Out: -    Physical Exam: BP 101/51 mmHg  Pulse 101  Temp(Src) 98.4 F (36.9 C) (Axillary)  Resp 18  Ht 6\' 2"  (1.88 m)  Wt 285 lb 4.4 oz (129.4 kg)  BMI 36.61 kg/m2  SpO2 96%  Wt Readings from Last 3 Encounters:  01/19/15 285 lb 4.4 oz (129.4 kg)  09/08/14 292 lb (132.45 kg)  03/17/14 312 lb 8 oz (141.749 kg)    General: Vital signs reviewed and noted. Did not respond to questions this am.    Head:  Normocephalic, atraumatic.  Eyes: conjunctivae/corneas clear.  EOM's intact.   Throat: normal  Neck:  normal   Lungs:    clear , on vent   Heart:  RR, occasional premature beats   Abdomen:  Soft, non-tender, non-distended    Extremities: Trace - 1+ edema    Neurologic: opened his eyes but did not respond to me this am. Would not move his arms.  Almost completely  flacid on left  Psych:      Labs: BMET:  Recent Labs  01/18/15 0345  NA 141  K 4.8  CL 97  CO2 28  GLUCOSE 83  BUN 107*  CREATININE 11.89*  CALCIUM 10.8*    Liver function tests: No results for input(s): AST, ALT, ALKPHOS, BILITOT, PROT, ALBUMIN in the last 72 hours. No results for input(s): LIPASE, AMYLASE in the last 72 hours.  CBC:  Recent Labs  01/19/15 1100  WBC 8.4  HGB 9.6*  HCT 29.0*  MCV 85.8  PLT 180    Cardiac Enzymes: No results for input(s): CKTOTAL, CKMB, TROPONINI in the last 72 hours.  Coagulation Studies: No results for input(s): LABPROT, INR in the last 72 hours.  Other: Invalid input(s): POCBNP No results for input(s): DDIMER in the last 72 hours. No results for input(s): HGBA1C in the last 72 hours. No results for input(s): CHOL,  HDL, LDLCALC, TRIG, CHOLHDL in the last 72 hours.  Recent Labs  01/19/15 1100  TSH 1.313   No results for input(s): VITAMINB12, FOLATE, FERRITIN, TIBC, IRON, RETICCTPCT in the last 72 hours.   Other results:  Tele:  nSR with frequent PVCs.   Medications:    Infusions: . sodium chloride 10 mL/hr at 01/19/15 2000  . dextrose 60 mL/hr at 01/19/15 2000  . diltiazem (CARDIZEM) infusion 5 mg/hr (01/20/15 0758)    Scheduled Medications: . antiseptic oral rinse  7 mL Mouth Rinse QID  . aspirin  81 mg Oral Daily  . atorvastatin  80 mg Oral q1800  . cefTRIAXone (ROCEPHIN)  IV  2 g Intravenous Q24H  . chlorhexidine  15 mL Mouth Rinse BID  . doxercalciferol  14 mcg Intravenous Q M,W,F-HD  . feeding supplement (VITAL HIGH PROTEIN)  1,000 mL  Per Tube Q24H  . metoCLOPramide  10 mg Per Tube 4 times per day  . pantoprazole sodium  40 mg Per Tube Daily    Assessment/ Plan:      1.  Cardiac arrest:  Was associated with cocaine abuse .  The previous notes suggest that he may need an ICD.  I have concerns with his compliance and his continued cocaine use and I'm not sure that he is a candidate for ICD.  He needs to stop using cocaine or else he will be at risk for further episodes of coronary ischemia and possible cardiac arrest.   Given his lack of compliance, I do not think he is a candidate for ICD     2.  Acute respiratory failure with hypoxemia - plans per PCCM   3.   Cardiogenic shock - has resolved. Echo shows normal LV systolic function with EF of 55%   4.  Anoxic encephalopathy - improving     5.  Acute respiratory failure with hypoxia  6. ESRD:  Continue with dialysis   7. Altered mental status.   :  This is clearly a change from yesterday.  Will check blood glucose.   He may need a head CT.  Will have the nurse notify PCCM.    8. Chronic diastolic CHF:   I agree with the echo results that show chronic diastolic CHF.  On dialysis  9. Atrial fib:  He has developed atrial fib overnight .  Was started on Dilt drip. He has CHF and HTN so CHADS 2 VASC is 2 - also has ESRD which also likely increases the risk of CVA.  Will start heparin for now.   I think our best option would be to start amiodarone.  Will start IV . He is not a candidate for any of the NOACs.  Coumdin dosing may be a challenge for his given his hx of noncompliance.   Disposition: continue supportive care.  Length of Stay: 74  Vesta Mixer, Montez Hageman., MD, [facility] 01/20/2015, 9:47 AM Office [phone] Pager [phone]

## 2015-01-20 NOTE — Progress Notes (Signed)
01/20/15 18:30 pt. Went to dialysis unit per Dr. Arlean HoppingSchertz.

## 2015-01-20 NOTE — Progress Notes (Signed)
  [facility] Progress Note   Subjective: extubated this am  Filed Vitals:   01/20/15 0600 01/20/15 0700 01/20/15 0800 01/20/15 0900  BP: 127/76 123/74 102/79 101/51  Pulse: 76 125  101  Temp:   98.4 F (36.9 C)   TempSrc:   Axillary   Resp: 18 18  18   Height:      Weight:      SpO2: 98% 98%  96%   Exam: Off the vent, awake, responsive No jvd Chest clear ant and lat RRR no MRG Abd soft, obese, NTND, +BS LUA AVF +bruit No LE or UE edema  HD: [facility]WF [facility] 4.5h 500/800 129.5kg 2/2.0 [place] Heparin 9000 LUA AVF No aranesp or Fe, Hectorol 14 ug tiw       Assessment: 1. VT/VF arrest 2. Anoxic encephalopathy  3. VDRF 4. Nutrition - tube feeds 5. ESRD MWF HD 6. HTN/ vol - no meds here, at dry wt  Plan-  HD today, minimal UF    Vinson Moselleob Farrel Guimond MD  pager [phone]    cell [phone]  01/20/2015, 1:13 PM     Recent Labs Lab 01/16/15 0445 01/17/15 0400 01/18/15 0345  NA 139 141 141  K 4.6 4.5 4.8  CL 97 97 97  CO2 27 30 28   GLUCOSE 83 98 83  BUN 36* 69* 107*  CREATININE 8.06* 10.51* 11.89*  CALCIUM 10.0 10.6* 10.8*  PHOS 8.4*  --   --     Recent Labs Lab 01/14/15 0415  AST 17  ALT 23  ALKPHOS 57  BILITOT 0.9  PROT 5.8*  ALBUMIN 2.4*    Recent Labs Lab 01/16/15 0445 01/17/15 0400 01/19/15 1100  WBC 8.8 8.6 8.4  HGB 10.4* 9.9* 9.6*  HCT 31.7* 31.0* 29.0*  MCV 88.3 88.8 85.8  PLT 161 171 180   . antiseptic oral rinse  7 mL Mouth Rinse QID  . aspirin  81 mg Oral Daily  . atorvastatin  80 mg Oral q1800  . cefTRIAXone (ROCEPHIN)  IV  2 g Intravenous Q24H  . chlorhexidine  15 mL Mouth Rinse BID  . dextrose      . doxercalciferol  14 mcg Intravenous Q M,W,F-HD  . heparin  4,000 Units Intravenous Once  . hydrocortisone sod succinate (SOLU-CORTEF) inj  50 mg Intravenous 4 times per day  . pantoprazole sodium  40 mg Per Tube Daily   . sodium chloride 10 mL/hr at 01/19/15 2000  . amiodarone 60 mg/hr (01/20/15 1202)   Followed by   . amiodarone    . dextrose 60 mL/hr at 01/19/15 2000  . diltiazem (CARDIZEM) infusion 5 mg/hr (01/20/15 0758)  . heparin     sodium chloride, sodium chloride, alteplase, feeding supplement (NEPRO CARB STEADY), fentaNYL, heparin, [START ON 01/02/2015] heparin, hydrALAZINE, lidocaine (PF), lidocaine-prilocaine, midazolam, ondansetron (ZOFRAN) IV, pentafluoroprop-tetrafluoroeth

## 2015-01-21 ENCOUNTER — Inpatient Hospital Stay (HOSPITAL_COMMUNITY): Payer: Medicare Other

## 2015-01-21 ENCOUNTER — Inpatient Hospital Stay
Admission: AD | Admit: 2015-01-21 | Discharge: 2015-01-25 | Disposition: E | Payer: Self-pay | Source: Ambulatory Visit | Attending: Urology | Admitting: Urology

## 2015-01-21 DIAGNOSIS — J189 Pneumonia, unspecified organism: Secondary | ICD-10-CM

## 2015-01-21 DIAGNOSIS — E162 Hypoglycemia, unspecified: Secondary | ICD-10-CM

## 2015-01-21 DIAGNOSIS — I4891 Unspecified atrial fibrillation: Secondary | ICD-10-CM

## 2015-01-21 DIAGNOSIS — E2740 Unspecified adrenocortical insufficiency: Secondary | ICD-10-CM

## 2015-01-21 DIAGNOSIS — D649 Anemia, unspecified: Secondary | ICD-10-CM

## 2015-01-21 LAB — GLUCOSE, CAPILLARY
GLUCOSE-CAPILLARY: 47 mg/dL — AB (ref 70–99)
Glucose-Capillary: 101 mg/dL — ABNORMAL HIGH (ref 70–99)
Glucose-Capillary: 109 mg/dL — ABNORMAL HIGH (ref 70–99)
Glucose-Capillary: 73 mg/dL (ref 70–99)
Glucose-Capillary: 86 mg/dL (ref 70–99)

## 2015-01-21 LAB — RENAL FUNCTION PANEL
Albumin: 2.4 g/dL — ABNORMAL LOW (ref 3.5–5.2)
Anion gap: 11 (ref 5–15)
BUN: 35 mg/dL — AB (ref 6–23)
CO2: 28 mmol/L (ref 19–32)
CREATININE: 6.83 mg/dL — AB (ref 0.50–1.35)
Calcium: 9.7 mg/dL (ref 8.4–10.5)
Chloride: 97 mmol/L (ref 96–112)
GFR, EST AFRICAN AMERICAN: 9 mL/min — AB (ref >90)
GFR, EST NON AFRICAN AMERICAN: 8 mL/min — AB (ref >90)
Glucose, Bld: 120 mg/dL — ABNORMAL HIGH (ref 70–99)
POTASSIUM: 3.4 mmol/L — AB (ref 3.5–5.1)
Phosphorus: 5.5 mg/dL — ABNORMAL HIGH (ref 2.3–4.6)
SODIUM: 136 mmol/L (ref 135–145)

## 2015-01-21 LAB — CBC
HCT: 32.4 % — ABNORMAL LOW (ref 39.0–52.0)
Hemoglobin: 10.9 g/dL — ABNORMAL LOW (ref 13.0–17.0)
MCH: 29.5 pg (ref 26.0–34.0)
MCHC: 33.6 g/dL (ref 30.0–36.0)
MCV: 87.8 fL (ref 78.0–100.0)
Platelets: 228 10*3/uL (ref 150–400)
RBC: 3.69 MIL/uL — AB (ref 4.22–5.81)
RDW: 13.9 % (ref 11.5–15.5)
WBC: 14.2 10*3/uL — ABNORMAL HIGH (ref 4.0–10.5)

## 2015-01-21 LAB — HEPARIN LEVEL (UNFRACTIONATED): Heparin Unfractionated: 0.21 IU/mL — ABNORMAL LOW (ref 0.30–0.70)

## 2015-01-21 LAB — MAGNESIUM: Magnesium: 2.2 mg/dL (ref 1.5–2.5)

## 2015-01-21 MED ORDER — CETYLPYRIDINIUM CHLORIDE 0.05 % MT LIQD: 7.0000 mL | Freq: Two times a day (BID) | OROMUCOSAL | Status: AC

## 2015-01-21 MED ORDER — DEXTROSE 50 % IV SOLN
1.0000 | Freq: Once | INTRAVENOUS | Status: AC
  Administered 2015-01-21: 50 mL via INTRAVENOUS

## 2015-01-21 MED ORDER — HYDRALAZINE HCL 20 MG/ML IJ SOLN: 10.0000 mg | Freq: Four times a day (QID) | INTRAMUSCULAR | Status: AC | PRN

## 2015-01-21 MED ORDER — NEPRO/CARBSTEADY PO LIQD: 237.0000 mL | Freq: Two times a day (BID) | ORAL | Status: AC

## 2015-01-21 MED ORDER — LORAZEPAM 2 MG/ML IJ SOLN
INTRAMUSCULAR | Status: AC
Start: 2015-01-21 — End: 2015-01-21
  Filled 2015-01-21: qty 1

## 2015-01-21 MED ORDER — CHLORHEXIDINE GLUCONATE 0.12 % MT SOLN: 15.0000 mL | Freq: Two times a day (BID) | OROMUCOSAL | Status: AC

## 2015-01-21 MED ORDER — ONDANSETRON HCL 4 MG/2ML IJ SOLN: 4.0000 mg | Freq: Four times a day (QID) | INTRAMUSCULAR | Status: AC | PRN

## 2015-01-21 MED ORDER — HYDROCORTISONE NA SUCCINATE PF 100 MG IJ SOLR: 50.0000 mg | Freq: Four times a day (QID) | INTRAMUSCULAR | Status: AC

## 2015-01-21 MED ORDER — LORAZEPAM 2 MG/ML IJ SOLN
1.0000 mg | Freq: Once | INTRAMUSCULAR | Status: AC
  Administered 2015-01-21: 1 mg via INTRAVENOUS

## 2015-01-21 MED ORDER — HEPARIN (PORCINE) IN NACL 100-0.45 UNIT/ML-% IJ SOLN: 2000.0000 [IU]/h | INTRAMUSCULAR | Status: AC

## 2015-01-21 MED ORDER — HEPARIN BOLUS VIA INFUSION
3000.0000 [IU] | Freq: Once | INTRAVENOUS | Status: AC
  Administered 2015-01-21: 3000 [IU] via INTRAVENOUS
  Filled 2015-01-21: qty 3000

## 2015-01-21 MED ORDER — AMIODARONE HCL IN DEXTROSE 360-4.14 MG/200ML-% IV SOLN: 30.0000 mg/h | INTRAVENOUS | Status: AC

## 2015-01-21 MED ORDER — LORAZEPAM 2 MG/ML IJ SOLN: 1.0000 mg | INTRAMUSCULAR | Status: DC | PRN

## 2015-01-21 MED ORDER — CHLORHEXIDINE GLUCONATE 0.12 % MT SOLN: 15.0000 mL | Freq: Two times a day (BID) | OROMUCOSAL | Status: DC

## 2015-01-21 MED ORDER — DILTIAZEM HCL 100 MG IV SOLR: 5.0000 mg/h | INTRAVENOUS | Status: AC

## 2015-01-21 MED ORDER — FENTANYL CITRATE 0.05 MG/ML IJ SOLN: 25.0000 ug | INTRAMUSCULAR | Status: AC | PRN

## 2015-01-21 MED ORDER — LORAZEPAM 2 MG/ML IJ SOLN: 1.0000 mg | INTRAMUSCULAR | Status: AC | PRN

## 2015-01-21 MED ORDER — CETYLPYRIDINIUM CHLORIDE 0.05 % MT LIQD
7.0000 mL | Freq: Two times a day (BID) | OROMUCOSAL | Status: DC
  Administered 2015-01-21: 7 mL via OROMUCOSAL

## 2015-01-21 MED ORDER — ASPIRIN 81 MG PO CHEW: 81.0000 mg | CHEWABLE_TABLET | Freq: Every day | ORAL | Status: AC

## 2015-01-21 MED ORDER — DOXERCALCIFEROL 4 MCG/2ML IV SOLN: 14.0000 ug | INTRAVENOUS | Status: AC

## 2015-01-21 MED ORDER — ATORVASTATIN CALCIUM 80 MG PO TABS: 80.0000 mg | ORAL_TABLET | Freq: Every day | ORAL | Status: AC

## 2015-01-21 MED ORDER — DEXTROSE 50 % IV SOLN
INTRAVENOUS | Status: AC
  Filled 2015-01-21: qty 50

## 2015-01-21 MED ORDER — NEPRO/CARBSTEADY PO LIQD
237.0000 mL | Freq: Two times a day (BID) | ORAL | Status: DC
  Administered 2015-01-21: 237 mL via ORAL
  Filled 2015-01-21 (×3): qty 237

## 2015-01-21 MED ORDER — CEFTRIAXONE SODIUM IN DEXTROSE 40 MG/ML IV SOLN: 2.0000 g | INTRAVENOUS | Status: AC

## 2015-01-21 MED ORDER — DEXTROSE 10 % IV SOLN: INTRAVENOUS | Status: AC

## 2015-01-21 NOTE — Progress Notes (Signed)
ANTICOAGULATION CONSULT NOTE  Pharmacy Consult for heparin Indication: atrial fibrillation  No Known Allergies  Patient Measurements: Height: 6\' 2"  (188 cm) Weight: 282 lb 3 oz (128 kg) IBW/kg (Calculated) : 82.2 Heparin Dosing Weight: 96kg  Vital Signs: Temp: 97.6 F (36.4 C) (01/28 1200) Temp Source: Oral (01/28 1200) BP: 163/68 mmHg (01/28 1300) Pulse Rate: 79 (01/28 1300)  Labs:  Recent Labs  01/19/15 1100 01/20/15 1847 16-Jun-2015 0027 16-Jun-2015 0430 16-Jun-2015 1000  HGB 9.6* 11.0*  --  10.9*  --   HCT 29.0* 32.4*  --  32.4*  --   PLT 180 224  --  228  --   HEPARINUNFRC  --   --  <0.10*  --  0.21*  CREATININE  --  10.24*  --  6.83*  --     Estimated Creatinine Clearance: 17.6 mL/min (by C-G formula based on Cr of 6.83).   Medical History: Past Medical History  Diagnosis Date  . Hypertension   . End stage renal disease     a. MWF dialysis  . Arthritis   . Polysubstance abuse     a. tobacco and cocaine  . Chest pain     a. negative MV in 2004  . Abnormal ECG   . LVH (left ventricular hypertrophy)     a. 01/2013 Echo: EF 60-65%, sev concentric LVH, nl wall motion, sev dil LA.  Marland Kitchen. Hyperkalemia 10/15/2013   Assessment: 54 yo ESRD , poorly compliant with HD MWF admitted after a cardia arrest on 1/16. He had 10 minutes of CPR along with one shock applied. Intubated in the field. Hypothermia initiated on arrival and taken to Cath lab urgently.  Patient in afib 1/27 (CHADS 2 VASC is 2 - htn/chf) started IV heparin and amio infusion. Patient will be a very difficult anticoagulation candidate given ESRD, compliance, and drug abuse. Hgb of 9.6 (received aranesp), pltc 180  Converted to NSR overnight. Heparin to continue for now, level low this morning. No bleeding issues noted.  Goal of Therapy:  Heparin level 0.3-0.7 units/ml Monitor platelets by anticoagulation protocol: Yes   Plan:  Increase heparin gtt to 2000 units/hr Recheck heparin level tonight Daily  CBC/HL  Sheppard CoilFrank Wilson PharmD., BCPS Clinical Pharmacist Pager [phone] 02-25-2015 2:18 PM

## 2015-01-21 NOTE — Progress Notes (Signed)
NUTRITION FOLLOW UP  Intervention:   Nepro Shake po BID, each supplement provides 425 kcal and 19 grams protein  Nutrition Dx:   Inadequate oral intake related to dysphagia as evidenced by dysphagia diet, ongoing.  Goal:   Enteral nutrition to provide 60-70% of estimated calorie needs (22-25 kcals/kg ideal body weight) and 100% of estimated protein needs, based on ASPEN guidelines for hypocaloric, high protein feeding in critically ill obese individuals, NA  Goal:  Pt to meet >/= 90% of their estimated nutrition needs   Monitor:   Diet advancement, PO intake, weight trend, labs  Assessment:   54 yo ESRD , poorly compliant with HD MWF who noted to be slumped over at home. EMS activated and 10 minutes of CPR along with one shock applied. Intubated in the field and [facility] and his deemed a hypothermia candidate and taken to Cath lab urgently.  Pt extubated 1/27 and started on dysphagia diet 1/28. Spoke with RN, unable to understand pt. Rehab consult pending.  Phosphorus elevated, Potassium low.   Height: Ht Readings from Last 1 Encounters:  01/19/15 6\' 2"  (1.88 m)    Weight Status:   Wt Readings from Last 1 Encounters:  01/20/15 282 lb 3 oz (128 kg)   01/12/15 286 lb 9.6 oz (130 kg)    Re-estimated needs:  Kcal: 2500-2700 Protein: 140-150 gm Fluid: >/= 1.2 L/day  Skin: intact  Diet Order: DIET DYS 2, Nectar   Intake/Output Summary (Last 24 hours) at 12/27/2014 1512 Last data filed at 01/06/2015 1200  Gross per 24 hour  Intake 2005.51 ml  Output    100 ml  Net 1905.51 ml    Last BM: 1/28   Labs:   Recent Labs Lab 01/16/15 0445  01/18/15 0345 01/20/15 1847 01/19/2015 0430  NA 139  < > 141 135 136  K 4.6  < > 4.8 3.6 3.4*  CL 97  < > 97 94* 97  CO2 27  < > 28 25 28   BUN 36*  < > 107* 70* 35*  CREATININE 8.06*  < > 11.89* 10.24* 6.83*  CALCIUM 10.0  < > 10.8* 10.0 9.7  MG 2.3  --   --   --  2.2  PHOS 8.4*  --   --  8.7* 5.5*  GLUCOSE 83  <  > 83 119* 120*  < > = values in this interval not displayed.  CBG (last 3)   Recent Labs  12/31/2014 0428 01/06/2015 0821 01/11/2015 1112  GLUCAP 101* 73 109*    Scheduled Meds: . antiseptic oral rinse  7 mL Mouth Rinse BID  . aspirin  81 mg Oral Daily  . atorvastatin  80 mg Oral q1800  . cefTRIAXone (ROCEPHIN)  IV  2 g Intravenous Q24H  . chlorhexidine  15 mL Mouth Rinse BID  . doxercalciferol  14 mcg Intravenous Q M,W,F-HD  . hydrocortisone sod succinate (SOLU-CORTEF) inj  50 mg Intravenous 4 times per day  . LORazepam        Continuous Infusions: . sodium chloride 10 mL/hr at 01/16/2015 0700  . amiodarone 30 mg/hr (01/22/2015 1430)  . dextrose 60 mL/hr at 01/01/2015 0700  . diltiazem (CARDIZEM) infusion 5 mg/hr (01/07/2015 0700)  . heparin 2,000 Units/hr (12/30/2014 1427)    Kendell BaneHeather Philipe Laswell RD, LDN, CNSC [phone] Pager [phone] After Hours Pager

## 2015-01-21 NOTE — Progress Notes (Signed)
ANTICOAGULATION CONSULT NOTE - Initial Consult  Pharmacy Consult for heparin Indication: atrial fibrillation  No Known Allergies  Patient Measurements: Height: 6\' 2"  (188 cm) Weight: 282 lb 3 oz (128 kg) IBW/kg (Calculated) : 82.2 Heparin Dosing Weight: 96kg  Vital Signs: Temp: 97.3 F (36.3 C) (01/28 0000) Temp Source: Oral (01/28 0000) BP: 152/74 mmHg (01/28 0100) Pulse Rate: 81 (01/28 0100)  Labs:  Recent Labs  01/18/15 0345 01/19/15 1100 01/20/15 1847 01/11/2015 0027  HGB  --  9.6* 11.0*  --   HCT  --  29.0* 32.4*  --   PLT  --  180 224  --   HEPARINUNFRC  --   --   --  <0.10*  CREATININE 11.89*  --  10.24*  --     Estimated Creatinine Clearance: 11.7 mL/min (by C-G formula based on Cr of 10.24).   Medical History: Past Medical History  Diagnosis Date  . Hypertension   . End stage renal disease     a. MWF dialysis  . Arthritis   . Polysubstance abuse     a. tobacco and cocaine  . Chest pain     a. negative MV in 2004  . Abnormal ECG   . LVH (left ventricular hypertrophy)     a. 01/2013 Echo: EF 60-65%, sev concentric LVH, nl wall motion, sev dil LA.  Marland Kitchen. Hyperkalemia 10/15/2013   Assessment: 54 yo ESRD , poorly compliant with HD MWF admitted after a cardia arrest on 1/16. He had 10 minutes of CPR along with one shock applied. Intubated in the field. Hypothermia initiated on arrival and taken to Cath lab urgently.  Patient in afib this morning (CHADS 2 VASC is 2 - htn/chf) will start IV heparin and amio infusion. Patient will be a very difficult anticoagulation candidate given ESRD, compliance, and drug abuse. Hgb of 9.6 (received aranesp), pltc 180  First HL is resulting late. It is undetectable at <0.1. No infusion issues reported.     Goal of Therapy:  Heparin level 0.3-0.7 units/ml Monitor platelets by anticoagulation protocol: Yes   Plan:   Repeat bolus of 3000 unitsx1 then increase rate to 1800 units/hr 8 hr HL  Janice CoffinEarl, Kala Gassmann Jonathan

## 2015-01-21 NOTE — Progress Notes (Signed)
Hypoglycemic Event  CBG: 47  Treatment: D50 IV 50 mL  Symptoms: AMS  Follow-up CBG: Time:12:35am CBG Result:87   Possible Reasons for Event: Medication regimen: pt was in HD---- when pt returned to room, D10 gtt rate at 5 ml/hr, ordered dose that pt sent on 60 ml/hr. D10 bag had 10ml fluid left.     Charlann BoxerBurnham, Janita Camberos M  Remember to initiate Hypoglycemia Order Set & complete

## 2015-01-21 NOTE — Progress Notes (Signed)
PROGRESS NOTE  Primary cardiologist:  Hochrein Subjective:     [name] is a 54 year old gentleman with a history of end-stage renal disease and polysubstance abuse. He had a non-ST segment elevation myocardial infarction in 2014. This was associated with cocaine abuse. He had a diagnostic heart catheterization which revealed stenosis in the diagonal branch. PCI  was not attempted because of the patient's noncompliance with dialysis and drug abuse. Had normal LV function at that time.   the patient was found unconscious at home on Jan. 16. EMS was called. He was defibrillated several  Times.  because of the prolonged down time and  question of his neurologic status, he was not cathed on admission.  His UDS was + for cocaine this admission.  He is making slow but steady improvement from a neuro standpoint.    Objective:    Vital Signs:   Temp:  [97.3 F (36.3 C)-98.8 F (37.1 C)] 98.2 F (36.8 C) (01/28 0800) Pulse Rate:  [74-105] 85 (01/28 0800) Resp:  [18-36] 29 (01/28 0800) BP: (99-179)/(51-102) 151/98 mmHg (01/28 0800) SpO2:  [90 %-100 %] 90 % (01/28 0800) Weight:  [282 lb 3 oz (128 kg)] 282 lb 3 oz (128 kg) (01/27 2248)  Last BM Date: 2015/01/28   24-hour weight change: Weight change: -3 lb 1.4 oz (-1.4 kg)  Weight trends: Filed Weights   01/19/15 0824 01/20/15 1800 01/20/15 2248  Weight: 285 lb 4.4 oz (129.4 kg) 282 lb 3 oz (128 kg) 282 lb 3 oz (128 kg)    Intake/Output:  01/27 0701 - 01/28 0700 In: 1982 [I.V.:1932; IV Piggyback:50] Out: 100 [Stool:100]     Physical Exam: BP 151/98 mmHg  Pulse 85  Temp(Src) 98.2 F (36.8 C) (Oral)  Resp 29  Ht  (1.88 m)  Wt 282 lb 3 oz (128 kg)  BMI 36.22 kg/m2  SpO2 90%  Wt Readings from Last 3 Encounters:  01/20/15 282 lb 3 oz (128 kg)  09/08/14 292 lb (132.45 kg)  03/17/14 312 lb 8 oz (141.749 kg)    General: Vital signs reviewed and noted. Rolled over in response to my voice today     Head:  Normocephalic, atraumatic.  Eyes: conjunctivae/corneas clear.  EOM's intact.   Throat: normal  Neck:  normal   Lungs:    clear   Heart: RR,  , occasional premature beats   Abdomen/ GI :  Soft, non-tender, non-distended   Rectal tube in place   Extremities: Trace - 1+ edema    Neurologic: opened his eyes   Psych:  responds to voice , touch     Labs: BMET:  Recent Labs  01/20/15 1847 01-28-2015 0430  NA 135 136  K 3.6 3.4*  CL 94* 97  CO2 25 28  GLUCOSE 119* 120*  BUN 70* 35*  CREATININE 10.24* 6.83*  CALCIUM 10.0 9.7  MG  --  2.2  PHOS 8.7* 5.5*    Liver function tests:  Recent Labs  01/20/15 1847 January 28, 2015 0430  ALBUMIN 2.3* 2.4*   No results for input(s): LIPASE, AMYLASE in the last 72 hours.  CBC:  Recent Labs  01/20/15 1847 2015/01/28 0430  WBC 14.7* 14.2*  HGB 11.0* 10.9*  HCT 32.4* 32.4*  MCV 85.5 87.8  PLT 224 228    Cardiac Enzymes: No results for input(s): CKTOTAL, CKMB, TROPONINI in the last 72 hours.  Coagulation Studies: No results for input(s): LABPROT, INR in the last 72 hours.  Other:  Invalid input(s): POCBNP No results for input(s): DDIMER in the last 72 hours. No results for input(s): HGBA1C in the last 72 hours. No results for input(s): CHOL, HDL, LDLCALC, TRIG, CHOLHDL in the last 72 hours.  Recent Labs  01/19/15 1100  TSH 1.313   No results for input(s): VITAMINB12, FOLATE, FERRITIN, TIBC, IRON, RETICCTPCT in the last 72 hours.   Other results:  Tele:  NSR with frequent PVCs.   Medications:    Infusions: . sodium chloride 10 mL/hr at 06/27/15 0000  . amiodarone 30 mg/hr (06/27/15 0220)  . dextrose 60 mL/hr at 06/27/15 0000  . diltiazem (CARDIZEM) infusion 5 mg/hr (06/27/15 0000)  . heparin 1,800 Units/hr (06/27/15 0215)    Scheduled Medications: . antiseptic oral rinse  7 mL Mouth Rinse BID  . aspirin  81 mg Oral Daily  . atorvastatin  80 mg Oral q1800  . cefTRIAXone (ROCEPHIN)  IV  2 g Intravenous Q24H  .  chlorhexidine  15 mL Mouth Rinse BID  . doxercalciferol  14 mcg Intravenous Q M,W,F-HD  . hydrocortisone sod succinate (SOLU-CORTEF) inj  50 mg Intravenous 4 times per day  . pantoprazole sodium  40 mg Per Tube Daily    Assessment/ Plan:      1.  Cardiac arrest:  Was associated with cocaine abuse .  The previous notes suggest that he may need an ICD.  I have concerns with his compliance and his continued cocaine use and I'm not sure that he is a candidate for ICD.  He needs to stop using cocaine or else he will be at risk for further episodes of coronary ischemia and possible cardiac arrest.   Given his lack of compliance, I do not think he is a candidate for ICD     2.  Acute respiratory failure with hypoxemia - plans per PCCM   3.   Cardiogenic shock - has resolved. Echo shows normal LV systolic function with EF of 55%   4.  Anoxic encephalopathy - improving     5.  Acute respiratory failure with hypoxia  6. ESRD:  Continue with dialysis   7. Altered mental status.   :  This is clearly a change from yesterday.  Will check blood glucose.   He may need a head CT.  Will have the nurse notify PCCM.    8. Chronic diastolic CHF:   I agree with the echo results that show chronic diastolic CHF.  On dialysis  9. Atrial fib:  He has developed atrial fib yesterday but has converted back to NSR this am.   He has CHF and HTN so CHADS 2 VASC is 2 - also has ESRD which also likely increases the risk of CVA.  On IV dilt , amio.  He is not a candidate for any of the NOACs.  Coumdin dosing may be a challenge for his given his hx of noncompliance.   Disposition: continue supportive care.  Length of Stay: 12  Vesta MixerPhilip J. Shenetta Schnackenberg, Montez HagemanJr., MD, [facility] Feb 24, 2015, 8:38 AM Office [phone] Pager [phone]

## 2015-01-21 NOTE — Progress Notes (Signed)
PULMONARY / CRITICAL CARE MEDICINE   Name: [name] [name] MRN: [id number] DOB: Sep 13, 1961    ADMISSION DATE:  01/09/2015   REFERRING MD :  ED  CHIEF COMPLAINT:  VT arrest  INITIAL PRESENTATION: Intubated post arrest  PATIENT SUMMARY:   54 yo ESRD , poorly compliant with HD MWF admitted after a cardia arrest on 1/16. He had 10 minutes of CPR along with one shock applied. Intubated in the field. Hypothermia initiated on arrival and taken to Cath lab urgently.  STUDIES:  1/16 CT head>> no acute abnormalities 1/18 Abdominal xray >> nonobstructive small bowel gas pattern. ? Mild colonic ileus 1/19 repeat Head ct scan >> normal  1/26: pCXR: b/l mild interstitial prominence. Subsegmental atelectasis.  1/28: pCXR: slight improvement in edema  SIGNIFICANT EVENTS: 1/16 VT/V F arrest 1/16 Hypothermia >>1/17 1/16 CVL right IJ >> 1/16 A-line rt radial >> 1/21 1/18 HD attempted; did not tolerate it very well due to hypotension, did have some volume removal 1/18 Consulted neurology for prognostication 1/20 Updated patient's family (cousins). Sisters not present. 1/27 afib RVR, extubated  SUBJECTIVE:  Confused and sleepy today, no more vomiting  VITAL SIGNS: Temp:  [97.3 F (36.3 C)-98.8 F (37.1 C)] 98.2 F (36.8 C) (01/28 0800) Pulse Rate:  [74-105] 85 (01/28 0800) Resp:  [18-36] 29 (01/28 0800) BP: (99-179)/(51-102) 151/98 mmHg (01/28 0800) SpO2:  [90 %-100 %] 90 % (01/28 0800) Weight:  [282 lb 3 oz (128 kg)] 282 lb 3 oz (128 kg) (01/27 2248) HEMODYNAMICS:   VENTILATOR SETTINGS:   INTAKE / OUTPUT:  Intake/Output Summary (Last 24 hours) at 2015-02-09 0833 Last data filed at 02-09-15 0600  Gross per 24 hour  Intake 1896.64 ml  Output    100 ml  Net 1796.64 ml   PHYSICAL EXAMINATION: General: lying in bed on his side, feeling cold, not cooperative with exam, moving and talking alot Neuro:  Arousable, confused, but knows name and DOB, no distress HEENT:  EOMI Cardiovascular:  RRR Lungs:  Decreased breath sounds in bases Abdomen:  Obese, non-tender, soft Musculoskeletal:  Intact, edematous extremities Skin:  Left arm av graft x 2, protuberant pulsatile  LABS:  CBC  Recent Labs Lab 01/19/15 1100 01/20/15 1847 02/09/15 0430  WBC 8.4 14.7* 14.2*  HGB 9.6* 11.0* 10.9*  HCT 29.0* 32.4* 32.4*  PLT 180 224 228   BMET  Recent Labs Lab 01/18/15 0345 01/20/15 1847 2015-02-09 0430  NA 141 135 136  K 4.8 3.6 3.4*  CL 97 94* 97  CO2 BUN 107* 70* 35*  CREATININE 11.89* 10.24* 6.83*  GLUCOSE 83 119* 120*   Electrolytes  Recent Labs Lab 01/16/15 0445  01/18/15 0345 01/20/15 1847 2015/02/09 0430  CALCIUM 10.0  < > 10.8* 10.0 9.7  MG 2.3  --   --   --  2.2  PHOS 8.4*  --   --  8.7* 5.5*  < > = values in this interval not displayed. Sepsis Markers  Recent Labs Lab 01/18/15 1245 01/19/15 0413 01/19/15 1100 01/20/15 0400  LATICACIDVEN  --   --  1  --   PROCALCITON 2.34 1.90  --  1.69   Liver Enzymes  Recent Labs Lab 01/20/15 1847 Feb 09, 2015 0430  ALBUMIN 2.3* 2.4*   Glucose  Recent Labs Lab 01/20/15 1335 01/20/15 1410 01/20/15 1735 02-09-2015 February 09, 2015 0047 02/09/2015 0428  GLUCAP 55* 72 74 47* 86 101*   Imaging Dg Chest Port 1 View  01/20/2015   CLINICAL DATA:  Polysubstance abuse. End-stage renal disease. Pulled out endotracheal tube.  EXAM: PORTABLE CHEST - 1 VIEW  COMPARISON:  01/19/2015  FINDINGS: Endotracheal tube is no longer visible. The nasogastric tube extends below the diaphragm and off the inferior edge of the image. The right jugular central line is unchanged in position with tip in the expected location of the SVC -azygos vein junction.  There is no pneumothorax. Mild interstitial thickening or fluid persists without significant interval change.  IMPRESSION: Extubated.  No other significant interval change.   Electronically Signed   By: Ellery Plunkaniel R Mitchell M.D.   On: 01/20/2015 09:06   ASSESSMENT  / PLAN:  PULMONARY OETT 1/16 per ems>>1/27  A: VDRF post VT arrest Hypothermia protocol completed pulm edema on admission--improving P:   On 3L [place], weaning down IS as able Upright Hd per renal  CARDIOVASCULAR A:  Post arrest VT/VF shocked x 3 Initial HTN > resolved with cardene gtt Hx of CAD inferior Q waves and ST elevation on old ECGs Mild trop elevation 0.8 >0.78>0.69 Hypotension with HD 1/20 afib rvr 1/27--appears to be back in SR this morning P:  ASA, lipitor Hep, amio per cards EKG this AM, for SR now  RENAL A:   ESRD HD M W F Hypokalemia Some difficulty tolerating HD due to low BP P:   Hd per renal, went again yesterday No pressors - neg balance goals  GASTROINTESTINAL A:   GI protection 1/18 noticed increased GI secretions, ? SBO / ileus. At some risk for bowel ischemia 1/18 Abdominal xray >> nonobstructive small bowel gas pattern. ? Mild colonic ileus Ileus  P:   D/c PPI C diff - neg D/c reglan 1/27 D/c rectal tube if possible  HEMATOLOGIC A:   Leukocytosis >> improving Anemia P:  Trend cbc Heparin gtt, to coumadin per cards when appropriate   INFECTIOUS A:   No clear infectious process but he does have significant purulent secretions.  Leukocytosis trending down No fevers since 1/18 resp 1/23 >> GNR, GPC, GNC >>abundant h flu Procalcitonin 1.9   P:   Zosyn 1/24>> 1/25 Ceftriaxone 1/25>>>add stop date 30th  ENDOCRINE CBG (last 3)   Recent Labs  01/09/2015 01/22/2015 0047 01/04/2015 0428  GLUCAP 47* 86 101*   A:   Hypoglycemia--recurrent Adrenal insuff P:   d10 @60 , check LFt further SLP, if fails NGt and feeds Monitor cbg q4h Cortisol 13.9 Solucortef 50mg  q6h   NEUROLOGIC A:   Significant anoxia, presumed brain injury but some slow interval improvement Post arrest Head ct scan 1/16 and 1/19 both nl  Somnolent improved after Depakote hold Not clearly with seziures  P:   1/18 Consulted neurology to assist with  prognosis--following 1/19 EEG indicating moderate to severe cerebral dysfunction (encephalopathy).   Confused this AM Depakote for seizure prophylaxis - hold Valproic acid 47.7 PT  Swallow eval Try to get to chair D/w neuro, dc Depakote all together  Signed: Darden PalmerSamaya Qureshi, MD PGY-3, Internal Medicine Resident Pager: [phone]  01/02/2015,8:43 AM  STAFF NOTE: Cindi CarbonI, Daniel Feinstein, MD FACP have personally reviewed patient's available data, including medical history, events of note, physical examination and test results as part of my evaluation. I have discussed with resident/NP and other care providers such as pharmacist, RN and RRT. In addition, I personally evaluated patient and elicited key findings of: to sdu, hd per renal, neurostatus ok, coumadin per cards soon, dc depakote all together, to triad above and that I agree with   Mcarthur Rossettianiel J. Tyson AliasFeinstein, MD, Jerrel IvoryFACP  Pgr: [phone] [facility] 01/04/2015 10:49 AM

## 2015-01-21 NOTE — Care Management Note (Signed)
    Page 1 of 1   02/21/2015     11:29:16 AM CARE MANAGEMENT NOTE 02/21/2015  Patient:  [name] [name],[name] [name]   Account Number:  [id number]  Date Initiated:  01/13/2015  Documentation initiated by:  Junius CreamerWELL,DEBBIE  Subjective/Objective Assessment:   adm w cardiac arrest-vent     Action/Plan:   lives w friend, supp sisters   Anticipated DC Date:     Anticipated DC Plan:           Choice offered to / List presented to:             Status of service:   Medicare Important Message given?  YES (If response is "NO", the following Medicare IM given date fields will be blank) Date Medicare IM given:  002/28/2016 Medicare IM given by:  Junius CreamerWELL,DEBBIE Date Additional Medicare IM given:   Additional Medicare IM given by:    Discharge Disposition:    Per UR Regulation:  Reviewed for med. necessity/level of care/duration of stay  If discussed at Long Length of Stay Meetings, dates discussed:   01/14/2015  01/19/2015  02/21/2015    Comments:

## 2015-01-21 NOTE — Evaluation (Signed)
Clinical/Bedside Swallow Evaluation Patient Details  Name: [name] [name] MRN: [id number] Date of Birth: Feb 11, 1961  Today's Date: January 25, 2015 Time: SLP Start Time (ACUTE ONLY): 1342 SLP Stop Time (ACUTE ONLY): 1411 SLP Time Calculation (min) (ACUTE ONLY): 29 min  Past Medical History:  Past Medical History  Diagnosis Date  . Hypertension   . End stage renal disease     a. MWF dialysis  . Arthritis   . Polysubstance abuse     a. tobacco and cocaine  . Chest pain     a. negative MV in 2004  . Abnormal ECG   . LVH (left ventricular hypertrophy)     a. 01/2013 Echo: EF 60-65%, sev concentric LVH, nl wall motion, sev dil LA.  Marland Kitchen Hyperkalemia 10/15/2013   Past Surgical History:  Past Surgical History  Procedure Laterality Date  . Av fistula placement      Left x 2  . Cholecystectomy    . Total knee arthroplasty  09/20/2012    Procedure: TOTAL KNEE ARTHROPLASTY;  Surgeon: [name], MD;  Location: [facility] OR;  Service: Orthopedics;  Laterality: Right;  right total knee arthroplasty  . Joint replacement Right 09/20/12  . Left heart catheterization with coronary angiogram N/A 05/29/2013    Procedure: LEFT HEART CATHETERIZATION WITH CORONARY ANGIOGRAM;  Surgeon: Tonny Bollman, MD;  Location: [facility] CATH LAB;  Service: Cardiovascular;  Laterality: N/A;  . Left heart cath Bilateral 01/09/2015    Procedure: LEFT HEART CATH;  Surgeon: Corky Crafts, MD;  Location: [facility] CATH LAB;  Service: Cardiovascular;  Laterality: Bilateral;   HPI:  Pt adm after cardiac arrest. Pt + for cocaine. Pt with VDRF. PMH - ESRD on HD (poor compliance), polysubstance abuse, MI, CHF   Assessment / Plan / Recommendation Clinical Impression  Pt's oropharyngeal swallow function is currently limited due to cognitive status as well as likely acute, reversible dysphagia s/p prolonged intubation. Pt's vocal quality is strong and clear at baseline, and remains clear throughout all trials. Immediate coughing is noted s/p sips  of thin liquid and bites of Dys 3 textures, suspect due to premature spillage. Pt requires Mod cues from SLP to sustain attention to mastication, with Max-Total A from SLP for emergent awareness of difficulties with self-feeding. Pt consumed 4 oz of nectar thick liquids and several bites of Dys 2 textures without immediate signs of complication, although with one delayed cough after PO intake had ceased.   Recommend to initiate Dys 2 diet and nectar thick liquids with no straws and full supervision to reduce aspiration risk at this time. SLP to follow closely for tolerance with advancement versus objective testing as warranted. Pt would also benefit from SLP cognitive-linguistic evaluation - MD, please order if agree.    Aspiration Risk  Moderate    Diet Recommendation Dysphagia 2 (Fine chop);Nectar-thick liquid   Liquid Administration via: Cup;No straw Medication Administration: Whole meds with puree Supervision: Staff to assist with self feeding;Full supervision/cueing for compensatory strategies Compensations: Slow rate;Small sips/bites Postural Changes and/or Swallow Maneuvers: Seated upright 90 degrees    Other  Recommendations Oral Care Recommendations: Oral care BID Other Recommendations: Order thickener from pharmacy;Prohibited food (jello, ice cream, thin soups);Remove water pitcher   Follow Up Recommendations  Inpatient Rehab;24 hour supervision/assistance    Frequency and Duration min 2x/week  2 weeks   Pertinent Vitals/Pain [facility]    SLP Swallow Goals     Swallow Study Prior Functional Status  Type of Home: Apartment    General HPI:  Pt adm after cardiac arrest. Pt + for cocaine. Pt with VDRF. PMH - ESRD on HD (poor compliance), polysubstance abuse, MI, CHF Type of Study: Bedside swallow evaluation Previous Swallow Assessment: none in chart Diet Prior to this Study: NPO Temperature Spikes Noted: No Respiratory Status: Room air History of Recent Intubation: Yes Length  of Intubations (days): 12 days Date extubated: 01/20/15 Behavior/Cognition: Alert;Requires cueing;Other (comment) (impaired awareness) Oral Cavity - Dentition: Adequate natural dentition Self-Feeding Abilities: Needs assist Patient Positioning: Upright in bed Baseline Vocal Quality: Clear    Oral/Motor/Sensory Function Overall Oral Motor/Sensory Function: Appears within functional limits for tasks assessed   Ice Chips Ice chips: Within functional limits Presentation: Spoon   Thin Liquid Thin Liquid: Impaired Presentation: Straw Pharyngeal  Phase Impairments: Suspected delayed Swallow;Cough - Immediate    Nectar Thick Nectar Thick Liquid: Impaired Presentation: Straw Pharyngeal Phase Impairments: Suspected delayed Swallow;Cough - Delayed (delayed cough x1)   Honey Thick Honey Thick Liquid: Not tested   Puree Puree: Impaired Presentation: Spoon Oral Phase Impairments: Poor awareness of bolus Oral Phase Functional Implications: Oral holding Pharyngeal Phase Impairments: Suspected delayed Swallow   Solid   GO    Solid: Impaired Oral Phase Impairments: Poor awareness of bolus Pharyngeal Phase Impairments: Cough - Immediate;Other (comments) (suspect premature spillage)       Maxcine HamLaura Paiewonsky, M.A. CCC-SLP [phone]  [name], [name] 26-Feb-2015,2:25 PM

## 2015-01-21 NOTE — Discharge Summary (Signed)
Critical Care Discharge Summary  Name: [name] [name] MRN: [id number] DOB: 26-Nov-1961 54 y.o. PCP: Pcp Not In System  Date of Admission: 01/09/2015  1:04 PM Date of Discharge: 2015-01-24 Attending Physician: Alyson Reedy, MD  Discharge Diagnosis: Active Problems:   Cardiac arrest   Acute respiratory failure with hypoxemia   Cardiogenic shock   Anoxic encephalopathy   Altered mental status   DNR (do not resuscitate)   Acute respiratory failure with hypoxia   Acute respiratory failure  Discharge Medications:   Medication List    STOP taking these medications        acetaminophen 650 MG CR tablet  Commonly known as:  TYLENOL     amLODipine 5 MG tablet  Commonly known as:  NORVASC      TAKE these medications        amiodarone 360 MG/200ML Soln  Commonly known as:  NEXTERONE PREMIX  Inject 16.67 mL/hr into the vein continuous.     antiseptic oral rinse 0.05 % Liqd solution  Commonly known as:  CPC / CETYLPYRIDINIUM CHLORIDE 0.05%  7 mLs by Mouth Rinse route 2 times daily at 12 noon and 4 pm.     aspirin 81 MG chewable tablet  Chew 1 tablet (81 mg total) by mouth daily.     atorvastatin 80 MG tablet  Commonly known as:  LIPITOR  Take 1 tablet (80 mg total) by mouth daily at 6 PM.     b complex-vitamin c-folic acid 0.8 MG Tabs tablet  Take 1 tablet by mouth daily.     cefTRIAXone 40 MG/ML IVPB  Commonly known as:  ROCEPHIN  Inject 50 mLs (2 g total) into the vein daily.     chlorhexidine 0.12 % solution  Commonly known as:  PERIDEX  15 mLs by Mouth Rinse route 2 (two) times daily.     cinacalcet 90 MG tablet  Commonly known as:  SENSIPAR  Take 90 mg by mouth daily.     dextrose 10 % infusion  /hr     diltiazem 100 mg in dextrose 5 % 100 mL  Inject 5-15 mg/hr into the vein continuous.     diphenhydrAMINE 25 mg capsule  Commonly known as:  BENADRYL  Take 25 mg by mouth every 6 (six) hours as needed for itching.     doxercalciferol 4 MCG/2ML  injection  Commonly known as:  HECTOROL  Inject 7 mLs (14 mcg total) into the vein every Monday, Wednesday, and Friday with hemodialysis.     feeding supplement (NEPRO CARB STEADY) Liqd  Take 237 mLs by mouth 2 (two) times daily between meals.     fentaNYL 0.05 MG/ML injection  Commonly known as:  SUBLIMAZE  Inject 0.5-2 mLs (25-100 mcg total) into the vein every 2 (two) hours as needed for severe pain.     heparin 100-0.45 UNIT/ML-% infusion  Inject 2,000 Units/hr into the vein continuous.     hydrALAZINE 20 MG/ML injection  Commonly known as:  APRESOLINE  Inject 0.5 mLs (10 mg total) into the vein every 6 (six) hours as needed (BP > 170).     hydrocortisone sodium succinate 100 MG Solr injection  Commonly known as:  SOLU-CORTEF  Inject 1 mL (50 mg total) into the vein every 6 (six) hours.     LORazepam 2 MG/ML injection  Commonly known as:  ATIVAN  Inject 0.5 mLs (1 mg total) into the vein every 4 (four) hours as needed.     ondansetron 4  MG/2ML Soln injection  Commonly known as:  ZOFRAN  Inject 2 mLs (4 mg total) into the vein every 6 (six) hours as needed for nausea or vomiting.     sevelamer 800 MG tablet  Commonly known as:  RENAGEL  Take 800 mg by mouth 4 (four) times daily.       Disposition and follow-up:   [name] [name] was discharged from [facility] in Stable condition.  At the hospital follow up visit please address:  ESRD on HD--HD usually MWF, will need HD 1/29. Follow up renal function and electolytes Hypoglycemia--on D10%, need to advance diet, currently dysphagia 2 nectar thick liquid, may benefit from cognitive-linguistic eval if needed.  Continue to monitor CBGs and treat accordingly.  Substance abuse--cocaine +, needs to stop, continued cessation counseling Cardiac arrest--cardiology following. Not currently candidate for ICD.  Anoxic encephalopathy--continue to monitor, may need further neurological work up if no improvement New  Afib--back to NSR. Currently on dilt gtt and amiodarone with heparin gtt. Not candidate for NOAC. May need coumadin, but hx of non-compliance. Please assess. Monitor heparin level and CBC.  Adrenal insufficiency--in solucortef, titrate dose as appropriate.  Line assessment, RIJ present since 1/16--consider d/c Anemia--Trend CBC  2.  Labs / imaging needed at time of follow-up: Renal function panel, cbc, heparin levels  3.  Pending labs/ test needing follow-up: May consider repeat CT head if no improvement in mental status  Follow-up Appointments: Needs PCP, Renal. Likely will need cardiology and possibly neurology follow up.   Discharge Instructions: D/C to select  Consultations: Nephrology, Neurology, and Cardiology  Procedures Performed:  Dg Abd 1 View  01/19/2015   CLINICAL DATA:  Distended abdomen  EXAM: ABDOMEN - 1 VIEW  COMPARISON:  01/18/2015  FINDINGS: The upper abdomen is excluded from the field of view.  There is gaseous distension of the small bowel and colon. There is no evidence of pneumoperitoneum, portal venous gas or pneumatosis. There are no pathologic calcifications along the expected course of the ureters.The osseous structures are unremarkable.  IMPRESSION: Mild gaseous distention of small bowel and colon. No evidence of bowel obstruction. This may reflect an ileus.   Electronically Signed   By: Elige Ko   On: 01/19/2015 11:11   Ct Head Wo Contrast  01/12/2015   CLINICAL DATA:  Cerebral anoxia following cardiac arrest.  EXAM: CT HEAD WITHOUT CONTRAST  TECHNIQUE: Contiguous axial images were obtained from the base of the skull through the vertex without intravenous contrast.  COMPARISON:  01/09/2015  FINDINGS: There is no evidence of acute cortical infarct, intracranial hemorrhage, mass, midline shift, or extra-axial fluid collection. Gray-white differentiation is preserved without clear evidence of cerebral edema. Ventricles and sulci are unchanged in size and  configuration.  Mastoid air cells are clear. Endotracheal and enteric tubes are partially visualized. Fluid is present in the left frontal sinus, multiple left ethmoid air cells, sphenoid sinuses, and nasal cavity. Orbits are unremarkable.  IMPRESSION: No acute intracranial abnormality identified.   Electronically Signed   By: Sebastian Ache   On: 01/12/2015 14:36   Ct Head Wo Contrast  01/09/2015   CLINICAL DATA:  Post code.  Question anoxic brain injury.  EXAM: CT HEAD WITHOUT CONTRAST  TECHNIQUE: Contiguous axial images were obtained from the base of the skull through the vertex without intravenous contrast.  COMPARISON:  None.  FINDINGS: No acute intracranial abnormality. Specifically, no hemorrhage, hydrocephalus, mass lesion, acute infarction, or significant intracranial injury. No acute calvarial abnormality.  No current CT evidence or not brain injury.  Extensive soft tissue thickening within the nasal airway and ethmoid air cells as well as maxillary sinuses and sphenoid sinus. Air-fluid levels in the sphenoid sinuses. Mastoid air cells are clear.  IMPRESSION: No acute intracranial abnormality.   Electronically Signed   By: Charlett Nose M.D.   On: 01/09/2015 14:40   Dg Chest Port 1 View  01/03/2015   CLINICAL DATA:  Subsequent encounter for respiratory failure.  EXAM: PORTABLE CHEST - 1 VIEW  COMPARISON:  One day prior  FINDINGS: Right internal jugular line tip at high SVC. Midline trachea. Moderate cardiomegaly. Left costophrenic angle and left lung base partially excluded laterally. No definite pleural fluid. No pneumothorax. Mild pulmonary venous congestion. Improved interstitial edema. No lobar consolidation.  IMPRESSION: Cardiomegaly with slight improvement in interstitial edema/ pulmonary venous congestion.   Electronically Signed   By: Jeronimo Greaves M.D.   On: 01/07/2015 07:53   Dg Chest Port 1 View  01/20/2015   CLINICAL DATA:  Polysubstance abuse. End-stage renal disease. Pulled out  endotracheal tube.  EXAM: PORTABLE CHEST - 1 VIEW  COMPARISON:  01/19/2015  FINDINGS: Endotracheal tube is no longer visible. The nasogastric tube extends below the diaphragm and off the inferior edge of the image. The right jugular central line is unchanged in position with tip in the expected location of the SVC -azygos vein junction.  There is no pneumothorax. Mild interstitial thickening or fluid persists without significant interval change.  IMPRESSION: Extubated.  No other significant interval change.   Electronically Signed   By: Ellery Plunk M.D.   On: 01/20/2015 09:06   Dg Chest Port 1 View  01/19/2015   CLINICAL DATA:  Intubation.  EXAM: PORTABLE CHEST - 1 VIEW  COMPARISON:  01/18/2015.  FINDINGS: Endotracheal tube tip 6.5 cm above the carina. Tip is at the thoracic inlet in unchanged position. Right IJ tube NG tube in stable position. Stable cardiomegaly with mild bilateral unchanged interstitial prominence. Bibasilar subsegmental atelectasis. No pleural effusion or pneumothorax.  IMPRESSION: 1. Endotracheal tube in unchanged position with its tip at the thoracic inlet 6.5 cm above the carina. Right IJ line and NG tube in stable position. 2. Persistent cardiomegaly with bilateral mild interstitial prominence. Subsegmental atelectasis noted in the bases. No significant change from prior exam.   Electronically Signed   By: Maisie Fus  Register   On: 01/19/2015 07:13   Dg Chest Port 1 View  01/18/2015   CLINICAL DATA:  Acute respiratory failure.  EXAM: PORTABLE CHEST - 1 VIEW  COMPARISON:  01/17/2015.  FINDINGS: Interim placement endotracheal tube. Its tip is 7 cm above the carina. Interim placement of right IJ line. Its tip is in the lower portion superior vena cava. NG tube in stable position. Cardiomegaly. Interim partial clearing of bilateral pulmonary infiltrates/edema. Interim partial clearing of left pleural effusion. No pneumothorax. No acute osseus abnormality.  IMPRESSION: 1. Interim  placement of endotracheal tube. Its tip is 7 cm above the carina. Interim placement of right IJ line. Its tip is in the lower portion superior vena cava. NG tube in stable position. 2. Persistent severe cardiomegaly. 3. Interim partial clearing of bilateral pulmonary infiltrates/edema. Interim partial clearing of left pleural effusion.   Electronically Signed   By: Maisie Fus  Register   On: 01/18/2015 07:30   Dg Chest Port 1 View  01/17/2015   CLINICAL DATA:  Respiratory failure  EXAM: PORTABLE CHEST - 1 VIEW  COMPARISON:  01/15/2015  FINDINGS: Nasogastric  tube is in the stomach. Cardiac enlargement stable. There is a moderate left pleural effusion. Mild interstitial edema is unchanged. There is new atelectasis in the right base.  IMPRESSION: Stable pulmonary edema.  New right base atelectasis.   Electronically Signed   By: Signa Kell M.D.   On: 01/17/2015 07:21   Dg Chest Port 1 View  01/15/2015   CLINICAL DATA:  Respiratory failure  EXAM: PORTABLE CHEST - 1 VIEW  COMPARISON:  01/14/2015  FINDINGS: There is an endotracheal tube with tip just below the clavicular heads. A gastric suction tube reaches the stomach. Right IJ catheter, tip at the SVC level.  Improved aeration with the lower lungs better inflated. Pulmonary edema is resolved and pleural effusions or atelectasis have decreased.  IMPRESSION: 1. Stable positioning of tubes and central line. 2. Improved pulmonary inflation and clearing pulmonary edema.   Electronically Signed   By: Tiburcio Pea M.D.   On: 01/15/2015 04:41   Dg Chest Port 1 View  01/14/2015   CLINICAL DATA:  54 year old hypertensive male with end-stage renal disease. Assess endotracheal tube. Subsequent encounter.  EXAM: PORTABLE CHEST - 1 VIEW  COMPARISON:  01/13/2015  FINDINGS: Rotation to the right. Right costophrenic angle not included on present exam.  Endotracheal tube tip 3.8 cm above the carina. Right central line tip proximal superior vena cava level. Nasogastric tube  courses below the diaphragm. Tip is not included on the present exam.  Cardiomegaly.  Pulmonary vascular congestion/mild pulmonary edema.  Basilar consolidation greater on the left unchanged.  Tortuous aorta  IMPRESSION: Overall no significant change.  Cardiomegaly.  Pulmonary vascular congestion/ mild pulmonary edema pulmonary edema.  Basilar consolidation greater on the left unchanged.  Tortuous aorta   Electronically Signed   By: Bridgett Larsson M.D.   On: 01/14/2015 07:22   Dg Chest Port 1 View  01/13/2015   CLINICAL DATA:  Respiratory failure .  EXAM: PORTABLE CHEST - 1 VIEW  COMPARISON:  01/12/2015.  FINDINGS: Tracheostomy tube and NG tube in stable position. Right IJ line stable position. Persistent severe cardiomegaly with bilateral pulmonary alveolar infiltrates and pleural effusions consistent with congestive heart failure.  IMPRESSION: 1. Lines and tubes in stable position. 2. Persistent severe cardiomegaly with bilateral pulmonary alveolar infiltrates and pleural effusions consistent with congestive heart failure. Superimposed pneumonia cannot be excluded. No interim change.   Electronically Signed   By: Maisie Fus  Register   On: 01/13/2015 07:38   Dg Chest Port 1 View  01/12/2015   CLINICAL DATA:  Cardiac arrest.  EXAM: PORTABLE CHEST - 1 VIEW  COMPARISON:  01/11/2015 .  FINDINGS: Lines and tubes in stable position. Cardiomegaly. Bilateral pulmonary alveolar infiltrates again noted. Small bilateral pleural effusions. No pneumothorax.  IMPRESSION: 1. Lines and tubes in stable position. 2. Persistent severe cardiomegaly. Bilateral pulmonary infiltrates most likely secondary to congestive heart failure and pulmonary edema. Small bilateral pleural effusions are present . No significant interim change.   Electronically Signed   By: Maisie Fus  Register   On: 01/12/2015 07:20   Dg Chest Port 1 View  01/11/2015   CLINICAL DATA:  Acute respiratory failure.  EXAM: PORTABLE CHEST - 1 VIEW  COMPARISON:  01/10/2015   FINDINGS: There is an endotracheal tube with tip at the clavicular heads. A gastric suction tube reaches the diaphragm. Stable positioning of right IJ central line, tip at the SVC.  Unchanged cardiomegaly.  Unchanged upper mediastinal contours.  Central pulmonary venous congestion. Increased hazy lower lung opacities suggesting layering  pleural fluid. There are probable superimposed lower lung opacities. No pneumothorax.  IMPRESSION: 1. Unchanged positioning of tubes and central line. 2. Increasing or layering bilateral pleural effusions. Pulmonary venous congestion and basilar lung opacification is unchanged.   Electronically Signed   By: Tiburcio Pea M.D.   On: 01/11/2015 02:55   Dg Chest Port 1 View  01/10/2015   CLINICAL DATA:  Subsequent encounter for altered mental status. History of hypertension.  EXAM: PORTABLE CHEST - 1 VIEW  COMPARISON:  01/09/2015  FINDINGS: Endotracheal tube difficult to visualize centrally. Nasogastric extends beyond the inferior aspect of the film. Right internal jugular line tip mid SVC.  Numerous leads and wires project over the chest. Cardiomegaly accentuated by AP portable technique. Developing left greater than right pleural effusions. No pneumothorax. Interstitial edema is moderate. Progressive.  IMPRESSION: Worsened aeration, with increased congestive heart failure, developing pleural effusions, and left greater than right bibasilar airspace disease.   Electronically Signed   By: Jeronimo Greaves M.D.   On: 01/10/2015 09:30   Dg Chest Port 1 View  01/09/2015   CLINICAL DATA:  Central line placement  EXAM: PORTABLE CHEST - 1 VIEW  COMPARISON:  01/09/2015  FINDINGS: Right internal jugular central line is in place with the tip in the SVC. No pneumothorax. Endotracheal tube and NG tube remain in place, unchanged. Cardiomegaly. Vascular congestion. No real change since prior study.  IMPRESSION: Right central line tip in the SVC.  No pneumothorax.   Electronically Signed   By:  Charlett Nose M.D.   On: 01/09/2015 15:20   Dg Chest Portable 1 View  01/09/2015   CLINICAL DATA:  Cardiac arrest.  Intubated.  EXAM: PORTABLE CHEST - 1 VIEW  COMPARISON:  12/02/2013.  FINDINGS: Endotracheal tube in satisfactory position. Nasogastric tube extending into the stomach. Stable enlarged cardiac silhouette. Support apparatus obscuring portions of the chest with no gross lung infiltrates. There is a linear lucency overlying the mediastinum on the left. No definite pneumothorax seen.  IMPRESSION: Limited examination with a linear artifact or pneumomediastinum noted on the left.   Electronically Signed   By: Gordan Payment M.D.   On: 01/09/2015 14:27   Dg Abd Portable 1v  01/18/2015   CLINICAL DATA:  Nasogastric tube repositioning. Check tip placement. Initial encounter.  EXAM: PORTABLE ABDOMEN - 1 VIEW  COMPARISON:  Abdominal radiograph performed 01/11/2015, and chest radiograph performed earlier today at 5:06 a.m.  FINDINGS: The patient's enteric tube is seen ending overlying the body of the stomach, with the side port just above the gastroesophageal junction. This could be twisted slightly and advanced perhaps 3-5 cm, as deemed clinically appropriate.  The visualized bowel gas pattern is grossly unremarkable. No free intra-abdominal air is identified, though evaluation for free air is limited on a single supine view. No acute osseous abnormalities are seen.  IMPRESSION: Enteric tube seen ending overlying the body of the stomach, with the side-port just above the gastroesophageal junction. This could be twisted slightly and advanced perhaps 3-5 cm, as deemed clinically appropriate.   Electronically Signed   By: Roanna Raider M.D.   On: 01/18/2015 17:27   Dg Abd Portable 1v  01/11/2015   CLINICAL DATA:  Ileus  EXAM: PORTABLE ABDOMEN - 1 VIEW  COMPARISON:  None.  FINDINGS: There is nonspecific nonobstructive small bowel gas pattern. Mild colonic distension with gas and some stool probable mild colonic  ileus. Some gas noted within stomach. NG tube in place. The left colon is not distended.  IMPRESSION: Nonobstructed small bowel gas pattern. Mild colonic distension with gas and stool probable mild ileus. Some gas noted within stomach. NG tube in place PICC   Electronically Signed   By: Natasha Mead M.D.   On: 01/11/2015 10:38   2D Echo:  [facility]*         *[facility]*            [address]            [place], [place] [address]              [phone]  ------------------------------------------------------------------- Transthoracic Echocardiography  Patient:  Zaccary, Creech MR #:    [id number] Study Date: 01/10/2015 Gender:   M Age:    53 Height:   188 cm Weight:   135.2 kg BSA:    2.71 m^2 Pt. Status: Room:    [facility]  ADMITTING  Everette Rank, MD ATTENDING  Leland Johns G SONOGRAPHER Delcie Roch, RDCS, CCT PERFORMING  [facility], Inpatient  cc:  ------------------------------------------------------------------- LV EF: 55% -  60%  ------------------------------------------------------------------- Indications:   Cardiac arrest 427.5.  ------------------------------------------------------------------- History:  PMH:  Coronary artery disease. Risk factors: End stage renal disease. Polysubstance abuse. Current tobacco use. Hypertension.  ------------------------------------------------------------------- Study Conclusions  - Left ventricle: The cavity size was normal. Wall thickness was increased in a pattern of moderate LVH. Systolic function was normal. The estimated ejection fraction was in the range of 55% to 60%. Wall motion was normal; there were no regional wall motion abnormalities. Doppler parameters are consistent with a reversible restrictive pattern, indicative of decreased left ventricular diastolic  compliance and/or increased left atrial pressure (grade 3 diastolic dysfunction). - Mitral valve: Severely calcified annulus. There was mild regurgitation. Valve area by pressure half-time: 2 cm^2. - Left atrium: The atrium was severely dilated. - Tricuspid valve: There was mild-moderate regurgitation. - Pulmonary arteries: Systolic pressure was moderately increased. PA peak pressure: 52 mm Hg (S).  Transthoracic echocardiography. M-mode, complete 2D, spectral Doppler, and color Doppler. Birthdate: Patient birthdate: 1961/12/09. Age: Patient is 54 yr old. Sex: Gender: male. BMI: 38.3 kg/m^2. Blood pressure:   167/80 Patient status: Inpatient. Study date: Study date: 01/10/2015. Study time: 08:21 AM. Location: ICU/CCU  -------------------------------------------------------------------  ------------------------------------------------------------------- Left ventricle: The cavity size was normal. Wall thickness was increased in a pattern of moderate LVH. Systolic function was normal. The estimated ejection fraction was in the range of 55% to 60%. Wall motion was normal; there were no regional wall motion abnormalities. Doppler parameters are consistent with a reversible restrictive pattern, indicative of decreased left ventricular diastolic compliance and/or increased left atrial pressure (grade 3 diastolic dysfunction).  ------------------------------------------------------------------- Aortic valve:  Trileaflet; normal thickness, mildly calcified leaflets. Mobility was not restricted. Doppler: Transvalvular velocity was within the normal range. There was no stenosis. There was no regurgitation.  ------------------------------------------------------------------- Aorta: Aortic root: The aortic root was normal in size.  ------------------------------------------------------------------- Mitral valve:  Severely calcified annulus. Mobility was  not restricted. Doppler: Transvalvular velocity was within the normal range. There was no evidence for stenosis. There was mild regurgitation.  Valve area by pressure half-time: 2 cm^2. Indexed valve area by pressure half-time: 0.74 cm^2/m^2.  Peak gradient (D): 11 mm Hg.  ------------------------------------------------------------------- Left atrium: The atrium was severely dilated.  ------------------------------------------------------------------- Right ventricle: Not well visualized. The cavity size was normal. Wall thickness was normal. Systolic function was normal.  ------------------------------------------------------------------- Pulmonic valve:  Not well visualized. The valve appears to  be grossly normal.  Doppler: Transvalvular velocity was within the normal range. There was no evidence for stenosis. There was mild regurgitation.  ------------------------------------------------------------------- Tricuspid valve:  Structurally normal valve.  Doppler: Transvalvular velocity was within the normal range. There was mild-moderate regurgitation.  ------------------------------------------------------------------- Pulmonary artery:  The main pulmonary artery was normal-sized. Systolic pressure was moderately increased.  ------------------------------------------------------------------- Right atrium: Not well visualized. The atrium was normal in size.  ------------------------------------------------------------------- Pericardium: There was no pericardial effusion.  ------------------------------------------------------------------- Systemic veins: Inferior vena cava: The vessel was normal in size.  ------------------------------------------------------------------- Measurements  Left ventricle             Value     Reference LV ID, ED, PLAX chordal    (H)   58  mm    43 - 52 LV ID, ES, PLAX chordal    (H)    42  mm    23 - 38 LV fx shortening, PLAX chordal (L)   28  %    >=29 LV PW thickness, ED          16  mm    --------- IVS/LV PW ratio, ED          0.81      <=1.3 LV e&', lateral             8.3  cm/s   --------- LV E/e&', lateral            19.64     --------- LV e&', medial             6.7  cm/s   --------- LV E/e&', medial            24.33     --------- LV e&', average             7.5  cm/s   --------- LV E/e&', average            21.73     ---------  Ventricular septum           Value     Reference IVS thickness, ED           13  mm    ---------  LVOT                  Value     Reference LVOT ID, S               25  mm    --------- LVOT area               4.91 cm^2   ---------  Aorta                 Value     Reference Aortic root ID, ED           35  mm    ---------  Left atrium              Value     Reference LA ID, A-P, ES             57  mm    --------- LA ID/bsa, A-P             2.11 cm/m^2  <=2.2 LA volume, S              163  ml    --------- LA volume/bsa, S            60.2 ml/m^2  --------- LA volume, ES, 1-p A4C  162  ml    --------- LA volume/bsa, ES, 1-p A4C       59.9 ml/m^2  --------- LA volume, ES, 1-p A2C         149  ml    --------- LA volume/bsa, ES, 1-p A2C       55.1 ml/m^2  ---------  Mitral valve              Value     Reference Mitral E-wave peak velocity      163  cm/s   --------- Mitral A-wave peak velocity      70.1 cm/s   --------- Mitral deceleration time    (H)   250  ms    150 -  230 Mitral pressure half-time       113  ms    --------- Mitral peak gradient, D        11  mm Hg  --------- Mitral E/A ratio, peak         2.3      --------- Mitral valve area, PHT, DP       2   cm^2   --------- Mitral valve area/bsa, PHT, DP     0.74 cm^2/m^2 ---------  Pulmonary arteries           Value     Reference PA pressure, S, DP       (H)   52  mm Hg  <=30  Tricuspid valve            Value     Reference Tricuspid regurg peak velocity     333  cm/s   --------- Tricuspid peak RV-RA gradient     44  mm Hg  ---------  Systemic veins             Value     Reference Estimated CVP             8   mm Hg  ---------  Right ventricle            Value     Reference RV pressure, S, DP       (H)   52  mm Hg  <=30 RV s&', lateral, S           17.5 cm/s   ---------  Legend: (L) and (H) mark values outside specified reference range.  ------------------------------------------------------------------- Prepared and Electronically Authenticated by  Georga HackingW. Spencer Tilley, MD [facility] 2016-01-17T13:16:56  Admission HPI: 54 yo ESRD , poorly compliant with HD MWF who noted to be slumped over at home. EMS activated and 10 minutes of CPR along with one shock applied. Intubated in the field and [facility] and deemed a hypothermia candidate. Was not taken to Cath lab.   Hospital Course by problem list: Principal Problem:   Cardiac arrest Active Problems:   HTN (hypertension)   End stage renal disease   Polysubstance abuse   Acute respiratory failure with hypoxemia   Cardiogenic shock   Anoxic encephalopathy   Acute respiratory failure with hypoxia   A-fib   Hypoglycemia   Normocytic anemia   Adrenal insufficiency   Acute respiratory failure s/p VT arrest--intubated per EMS  1/16 and extubated 1/27. He was on hypothermia protocol and successfully warmed. Transitioned to [place] and weaning down.  He also has improving pulmonary edema and ongoing HD. No clear infectious process but he did have significant purulent secretions. Leukocytosis is trending down (in setting of steroids) and no longer febrile.  Initially on Zosyn, and transitioned to Ceftriaxone with anticipated stop date 1/30. Procalcitonin 2.34--->1.9-->1.69.   Cardiac arrest--s/p VT/VF shocked x 3 on admission, UDS positive for cocaine.  He was found sitting in chair slumped over and unresponsive.  Estimated approximately 10 minutes prior to initiation of CPR and resuscitated with defibrillation.  Subsequent arrest as well requiring epinephrine and defibrillated again in the ER. ECG from EMS revealed inferior ST elevations and lateral ST depressions. Code STEMI was initiated.  Cardiology was consulted who noted similar pattern of inferior Q waves and ST elevation in inferior leads on prior ECGs.  Thus, given doubt of neurologic recovery and comparison of prior ECGs, he was not taken for emergent catheterization.  Of note, cardiac cath in 2014 noted to show stenosis in diagonal branch per cardiology note with normal LV function at that time. He has been medically managed this admission.  Cardiac enzymes trended down from 0.8 to 0.69.  Arterial line was placed that has since then been discontinued.  Central venous catheter is still in place.  2D echo done 01/10/15 showed moderate LVH, EF 55-60%, grade 3 diastolic dysfunction, and PA peak pressure of .  Given concern for compliance and continued cocaine use, not considered to be a candidate for ICD at this time.  Cocaine cessation is strongly encouraged.  He is currently on daily Aspirin and Lipitor.  Cardiology will need to follow and will also likely need outpatient follow up.   New onset Afib RVR in setting of diastolic CHF, HTN, and new cardiac arrest this  admission--developed during hospital course and back to NSR 1/28.  Cardiology is following.  CHADS 2 VASC score of 2 and ESRD on HD patient.  He was started on Heparin gtt, Cardizem gtt, and Amiodarone gtt that are still continued.  He is not deemed a candidate for NOAC therapy.  Coumadin may be challenging given hx of non-compliance but possible if he agrees.  Please address anticoagulation.  Of note, he is on Norvasc 5mg  at home which is currently being held given Cardizem infusion.  Cardiology currently following.   HTN--on Norvasc at home.  Controlled by Cardizem.   ESRD on HD--MWF.  Renal has been following this admission with HD sessions as scheduled. Next session due 01/22/15.  Electrolytes have been corrected as needed.  Please continue to follow renal function panel and HD sessions per scheduled.  Home medications include Nephrovite, Sensipar, and Renagel.   Ileus--per abdominal xray 01/19/15 with emesis and diarrhea during hospital course.  Xray showed mild gaseous distention of small bowel and colon with no evidence of obstruction.  NGT was placed to interittent suction and condition improved.  He was also on PPI while intubated.  Cdiff was negative.  Emesis has resolved and diarrhea has improved during hospital course.  He was briefly on Reglan which has since then been discontinued.  SLP was consulted and advanced diet to Dysphagia 2 with nectar thick liquid.    Normocytic Anemia--likely of chronic disease in setting of ESRD.  Hb 10.9 on discharge.  Currently on heparin infusion. CBC will need to be followed.    Hypoglycemia--recurrent during hospital course.  Started on D10 infusion, currently at 60cc/hr with frequent CBG monitoring that will need to continue.  He is also on stress dose steroids and diet was advanced on day of discharge. Please follow up blood glucose levels and adjust treatment as needed.     Adrenal insufficiency--cortisol level was 13.9.  He was started on Solucortef 50mg   q6h 01/20/15.  Please address steroids on follow up and adjust dose as needed.   Significant anoxic encephalopathy s/p arrest--presumed brain injury but some slow interval improvement during hospital course.  He has been successfully intubated.  CT head on 1/16 did not show any acute intracranial abnormality and repeat CT head on 1/19 was also negative.  Neurology was consulted during admission.  EEG was abnormal due to generalized slowing indicating moderate to severe cerebral dysfunction.  Intermittent sharp wave activity noted in b/l posterior region.  EEG was technically limited due to diffuse muscle artifact.  He was started on Depakote that was later discontinued due to somnolence and no reported seizure activity.  Last Valproic acid level was 47.7.  Unclear baseline mental status but has intermittent agitation and confusion.  Mental status assessment will need to continue on follow up. May need further neurology work up or follow up.  Physical therapy has been consulted and he will need continued PT services.  He is currently on ativan as needed and this will likely need to be adjusted or discontinued depending on mental status.   Substance abuse--UDS positive for cocaine. Cessation strongly encouraged.   Discharge Vitals:   BP 169/74 mmHg  Pulse 81  Temp(Src) 97.6 F (36.4 C) (Oral)  Resp 25  Ht 6\' 2"  (1.88 m)  Wt 282 lb 3 oz (128 kg)  BMI 36.22 kg/m2  SpO2 92%  Discharge Labs:  Results for orders placed or performed during the hospital encounter of 01/09/15 (from the past 24 hour(s))  Glucose, capillary     Status: None   Collection Time: 01/20/15  5:35 PM  Result Value Ref Range   Glucose-Capillary 74 70 - 99 mg/dL   Comment 1 Capillary Sample   CBC     Status: Abnormal   Collection Time: 01/20/15  6:47 PM  Result Value Ref Range   WBC 14.7 (H) 4.0 - 10.5 K/uL   RBC 3.79 (L) 4.22 - 5.81 MIL/uL   Hemoglobin 11.0 (L) 13.0 - 17.0 g/dL   HCT 16.1 (L) 09.6 - 04.5 %   MCV 85.5 78.0  - 100.0 fL   MCH 29.0 26.0 - 34.0 pg   MCHC 34.0 30.0 - 36.0 g/dL   RDW 40.9 81.1 - 91.4 %   Platelets 224 150 - 400 K/uL  Renal function panel     Status: Abnormal   Collection Time: 01/20/15  6:47 PM  Result Value Ref Range   Sodium 135 135 - 145 mmol/L   Potassium 3.6 3.5 - 5.1 mmol/L   Chloride 94 (L) 96 - 112 mmol/L   CO2 25 19 - 32 mmol/L   Glucose, Bld 119 (H) 70 - 99 mg/dL   BUN 70 (H) 6 - 23 mg/dL   Creatinine, Ser 78.29 (H) 0.50 - 1.35 mg/dL   Calcium 56.2 8.4 - 13.0 mg/dL   Phosphorus 8.7 (H) 2.3 - 4.6 mg/dL   Albumin 2.3 (L) 3.5 - 5.2 g/dL   GFR calc non Af Amer 5 (L) >90 mL/min   GFR calc Af Amer 6 (L) >90 mL/min   Anion gap 16 (H) 5 - 15  Hepatitis B surface antigen     Status: None   Collection Time: 01/20/15  6:47 PM  Result Value Ref Range   Hepatitis B Surface Ag NEGATIVE NEGATIVE  Glucose, capillary     Status: Abnormal   Collection Time: 01/12/2015 12:00 AM  Result Value Ref Range   Glucose-Capillary 47 (L) 70 - 99  mg/dL   Comment 1 Capillary Sample   Heparin level (unfractionated)     Status: Abnormal   Collection Time: 02/10/2015 12:27 AM  Result Value Ref Range   Heparin Unfractionated <0.10 (L) 0.30 - 0.70 IU/mL  Glucose, capillary     Status: None   Collection Time: 02-10-2015 12:47 AM  Result Value Ref Range   Glucose-Capillary 86 70 - 99 mg/dL   Comment 1 Capillary Sample   Glucose, capillary     Status: Abnormal   Collection Time: February 10, 2015  4:28 AM  Result Value Ref Range   Glucose-Capillary 101 (H) 70 - 99 mg/dL   Comment 1 Venous Sample   CBC     Status: Abnormal   Collection Time: 2015-02-10  4:30 AM  Result Value Ref Range   WBC 14.2 (H) 4.0 - 10.5 K/uL   RBC 3.69 (L) 4.22 - 5.81 MIL/uL   Hemoglobin 10.9 (L) 13.0 - 17.0 g/dL   HCT 09.8 (L) 11.9 - 14.7 %   MCV 87.8 78.0 - 100.0 fL   MCH 29.5 26.0 - 34.0 pg   MCHC 33.6 30.0 - 36.0 g/dL   RDW 82.9 56.2 - 13.0 %   Platelets 228 150 - 400 K/uL  Renal function panel     Status: Abnormal    Collection Time: 02-10-2015  4:30 AM  Result Value Ref Range   Sodium 136 135 - 145 mmol/L   Potassium 3.4 (L) 3.5 - 5.1 mmol/L   Chloride 97 96 - 112 mmol/L   CO2 28 19 - 32 mmol/L   Glucose, Bld 120 (H) 70 - 99 mg/dL   BUN 35 (H) 6 - 23 mg/dL   Creatinine, Ser 8.65 (H) 0.50 - 1.35 mg/dL   Calcium 9.7 8.4 - 78.4 mg/dL   Phosphorus 5.5 (H) 2.3 - 4.6 mg/dL   Albumin 2.4 (L) 3.5 - 5.2 g/dL   GFR calc non Af Amer 8 (L) >90 mL/min   GFR calc Af Amer 9 (L) >90 mL/min   Anion gap 11 5 - 15  Magnesium     Status: None   Collection Time: Feb 10, 2015  4:30 AM  Result Value Ref Range   Magnesium 2.2 1.5 - 2.5 mg/dL  Glucose, capillary     Status: None   Collection Time: 2015/02/10  8:21 AM  Result Value Ref Range   Glucose-Capillary 73 70 - 99 mg/dL   Comment 1 Capillary Sample   Heparin level (unfractionated)     Status: Abnormal   Collection Time: 02-10-2015 10:00 AM  Result Value Ref Range   Heparin Unfractionated 0.21 (L) 0.30 - 0.70 IU/mL  Glucose, capillary     Status: Abnormal   Collection Time: Feb 10, 2015 11:12 AM  Result Value Ref Range   Glucose-Capillary 109 (H) 70 - 99 mg/dL   Comment 1 Capillary Sample    Signed: Baltazar Apo, MD 10-Feb-2015, 4:52 PM   Services Ordered on Discharge: HD Equipment Ordered on Discharge: needs continued PT

## 2015-01-21 NOTE — Progress Notes (Signed)
At 1515 pt tried to climb out of bed. Pt never made it over the side rails. 3 RNs and [facility] helped to calm pt and get back to bed. Pt agitated and Rory Percyaniel Feinstein notified to obtain order for 1mg  Ativan. 1mg  Ativan given at 1516. Will continue to monitor.

## 2015-01-21 NOTE — Progress Notes (Addendum)
Physical Therapy Treatment Patient Details Name: Elfredia Nevinsugene N Hisaw MRN: [id number] DOB: [birth year] Today's Date: April 09, 2015    History of Present Illness Pt adm after cardiac arrest. Pt + for cocaine. Pt with VDRF. PMH - ESRD on HD (poor compliance), polysubstance abuse, MI, CHF    PT Comments    Pt now extubated and able to mobilize some. Pt very restless in bed. He repeatedly moves legs over rail of bed or off side of bed. Currently cognition significantly impaired. Pt with decr coordination of bilateral upper extremities.   Follow Up Recommendations  CIR     Equipment Recommendations  Other (comment) (To be determined)    Recommendations for Other Services Rehab consult;OT consult     Precautions / Restrictions Precautions Precautions: Fall    Mobility  Bed Mobility Overal bed mobility: Needs Assistance Bed Mobility: Supine to Sit;Sit to Supine     Supine to sit: +2 for physical assistance;Max assist Sit to supine: +2 for physical assistance;Max assist   General bed mobility comments: Verbal/tactile cues for technique. Assist to elevate trunk into sitting and to scoot hips to EOB. Assist to control trunk and bring legs up into bed when returning to supine.  Transfers                    Ambulation/Gait                 Stairs            Wheelchair Mobility    Modified Rankin (Stroke Patients Only)       Balance Overall balance assessment: Needs assistance Sitting-balance support: Bilateral upper extremity supported;Feet supported Sitting balance-Leahy Scale: Poor Sitting balance - Comments: Sat EOB 7-8 minutes with +2 min/mod A. Pt with unpredictable movements in all directions. Postural control: Posterior lean;Other (comment);Right lateral lean (anterior lean)                          Cognition Arousal/Alertness: Awake/alert Behavior During Therapy: Restless;Impulsive Overall Cognitive Status: Impaired/Different from  baseline Area of Impairment: Following commands;Safety/judgement;Problem solving;Orientation;Attention Orientation Level: Place;Time;Situation Current Attention Level: Focused   Following Commands: Follows one step commands inconsistently Safety/Judgement: Decreased awareness of safety;Decreased awareness of deficits   Problem Solving: Slow processing;Requires verbal cues;Requires tactile cues General Comments: Pt with perseverating on finding his phone and calling someone. Repeatedly reoriented pt.     Exercises      General Comments        Pertinent Vitals/Pain Pain Assessment: No/denies pain    Home Living Family/patient expects to be discharged to:: Skilled nursing facility Living Arrangements: Alone   Type of Home: Apartment Home Access: Level entry   Home Layout: One level Home Equipment: Cane - single point      Prior Function Level of Independence: Independent          PT Goals (current goals can now be found in the care plan section) Progress towards PT goals:  (new goals added)    Frequency  Min 3X/week    PT Plan Discharge plan needs to be updated;Frequency needs to be updated    Co-evaluation             End of Session Equipment Utilized During Treatment: Oxygen Activity Tolerance: Patient tolerated treatment well Patient left: in bed;with call bell/phone within reach;with bed alarm set     Time: 1129-1141 PT Time Calculation (min) (ACUTE ONLY): 12 min  Charges:  $Therapeutic Activity: 8-22 mins  G Codes:      Jonnie Kubly 01/22/2015, 1:22 PM  [facility] PT [phone]

## 2015-01-21 NOTE — Progress Notes (Signed)
Rehab Admissions Coordinator Note:  Patient was screened by Clois DupesBoyette, Gladine Plude Godwin for appropriateness for an Inpatient Acute Rehab Consult per PT and SLP recommendation.  At this time, we are recommending Inpatient Rehab consult if you feel appropriate for assessment.   Clois DupesBoyette, Blimy Napoleon Godwin 10-10-15, 2:58 PM  I can be reached at [phone].

## 2015-01-21 NOTE — Progress Notes (Signed)
  [facility] Progress Note   Subjective: did not have any problems with HD yest, minimal UF, CXR today with improving vasc congestion/ IS edema  Filed Vitals:   2015/04/28 0600 2015/04/28 0700 2015/04/28 0800 2015/04/28 0900  BP: 176/102 175/107 151/98 169/81  Pulse: 82 87 85 77  Temp:   98.2 F (36.8 C)   TempSrc:   Oral   Resp: 29 24 29 24   Height:      Weight:      SpO2: 91% 86% 90% 100%   Exam: Awake, responsive, still not fully oriented No jvd Chest clear ant and lat RRR no MRG Abd soft, obese, NTND, +BS LUA AVF +bruit No LE or UE edema  [facility]800 129.5kg 2/2.0 [place] Heparin 9000 LUA AVF No aranesp or Fe, Hectorol 14 ug tiw       Assessment: 1. VT/VF arrest 2. Anoxic encephalopathy  3. VDRF 4. Nutrition - tube feeds 5. ESRD MWF HD 6. HTN/ vol / mild pulm edema - BP's up, will remove fluid with HD tomorrow  Plan-  HD tomorrow, US 3-4 kg    Vinson Moselleob Caroll Cunnington MD  pager [phone]    cell [phone]  September 13, 2015, 10:09 AM     Recent Labs Lab 01/16/15 0445  01/18/15 0345 01/20/15 1847 2015/04/28 0430  NA 139  < > 141 135 136  K 4.6  < > 4.8 3.6 3.4*  CL 97  < > 97 94* 97  CO2 27  < > 28 25 28   GLUCOSE 83  < > 83 119* 120*  BUN 36*  < > 107* 70* 35*  CREATININE 8.06*  < > 11.89* 10.24* 6.83*  CALCIUM 10.0  < > 10.8* 10.0 9.7  PHOS 8.4*  --   --  8.7* 5.5*  < > = values in this interval not displayed.  Recent Labs Lab 01/20/15 1847 2015/04/28 0430  ALBUMIN 2.3* 2.4*    Recent Labs Lab 01/19/15 1100 01/20/15 1847 2015/04/28 0430  WBC 8.4 14.7* 14.2*  HGB 9.6* 11.0* 10.9*  HCT 29.0* 32.4* 32.4*  MCV 85.8 85.5 87.8  PLT 180 224 228   . antiseptic oral rinse  7 mL Mouth Rinse BID  . aspirin  81 mg Oral Daily  . atorvastatin  80 mg Oral q1800  . cefTRIAXone (ROCEPHIN)  IV  2 g Intravenous Q24H  . chlorhexidine  15 mL Mouth Rinse BID  . doxercalciferol  14 mcg Intravenous Q M,W,F-HD  . hydrocortisone sod succinate  (SOLU-CORTEF) inj  50 mg Intravenous 4 times per day  . pantoprazole sodium  40 mg Per Tube Daily   . sodium chloride 10 mL/hr at 2015/04/28 0700  . amiodarone 30 mg/hr (2015/04/28 0700)  . dextrose 60 mL/hr at 2015/04/28 0700  . diltiazem (CARDIZEM) infusion 5 mg/hr (2015/04/28 0700)  . heparin 1,800 Units/hr (2015/04/28 0700)   fentaNYL, hydrALAZINE, midazolam, ondansetron (ZOFRAN) IV

## 2015-01-22 ENCOUNTER — Other Ambulatory Visit (HOSPITAL_COMMUNITY): Payer: Medicare Other

## 2015-01-22 LAB — BASIC METABOLIC PANEL
Anion gap: 14 (ref 5–15)
Anion gap: 20 — ABNORMAL HIGH (ref 5–15)
BUN: 49 mg/dL — AB (ref 6–23)
BUN: 51 mg/dL — AB (ref 6–23)
CHLORIDE: 94 mmol/L — AB (ref 96–112)
CO2: 27 mmol/L (ref 19–32)
CO2: 24 mmol/L (ref 19–32)
CREATININE: 9.42 mg/dL — AB (ref 0.50–1.35)
Calcium: 10.8 mg/dL — ABNORMAL HIGH (ref 8.4–10.5)
Calcium: 10.4 mg/dL (ref 8.4–10.5)
Chloride: 94 mmol/L — ABNORMAL LOW (ref 96–112)
Creatinine, Ser: 9.29 mg/dL — ABNORMAL HIGH (ref 0.50–1.35)
GFR calc non Af Amer: 6 mL/min — ABNORMAL LOW (ref >90)
GFR calc non Af Amer: 6 mL/min — ABNORMAL LOW (ref >90)
GFR, EST AFRICAN AMERICAN: 7 mL/min — AB (ref >90)
GFR, EST AFRICAN AMERICAN: 6 mL/min — AB (ref >90)
GLUCOSE: 106 mg/dL — AB (ref 70–99)
Glucose, Bld: 122 mg/dL — ABNORMAL HIGH (ref 70–99)
POTASSIUM: 4.6 mmol/L (ref 3.5–5.1)
Potassium: 2.7 mmol/L — CL (ref 3.5–5.1)
SODIUM: 138 mmol/L (ref 135–145)
Sodium: 135 mmol/L (ref 135–145)

## 2015-01-22 LAB — CBC
HCT: 33.9 % — ABNORMAL LOW (ref 39.0–52.0)
HEMOGLOBIN: 11.4 g/dL — AB (ref 13.0–17.0)
MCH: 29.1 pg (ref 26.0–34.0)
MCHC: 33.6 g/dL (ref 30.0–36.0)
MCV: 86.5 fL (ref 78.0–100.0)
Platelets: 405 10*3/uL — ABNORMAL HIGH (ref 150–400)
RBC: 3.92 MIL/uL — AB (ref 4.22–5.81)
RDW: 14 % (ref 11.5–15.5)
WBC: 32.5 10*3/uL — ABNORMAL HIGH (ref 4.0–10.5)

## 2015-01-22 LAB — COMPREHENSIVE METABOLIC PANEL
ALT: 24 U/L (ref 0–53)
AST: 39 U/L — ABNORMAL HIGH (ref 0–37)
Albumin: 3.1 g/dL — ABNORMAL LOW (ref 3.5–5.2)
Alkaline Phosphatase: 58 U/L (ref 39–117)
Anion gap: 16 — ABNORMAL HIGH (ref 5–15)
BUN: 44 mg/dL — ABNORMAL HIGH (ref 6–23)
CO2: 26 mmol/L (ref 19–32)
Calcium: 11.2 mg/dL — ABNORMAL HIGH (ref 8.4–10.5)
Chloride: 92 mmol/L — ABNORMAL LOW (ref 96–112)
Creatinine, Ser: 8.75 mg/dL — ABNORMAL HIGH (ref 0.50–1.35)
GFR calc Af Amer: 7 mL/min — ABNORMAL LOW (ref >90)
GFR calc non Af Amer: 6 mL/min — ABNORMAL LOW (ref >90)
GLUCOSE: 138 mg/dL — AB (ref 70–99)
Potassium: 2.7 mmol/L — CL (ref 3.5–5.1)
Sodium: 134 mmol/L — ABNORMAL LOW (ref 135–145)
Total Bilirubin: 0.7 mg/dL (ref 0.3–1.2)
Total Protein: 7.9 g/dL (ref 6.0–8.3)

## 2015-01-22 LAB — CK
CK TOTAL: 210 U/L (ref 7–232)
Total CK: 324 U/L — ABNORMAL HIGH (ref 7–232)

## 2015-01-22 LAB — MAGNESIUM
MAGNESIUM: 2.3 mg/dL (ref 1.5–2.5)
Magnesium: 2.4 mg/dL (ref 1.5–2.5)

## 2015-01-22 LAB — BLOOD GAS, ARTERIAL
Acid-base deficit: 1.1 mmol/L (ref 0.0–2.0)
BICARBONATE: 21.9 meq/L (ref 20.0–24.0)
O2 Content: 4 L/min
O2 Saturation: 96.7 %
PO2 ART: 95.2 mmHg (ref 80.0–100.0)
Patient temperature: 97.8
TCO2: 22.8 mmol/L (ref 0–100)
pCO2 arterial: 28.4 mmHg — ABNORMAL LOW (ref 35.0–45.0)
pH, Arterial: 7.496 — ABNORMAL HIGH (ref 7.350–7.450)

## 2015-01-22 LAB — PROTIME-INR
INR: 1.26 (ref 0.00–1.49)
Prothrombin Time: 15.9 seconds — ABNORMAL HIGH (ref 11.6–15.2)

## 2015-01-22 LAB — APTT: aPTT: 50 seconds — ABNORMAL HIGH (ref 24–37)

## 2015-01-22 LAB — GLUCOSE, CAPILLARY: Glucose-Capillary: 114 mg/dL — ABNORMAL HIGH (ref 70–99)

## 2015-01-22 LAB — HEPATITIS B CORE ANTIBODY, IGM: HEP B C IGM: NONREACTIVE

## 2015-01-22 LAB — TROPONIN I
Troponin I: 0.21 ng/mL — ABNORMAL HIGH (ref <0.031)
Troponin I: 0.22 ng/mL — ABNORMAL HIGH (ref <0.031)

## 2015-01-22 LAB — TSH: TSH: 4.199 u[IU]/mL (ref 0.350–4.500)

## 2015-01-22 LAB — HEPARIN LEVEL (UNFRACTIONATED)
HEPARIN UNFRACTIONATED: 0.49 [IU]/mL (ref 0.30–0.70)
Heparin Unfractionated: 0.27 IU/mL — ABNORMAL LOW (ref 0.30–0.70)

## 2015-01-22 LAB — HEPATITIS C ANTIBODY: HCV Ab: NEGATIVE

## 2015-01-23 LAB — CBC WITH DIFFERENTIAL/PLATELET
Basophils Absolute: 0 10*3/uL (ref 0.0–0.1)
Basophils Relative: 0 % (ref 0–1)
EOS ABS: 0 10*3/uL (ref 0.0–0.7)
EOS PCT: 0 % (ref 0–5)
HEMATOCRIT: 29.8 % — AB (ref 39.0–52.0)
HEMOGLOBIN: 10 g/dL — AB (ref 13.0–17.0)
Lymphocytes Relative: 6 % — ABNORMAL LOW (ref 12–46)
Lymphs Abs: 2.2 10*3/uL (ref 0.7–4.0)
MCH: 29.7 pg (ref 26.0–34.0)
MCHC: 33.6 g/dL (ref 30.0–36.0)
MCV: 88.4 fL (ref 78.0–100.0)
Monocytes Absolute: 2.6 10*3/uL — ABNORMAL HIGH (ref 0.1–1.0)
Monocytes Relative: 7 % (ref 3–12)
NEUTROS PCT: 87 % — AB (ref 43–77)
Neutro Abs: 32.5 10*3/uL — ABNORMAL HIGH (ref 1.7–7.7)
PLATELETS: 308 10*3/uL (ref 150–400)
RBC: 3.37 MIL/uL — ABNORMAL LOW (ref 4.22–5.81)
RDW: 14.4 % (ref 11.5–15.5)
WBC: 37.3 10*3/uL — ABNORMAL HIGH (ref 4.0–10.5)

## 2015-01-23 LAB — BASIC METABOLIC PANEL
Anion gap: 16 — ABNORMAL HIGH (ref 5–15)
Anion gap: 13 (ref 5–15)
BUN: 54 mg/dL — ABNORMAL HIGH (ref 6–23)
BUN: 61 mg/dL — ABNORMAL HIGH (ref 6–23)
CO2: 26 mmol/L (ref 19–32)
CO2: 26 mmol/L (ref 19–32)
Calcium: 10.4 mg/dL (ref 8.4–10.5)
Calcium: 10.2 mg/dL (ref 8.4–10.5)
Chloride: 95 mmol/L — ABNORMAL LOW (ref 96–112)
Chloride: 93 mmol/L — ABNORMAL LOW (ref 96–112)
Creatinine, Ser: 9.79 mg/dL — ABNORMAL HIGH (ref 0.50–1.35)
Creatinine, Ser: 10.83 mg/dL — ABNORMAL HIGH (ref 0.50–1.35)
GFR calc Af Amer: 5 mL/min — ABNORMAL LOW (ref >90)
GFR calc non Af Amer: 5 mL/min — ABNORMAL LOW (ref >90)
GFR calc non Af Amer: 5 mL/min — ABNORMAL LOW (ref >90)
GFR, EST AFRICAN AMERICAN: 6 mL/min — AB (ref >90)
Glucose, Bld: 91 mg/dL (ref 70–99)
Glucose, Bld: 115 mg/dL — ABNORMAL HIGH (ref 70–99)
POTASSIUM: 4.2 mmol/L (ref 3.5–5.1)
Potassium: 5 mmol/L (ref 3.5–5.1)
SODIUM: 137 mmol/L (ref 135–145)
Sodium: 132 mmol/L — ABNORMAL LOW (ref 135–145)

## 2015-01-23 LAB — HEPARIN LEVEL (UNFRACTIONATED)
HEPARIN UNFRACTIONATED: 0.38 [IU]/mL (ref 0.30–0.70)
Heparin Unfractionated: 0.54 IU/mL (ref 0.30–0.70)

## 2015-01-23 LAB — HEPATITIS B SURFACE ANTIBODY, QUANTITATIVE: Hepatitis B-Post: 74 m[IU]/mL (ref >9.9)

## 2015-01-23 LAB — CK: Total CK: 141 U/L (ref 7–232)

## 2015-01-23 LAB — TROPONIN I: Troponin I: 0.2 ng/mL — ABNORMAL HIGH (ref <0.031)

## 2015-01-25 LAB — CULTURE, RESPIRATORY W GRAM STAIN

## 2015-01-25 LAB — CULTURE, RESPIRATORY: Culture: NORMAL

## 2015-01-25 DEATH — deceased

## 2015-01-27 LAB — HEPATITIS B E ANTIBODY: HEP B E AB: NONREACTIVE

## 2015-01-28 LAB — CULTURE, BLOOD (ROUTINE X 2)
CULTURE: NO GROWTH
Culture: NO GROWTH
